# Patient Record
Sex: Male | Born: 1970
Health system: Southern US, Community
[De-identification: ages and names within clinical notes are randomized; demographics above are authoritative.]

## PROBLEM LIST (undated history)

## (undated) DIAGNOSIS — F17200 Nicotine dependence, unspecified, uncomplicated: Secondary | ICD-10-CM

## (undated) DIAGNOSIS — G4730 Sleep apnea, unspecified: Secondary | ICD-10-CM

## (undated) DIAGNOSIS — F1910 Other psychoactive substance abuse, uncomplicated: Secondary | ICD-10-CM

## (undated) DIAGNOSIS — G4700 Insomnia, unspecified: Secondary | ICD-10-CM

## (undated) DIAGNOSIS — G4710 Hypersomnia, unspecified: Secondary | ICD-10-CM

## (undated) DIAGNOSIS — F32A Depression, unspecified: Secondary | ICD-10-CM

## (undated) DIAGNOSIS — F419 Anxiety disorder, unspecified: Secondary | ICD-10-CM

## (undated) HISTORY — DX: Anxiety disorder, unspecified: F41.9

## (undated) HISTORY — DX: Other psychoactive substance abuse, uncomplicated: F19.10

## (undated) HISTORY — DX: Nicotine dependence, unspecified, uncomplicated: F17.200

## (undated) HISTORY — DX: Insomnia, unspecified: G47.00

## (undated) HISTORY — DX: Hypersomnia, unspecified: G47.10

## (undated) HISTORY — PX: KNEE SURGERY: SHX244

## (undated) HISTORY — DX: Sleep apnea, unspecified: G47.30

## (undated) HISTORY — PX: COLONOSCOPY W/ POLYPECTOMY: SHX1380

## (undated) HISTORY — DX: Depression, unspecified: F32.A

---

## 2000-06-03 ENCOUNTER — Emergency Department (HOSPITAL_COMMUNITY): Admission: EM | Admit: 2000-06-03 | Discharge: 2000-06-03 | Payer: Self-pay | Admitting: Emergency Medicine

## 2008-04-14 ENCOUNTER — Encounter: Admission: RE | Admit: 2008-04-14 | Discharge: 2008-04-14 | Payer: Self-pay | Admitting: Emergency Medicine

## 2009-11-18 ENCOUNTER — Encounter: Admission: RE | Admit: 2009-11-18 | Discharge: 2009-11-18 | Payer: Self-pay | Admitting: Internal Medicine

## 2010-10-23 ENCOUNTER — Ambulatory Visit (INDEPENDENT_AMBULATORY_CARE_PROVIDER_SITE_OTHER): Payer: 59 | Admitting: Family Medicine

## 2010-10-23 DIAGNOSIS — H6690 Otitis media, unspecified, unspecified ear: Secondary | ICD-10-CM

## 2011-03-22 ENCOUNTER — Encounter: Payer: Self-pay | Admitting: Family Medicine

## 2011-03-23 ENCOUNTER — Encounter: Payer: Self-pay | Admitting: Family Medicine

## 2011-04-16 ENCOUNTER — Ambulatory Visit (INDEPENDENT_AMBULATORY_CARE_PROVIDER_SITE_OTHER): Payer: 59 | Admitting: Family Medicine

## 2011-04-16 ENCOUNTER — Encounter: Payer: Self-pay | Admitting: Family Medicine

## 2011-04-16 VITALS — BP 122/70 | HR 90 | Wt 260.0 lb

## 2011-04-16 DIAGNOSIS — H109 Unspecified conjunctivitis: Secondary | ICD-10-CM

## 2011-04-16 MED ORDER — ERYTHROMYCIN 5 MG/GM OP OINT: TOPICAL_OINTMENT | Freq: Four times a day (QID) | OPHTHALMIC | Status: AC

## 2011-04-16 NOTE — Patient Instructions (Addendum)
Use the ointment 3 times per day. If no better by the end of the week call me

## 2011-04-16 NOTE — Progress Notes (Signed)
  Subjective:    Patient ID: Kevin Andrade, male    DOB: 1971/01/30, 40 y.o.   MRN: [id number]  HPI He developed difficulty with feeling of a foreign body in his left eye several days ago. He does not remember anything causing this. He does have more difficulty with redness and discomfort.   Review of Systems     Objective:   Physical Exam The left eye conjunctiva is quite injected. Anterior chamber and cornea appear normal. Fluorescein staining was negative.       Assessment & Plan:  Conjunctivitis I will treat with erythromycin ointment.

## 2011-07-05 ENCOUNTER — Other Ambulatory Visit (HOSPITAL_COMMUNITY): Payer: Self-pay | Admitting: Interventional Cardiology

## 2011-07-05 ENCOUNTER — Other Ambulatory Visit: Payer: Self-pay | Admitting: Internal Medicine

## 2011-07-05 DIAGNOSIS — E049 Nontoxic goiter, unspecified: Secondary | ICD-10-CM

## 2011-07-10 ENCOUNTER — Ambulatory Visit (HOSPITAL_COMMUNITY)
Admission: RE | Admit: 2011-07-10 | Discharge: 2011-07-10 | Disposition: A | Discharge status: Home / Self Care | Payer: 59 | Source: Ambulatory Visit | Attending: Internal Medicine | Admitting: Internal Medicine

## 2011-07-10 DIAGNOSIS — E049 Nontoxic goiter, unspecified: Secondary | ICD-10-CM | POA: Insufficient documentation

## 2011-07-30 ENCOUNTER — Encounter: Payer: Self-pay | Admitting: Medical

## 2011-07-30 ENCOUNTER — Ambulatory Visit (INDEPENDENT_AMBULATORY_CARE_PROVIDER_SITE_OTHER): Payer: Commercial Managed Care - PPO | Admitting: Medical

## 2011-07-30 VITALS — BP 110/78 | HR 98 | Temp 98.3°F | Resp 14

## 2011-07-30 DIAGNOSIS — J40 Bronchitis, not specified as acute or chronic: Secondary | ICD-10-CM

## 2011-07-30 DIAGNOSIS — R0789 Other chest pain: Secondary | ICD-10-CM

## 2011-07-30 DIAGNOSIS — R509 Fever, unspecified: Secondary | ICD-10-CM

## 2011-07-30 MED ORDER — AZITHROMYCIN 250 MG PO TABS: ORAL_TABLET | ORAL | Status: AC

## 2011-07-30 NOTE — Patient Instructions (Signed)
Your symptoms are suggestive of a flu like illness.  REST, hydrate well with water, gingerale, Gatorade, etc, use 3-4 tablets of OTC ibuprofen 200mg  every 6-8 hours for fever and aches.  Begin Zpak to help cover for secondary lung infection.    Consider OTC Zycam or Mucinex DM.   Drink some orange juice for vitamin C.  If worse or not improving in 3-5 days, let me know.  Otherwise symptoms take 5-7 days to get significantly better.   Influenza, Adult Influenza ("the flu") is a viral infection of the respiratory tract. It causes chills, fever, cough, headache, body aches, and sore throat. Influenza in general will make you feel sicker than when you have a common cold. Symptoms of the illness typically last a few days. Cough and fatigue may continue for as long as 7 to 10 days. Influenza is highly contagious. It spreads easily to others in the droplets from coughs and sneezes. People frequently become infected by touching something that was recently contaminated with the virus and then touch their mouth, nose or eyes. This infection is caused by a virus. Symptoms will not be reduced or improved by taking an antibiotic. Antibiotics are medications that kill bacteria, not viruses. DIAGNOSIS  Diagnosis of influenza is often made based on the history and physical examination as well as the presence of influenza reports occurring in your community. Testing can be done if the diagnosis is not certain. TREATMENT  Since influenza is caused by a virus, antibiotics are not helpful. Your caregiver may prescribe antiviral medicines to shorten the illness and lessen the severity. Your caregiver may also recommend influenza vaccination and/or antiviral medicines for your family members in order to prevent the spread of influenza to them. HOME CARE INSTRUCTIONS  DO NOT GIVE ASPIRIN TO PERSONS WITH INFLUENZA WHO ARE UNDER 7 YEARS OF AGE. This could lead to brain and liver damage (Reye's syndrome). Read the label on  over-the-counter medicines.   Stay home from work or school if at all possible until most of your symptoms are gone.   Only take over-the-counter or prescription medicines for pain, discomfort, or fever as directed by your caregiver.   Use a cool mist humidifier to increase air moisture. This will make breathing easier.   Rest until your temperature is nearly normal: 98.6 F (37 C). This usually takes 3 to 4 days. Be sure you get plenty of rest.   Drink at least eight, eight-ounce glasses of fluids per day. Fluids include water, juice, broth, gelatin, or lemonade.   Cover your mouth and nose when coughing or sneezing and wash your hands often to prevent the spread of this virus to other persons.  PREVENTION  Annual influenza vaccination (flu shots) is the best way to avoid getting influenza. An annual flu shot is now routinely recommended for all adults in the [place] SEEK MEDICAL CARE IF:   You develop shortness of breath while resting.   You have a deep cough with production of mucous or chest pain.   You develop nausea (feeling sick to your stomach), vomiting, or diarrhea.  SEEK IMMEDIATE MEDICAL CARE IF:   You have difficulty breathing, become short of breath, or your skin or nails turn bluish.   You develop severe neck pain or stiffness.   You develop a severe headache, facial pain, or earache.   You have a fever.   You develop nausea or vomiting that cannot be controlled.  Document Released: 08/10/2000 Document Revised: 04/25/2011 Document Reviewed: 06/15/2009 ExitCare  Patient Information 2012 [place], [place].

## 2011-07-30 NOTE — Progress Notes (Signed)
Subjective:   HPI  Kevin Andrade is a 40 y.o. male who presents for 2 day hx/o abrupt onset of productive cough, green sputum, sore throat, achy all over, alternating fever and chills, fever up to 103.2 yesterday, some difficulty breathing and chest tightness last night, headache.  Using Tylenol and NyQuil for symptoms and fever, last Tylenol was about 8 AM this morning.  Denies sick contacts.   Denies nausea, vomiting, diarrhea, and no sick contacts.  He is a smoker, 24-pack-year history.  No other aggravating or relieving factors.    No other c/o.  The following portions of the patient's history were reviewed and updated as appropriate: allergies, current medications, past family history, past medical history, past social history, past surgical history and problem list.  Past Medical History  Diagnosis Date  . Tobacco use disorder     Review of Systems Constitutional: +fever, +chills, +sweats, -unexpected -weight change,-fatigue ENT: +runny nose, -ear pain, +sore throat Cardiology:  -chest pain, -palpitations, -edema Respiratory: +cough, +shortness of breath, +wheezing Gastroenterology: -abdominal pain, -nausea, -vomiting, -diarrhea, -constipation Hematology: -bleeding or bruising problems Musculoskeletal: -arthralgias, -myalgias, -joint swelling, -back pain Ophthalmology: -vision changes Urology: -dysuria, -difficulty urinating, -hematuria, -urinary frequency, -urgency Neurology: +headache, +weakness, -tingling, -numbness   Objective:      General: Ill-appearing, well-developed, well-nourished Skin: warm, moist HEENT: Nose inflamed and congested, clear conjunctiva, TMs pearly, no sinus tenderness, pharynx with erythema, no exudates Neck: Supple, nontender, shotty cervical adenopathy Heart: Regular rate and rhythm, normal S1, S2, no murmurs Lungs: decreased breath sounds, faint wheezes, but no rales, rhonchi Abdomen: Nontender non distended Extremities: Mild generalized  tenderness      Assessment and Plan:   Encounter Diagnoses  Name Primary?  . Fever Yes  . Bronchitis   . Chest tightness    Lateral chest x-ray shows small amount of patchy congestion, otherwise no acute changes, no cardiomegaly, no other acute process.  We'll send chest x-ray for over read.  Given his symptoms and abrupt onset, will presume he has influenza.   Advised treatment for influenza symptoms as well as secondary bronchitis.  Prescription for Z-Pak for bronchitis.   Supportive care with appropriate antipyretics and fluids. Educational material distributed and questions answered.  discussed usual course of illness, prevention.   Avoid others until completely resolved. Call return if not improving.

## 2012-06-06 ENCOUNTER — Encounter: Payer: Self-pay | Admitting: Family Medicine

## 2012-06-06 ENCOUNTER — Ambulatory Visit (INDEPENDENT_AMBULATORY_CARE_PROVIDER_SITE_OTHER): Payer: 59 | Admitting: Family Medicine

## 2012-06-06 VITALS — BP 130/90 | HR 100 | Ht 73.0 in | Wt 270.0 lb

## 2012-06-06 DIAGNOSIS — Z716 Tobacco abuse counseling: Secondary | ICD-10-CM

## 2012-06-06 DIAGNOSIS — Z23 Encounter for immunization: Secondary | ICD-10-CM

## 2012-06-06 DIAGNOSIS — L989 Disorder of the skin and subcutaneous tissue, unspecified: Secondary | ICD-10-CM

## 2012-06-06 DIAGNOSIS — Z7189 Other specified counseling: Secondary | ICD-10-CM

## 2012-06-06 MED ORDER — VARENICLINE TARTRATE 0.5 MG PO TABS: 0.5000 mg | ORAL_TABLET | Freq: Two times a day (BID) | ORAL | Status: DC

## 2012-06-06 NOTE — Progress Notes (Signed)
  Subjective:    Patient ID: Kevin Andrade, male    DOB: 10-03-1970, 41 y.o.   MRN: [id number]  HPI He is here for consult concerning a lesion present on his left forearm. His wife thinks it has changed colors. He also is interested in quitting smoking. In the past he had tried patches but without success. He indicates that the patches apparently didn't curb his appetite for cigarettes however further discussion with him indicates that he was using the patches to help with stress related reason for smoking.   Review of Systems     Objective:   Physical Exam One and half centimeter brownish lesion with well-demarcated margins and consistent color is noted on the left forearm.       Assessment & Plan:   1. Need for prophylactic vaccination and inoculation against influenza  Flu vaccine greater than or equal to 3yo preservative free IM  2. Skin lesion    3. Encounter for smoking cessation counseling     the lesion was injected with Xylocaine and a 2 mm punch biopsy was taken without difficulty. He will be given a flu shot. Risk and benefits discussed. Discussed smoking cessation in regard to habit versus addiction. Encouraged him to call the 800 number to work more on the true addiction part of this. I will write for him to get [name] starter pack. He is to return here in one month. Also discussed the possibility of him and his wife quitting smoking at the same time and setting up a contest.

## 2012-06-12 ENCOUNTER — Encounter: Payer: Self-pay | Admitting: Family Medicine

## 2012-07-11 ENCOUNTER — Other Ambulatory Visit: Payer: Self-pay

## 2012-07-11 MED ORDER — VARENICLINE TARTRATE 1 MG PO TABS: 1.0000 mg | ORAL_TABLET | Freq: Two times a day (BID) | ORAL | Status: DC

## 2012-07-11 NOTE — Telephone Encounter (Signed)
Pt has apt latter this month

## 2012-10-17 ENCOUNTER — Ambulatory Visit (INDEPENDENT_AMBULATORY_CARE_PROVIDER_SITE_OTHER): Payer: 59 | Admitting: Internal Medicine

## 2012-10-17 VITALS — BP 125/82 | HR 88 | Temp 98.7°F | Resp 18 | Wt 268.0 lb

## 2012-10-17 DIAGNOSIS — F603 Borderline personality disorder: Secondary | ICD-10-CM

## 2012-10-17 DIAGNOSIS — G4700 Insomnia, unspecified: Secondary | ICD-10-CM

## 2012-10-17 DIAGNOSIS — F17200 Nicotine dependence, unspecified, uncomplicated: Secondary | ICD-10-CM

## 2012-10-17 DIAGNOSIS — R4589 Other symptoms and signs involving emotional state: Secondary | ICD-10-CM

## 2012-10-17 DIAGNOSIS — F411 Generalized anxiety disorder: Secondary | ICD-10-CM

## 2012-10-17 MED ORDER — CLONAZEPAM 1 MG PO TABS: 1.0000 mg | ORAL_TABLET | Freq: Three times a day (TID) | ORAL | Status: DC | PRN

## 2012-10-17 MED ORDER — PAROXETINE HCL 20 MG PO TABS: 20.0000 mg | ORAL_TABLET | ORAL | Status: DC

## 2012-10-17 MED ORDER — TRAZODONE HCL 50 MG PO TABS: 50.0000 mg | ORAL_TABLET | Freq: Every day | ORAL | Status: DC

## 2012-10-18 ENCOUNTER — Encounter: Payer: Self-pay | Admitting: Internal Medicine

## 2012-10-18 DIAGNOSIS — F17200 Nicotine dependence, unspecified, uncomplicated: Secondary | ICD-10-CM | POA: Insufficient documentation

## 2012-10-18 DIAGNOSIS — F411 Generalized anxiety disorder: Secondary | ICD-10-CM | POA: Insufficient documentation

## 2012-10-18 NOTE — Progress Notes (Signed)
  Subjective:    Patient ID: Kevin Andrade, male    DOB: [birth year], 43 y.o.   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 Teenage son stable.  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  Alcohol use = no Smokes a pack a day/recognizes the need to quit Sleep history is terrible-hard to fall asleep-wakes frequently-works nights and tries to sleep regularly on the weekend-continues to [employer] to work No suicidal ideation or self injury   Review of Systems No weight loss No fever or night sweats No headaches No vision changes No chest pain or palpitations Diarrhea and stomach cramps during withdrawal    Objective:   Physical Exam BP 125/82  Pulse 88  Temp(Src) 98.7 F (37.1 C) (Oral)  Resp 18  Wt 268 lb (121.564 kg)  BMI 35.37 kg/m2 HEENT clear No thyromegaly Mood very concerned/affect appropriate Judgment intact No pressured speech or delirium       Assessment & Plan:  GAD (generalized anxiety disorder) - Insomnia - Dysphoric mood - Nicotine addiction Opiate misuse Gastrointestinal withdrawal effects  Meds ordered this encounter  Medications  . traZODone (DESYREL) 50 MG tablet    Sig: Take 1 tablet (50 mg total) by mouth at bedtime.    Dispense:  30 tablet    Refill:  1  . PARoxetine (PAXIL) 20 MG tablet    Sig: Take 1 tablet (20 mg total) by mouth every morning.    Dispense:  30 tablet    Refill:  1  . clonazePAM (KLONOPIN) 1 MG tablet    Sig: Take 1 mg  by mouth 3 (three) times daily as needed for anxiety.  Followup 3 weeks Will consider counseling

## 2012-11-23 ENCOUNTER — Ambulatory Visit (INDEPENDENT_AMBULATORY_CARE_PROVIDER_SITE_OTHER): Payer: 59 | Admitting: Internal Medicine

## 2012-11-23 VITALS — BP 127/85 | HR 83 | Temp 98.0°F | Resp 18 | Ht 72.0 in | Wt 266.8 lb

## 2012-11-23 DIAGNOSIS — F411 Generalized anxiety disorder: Secondary | ICD-10-CM

## 2012-11-23 DIAGNOSIS — R4589 Other symptoms and signs involving emotional state: Secondary | ICD-10-CM

## 2012-11-23 DIAGNOSIS — G4700 Insomnia, unspecified: Secondary | ICD-10-CM | POA: Insufficient documentation

## 2012-11-23 DIAGNOSIS — F603 Borderline personality disorder: Secondary | ICD-10-CM

## 2012-11-23 DIAGNOSIS — F17200 Nicotine dependence, unspecified, uncomplicated: Secondary | ICD-10-CM

## 2012-11-23 MED ORDER — TRAZODONE HCL 50 MG PO TABS: 50.0000 mg | ORAL_TABLET | Freq: Every day | ORAL | Status: DC

## 2012-11-23 MED ORDER — PAROXETINE HCL 40 MG PO TABS: 40.0000 mg | ORAL_TABLET | ORAL | Status: DC

## 2012-11-23 MED ORDER — CLONAZEPAM 1 MG PO TABS: 1.0000 mg | ORAL_TABLET | Freq: Three times a day (TID) | ORAL | Status: DC | PRN

## 2012-11-23 NOTE — Progress Notes (Signed)
  Subjective:    Patient ID: Kevin Andrade, male    DOB: 16-Sep-1970, 42 y.o.   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[employer] and sleeps during the day/has to travel to work every week/continues to have significant anxiety particularly at work [employer] half x1 mg barely controls this/he has been afraid to take a whole tablet in case he could not work/he perceives work is very very stressful/does better when home on the weekends Paxil 20 mg has done little to decrease his overall level of anxiety  Continues to smoke Unable to lose weight    Review of Systems    Objective:   Physical Exam Vital signs stable Mood is good though anxious affect is appropriate Thoughts well-controlled No flights of ideas  No depression Mady Gemma /despair         Assessment & Plan:    Problem #1 generalize anxiety disorder Problem #2 insomnia secondary Problem #3 nicotine addiction Problem #4 overweight  Plan Increase Paxil to 40 mg Continue Klonopin 1 mg Continue trazodone half x 50  followup 3 months Will be concerted program to stop smoking once anxiety controlled  Meds ordered this encounter  Medications  . PARoxetine (PAXIL) 40 MG tablet    Sig: Take 1 tablet (40 mg total) by mouth every morning.    Dispense:  30 tablet    Refill:  2  . traZODone (DESYREL) 50 MG tablet    Sig: Take 1 tablet (50 mg total) by mouth at bedtime.    Dispense:  30 tablet    Refill:  1  . clonazePAM (KLONOPIN) 1 MG tablet    Sig: Take 1 tablet (1 mg total) by mouth 3 (three) times daily as needed for anxiety.    Dispense:  90 tablet    Refill:  2

## 2013-04-07 ENCOUNTER — Other Ambulatory Visit: Payer: Self-pay | Admitting: Internal Medicine

## 2013-05-01 ENCOUNTER — Ambulatory Visit (INDEPENDENT_AMBULATORY_CARE_PROVIDER_SITE_OTHER): Payer: 59 | Admitting: Internal Medicine

## 2013-05-01 VITALS — BP 120/76 | HR 74 | Temp 98.2°F | Resp 16 | Ht 74.0 in | Wt 282.2 lb

## 2013-05-01 DIAGNOSIS — G4710 Hypersomnia, unspecified: Secondary | ICD-10-CM

## 2013-05-01 DIAGNOSIS — R635 Abnormal weight gain: Secondary | ICD-10-CM

## 2013-05-01 DIAGNOSIS — G4700 Insomnia, unspecified: Secondary | ICD-10-CM

## 2013-05-01 LAB — COMPREHENSIVE METABOLIC PANEL
ALT: 30 U/L (ref 0–53)
AST: 25 U/L (ref 0–37)
Calcium: 8.8 mg/dL (ref 8.4–10.5)
Chloride: 105 mEq/L (ref 96–112)
Sodium: 138 mEq/L (ref 135–145)
Total Bilirubin: 0.4 mg/dL (ref 0.3–1.2)

## 2013-05-01 LAB — POCT CBC
Granulocyte percent: 65.4 %G (ref 37–80)
Hemoglobin: 14 g/dL — AB (ref 14.1–18.1)
MCV: 94.2 fL (ref 80–97)
MID (cbc): 0.5 (ref 0–0.9)
MPV: 10.3 fL (ref 0–99.8)
POC MID %: 9.1 %M (ref 0–12)
Platelet Count, POC: 258 10*3/uL (ref 142–424)
RBC: 4.65 M/uL — AB (ref 4.69–6.13)

## 2013-05-01 LAB — TSH: TSH: 1.278 u[IU]/mL (ref 0.350–4.500)

## 2013-05-01 MED ORDER — PAROXETINE HCL 40 MG PO TABS: 40.0000 mg | ORAL_TABLET | ORAL | Status: DC

## 2013-05-01 MED ORDER — TRAZODONE HCL 50 MG PO TABS: 50.0000 mg | ORAL_TABLET | Freq: Every day | ORAL | Status: DC

## 2013-05-01 MED ORDER — CLONAZEPAM 1 MG PO TABS: 1.0000 mg | ORAL_TABLET | Freq: Three times a day (TID) | ORAL | Status: DC | PRN

## 2013-05-03 ENCOUNTER — Encounter: Payer: Self-pay | Admitting: Internal Medicine

## 2013-05-03 NOTE — Progress Notes (Signed)
  Subjective:    Patient ID: Kevin Andrade, male    DOB: [birth year], 42 y.o.   MRN: [id number]  HPIf/u Patient Active Problem List   Diagnosis Date Noted  . GAD (generalized anxiety disorder) 10/18/2012    Priority: Medium  . Insomnia 11/23/2012  . Nicotine addiction 10/18/2012   anxiety stable but having lots of trouble with fatigue and hypersomnolence He is having increased congestion His wife has noticed periods of apnea during sleep He is unaware wakening He has had significant weight gain over the summer almost 20 pounds Denies excessive eating Travels long distance to work each week, working evening shifts Has trouble falling asleep   Review of Systems No headaches or vision changes No chest pain or palpitations no shortness of breath No edema   no excessive coldness  Objective:   Physical Exam BP 120/76  Pulse 74  Temp(Src) 98.2 F (36.8 C) (Oral)  Resp 16  Ht 6\' 2"  (1.88 m)  Wt 282 lb 3.2 oz (128.005 kg)  BMI 36.22 kg/m2  SpO2 94% No acute distress HEENT clear except nasal congestion Heart regular without murmur Lungs clear No peripheral edema Mood good      Assessment & Plan:  Hypersomnia with sleep apnea, unspecified - Plan: POCT CBC, Comprehensive metabolic panel, Ambulatory referral to Sleep Studies  Weight gain, abnormal - Plan: TSH  Insomnia - Plan: traZODone (DESYREL) 50 MG tablet  Generalized anxiety disorder  Meds ordered this encounter  Medications  . clonazePAM (KLONOPIN) 1 MG tablet    Sig: Take 1 tablet (1 mg total) by mouth 3 (three) times daily as needed for anxiety.    Dispense:  90 tablet    Refill:  5  . PARoxetine (PAXIL) 40 MG tablet    Sig: Take 1 tablet (40 mg total) by mouth every morning.    Dispense:  90 tablet    Refill:  3  . traZODone (DESYREL) 50 MG tablet    Sig: Take 1 tablet (50 mg total) by mouth at bedtime.    Dispense:  90 tablet    Refill:  3

## 2013-05-06 ENCOUNTER — Telehealth: Payer: Self-pay

## 2013-05-06 NOTE — Telephone Encounter (Signed)
[name] STATES HER HUSBAND HAD LAB WORK DONE AND SHE WOULD LIKE RESULTS. PLEASE CALL [phone]. IS ALSO ON HIS HIPPA

## 2013-05-06 NOTE — Telephone Encounter (Signed)
Labs normal letter was mailed. Called her to advise.  

## 2013-05-15 ENCOUNTER — Ambulatory Visit (INDEPENDENT_AMBULATORY_CARE_PROVIDER_SITE_OTHER): Payer: 59 | Admitting: Family Medicine

## 2013-05-15 DIAGNOSIS — L03019 Cellulitis of unspecified finger: Secondary | ICD-10-CM

## 2013-05-15 DIAGNOSIS — L03012 Cellulitis of left finger: Secondary | ICD-10-CM

## 2013-05-15 NOTE — Progress Notes (Signed)
  Subjective:    Patient ID: Kevin Andrade, male    DOB: [birth year], 42 y.o.   MRN: [id number]  HPI He injured his left large finger approximately 2 weeks ago and a crushtype injury at the nail bed. Approximately one week ago he noted whitish discoloration underneath the nail and was able to express some purulent material. He is here for followup on that.   Review of Systems     Objective:   Physical Exam The left large fingernail does appear to have whitish fluid underneath it. A small hole was burned through the nail with a paper clip. No drainage was noted.       Assessment & Plan:  Paronychia of finger of left hand  recommend he keep the area covered. Did explain he will most likely lose the nail. If he has further swelling and discoloration, he should call.

## 2013-05-18 ENCOUNTER — Ambulatory Visit: Payer: 59 | Admitting: Family Medicine

## 2013-05-22 ENCOUNTER — Telehealth: Payer: Self-pay

## 2013-05-22 ENCOUNTER — Institutional Professional Consult (permissible substitution): Payer: 59 | Admitting: Neurology

## 2013-05-22 NOTE — Telephone Encounter (Signed)
I called patient and spoke with his wife. Provider unavailable today. Appointment rescheduled for Friday May 29, 2013 at 11:30 a.m.

## 2013-05-29 ENCOUNTER — Encounter: Payer: Self-pay | Admitting: Neurology

## 2013-05-29 ENCOUNTER — Other Ambulatory Visit (INDEPENDENT_AMBULATORY_CARE_PROVIDER_SITE_OTHER): Payer: 59

## 2013-05-29 ENCOUNTER — Ambulatory Visit (INDEPENDENT_AMBULATORY_CARE_PROVIDER_SITE_OTHER): Payer: 59 | Admitting: Neurology

## 2013-05-29 VITALS — BP 129/77 | HR 69 | Temp 98.9°F | Resp 18 | Ht 73.0 in | Wt 274.0 lb

## 2013-05-29 DIAGNOSIS — G4700 Insomnia, unspecified: Secondary | ICD-10-CM

## 2013-05-29 DIAGNOSIS — G4710 Hypersomnia, unspecified: Secondary | ICD-10-CM | POA: Insufficient documentation

## 2013-05-29 DIAGNOSIS — G4730 Sleep apnea, unspecified: Secondary | ICD-10-CM

## 2013-05-29 DIAGNOSIS — E669 Obesity, unspecified: Secondary | ICD-10-CM

## 2013-05-29 DIAGNOSIS — R0609 Other forms of dyspnea: Secondary | ICD-10-CM

## 2013-05-29 DIAGNOSIS — R0683 Snoring: Secondary | ICD-10-CM

## 2013-05-29 DIAGNOSIS — Z23 Encounter for immunization: Secondary | ICD-10-CM

## 2013-05-29 NOTE — Patient Instructions (Signed)
Polysomnography (Sleep Studies) Polysomnography (PSG) is a series of tests used for detecting (diagnosing) obstructive sleep apnea and other sleep disorders. The tests measure how some parts of your body are working while you are sleeping. The tests are extensive and expensive. They are done in a sleep lab or hospital, and vary from center to center. Your caregiver may perform other more simple sleep studies and questionnaires before doing more complete and involved testing. Testing may not be covered by insurance. Some of these tests are:  An EEG (Electroencephalogram). This tests your brain waves and stages of sleep.  An EOG (Electrooculogram). This measures the movements of your eyes. It detects periods of REM (rapid eye movement) sleep, which is your dream sleep.  An EKG (Electrocardiogram). This measures your heart rhythm.  EMG (Electromyography). This is a measurement of how the muscles are working in your upper airway and your legs while sleeping.  An oximetry measurement. It measures how much oxygen (air) you are getting while sleeping.  Breathing efforts may be measured. The same test can be interpreted (understood) differently by different caregivers and centers that study sleep.  Studies may be given an apnea/hypopnea index (AHI). This is a number which is found by counting the times of no breathing or under breathing during the night, and relating those numbers to the amount of time spent in bed. When the AHI is greater than 15, the patient is likely to complain of daytime sleepiness. When the AHI is greater than 30, the patient is at increased risk for heart problems and must be followed more closely. Following the AHI also allows you to know how treatment is working. Simple oximetry (tracking the amount of oxygen that is taken in) can be used for screening patients who:  Do not have symptoms (problems) of OSA.  Have a normal Epworth Sleepiness Scale Score.  Have a low pre-test  probability of having OSA.  Have none of the upper airway problems likely to cause apnea.  Oximetry is also used to determine if treatment is effective in patients who showed significant desaturations (not getting enough oxygen) on their home sleep study. One extra measure of safety is to perform additional studies for the person who only snores. This is because no one can predict with absolute certainty who will have OSA. Those who show significant desaturations (not getting enough oxygen) are recommended to have a more detailed sleep study. Document Released: 02/17/2003 Document Revised: 11/05/2011 Document Reviewed: 08/13/2005 [facility] Patient Information 2014 [place], [place]. Sleep Apnea  Sleep apnea is a sleep disorder characterized by abnormal pauses in breathing while you sleep. When your breathing pauses, the level of oxygen in your blood decreases. This causes you to move out of deep sleep and into light sleep. As a result, your quality of sleep is poor, and the system that carries your blood throughout your body (cardiovascular system) experiences stress. If sleep apnea remains untreated, the following conditions can develop:  High blood pressure (hypertension).  Coronary artery disease.  Inability to achieve or maintain an erection (impotence).  Impairment of your thought process (cognitive dysfunction). There are three types of sleep apnea: 1. Obstructive sleep apnea Pauses in breathing during sleep because of a blocked airway. 2. Central sleep apnea Pauses in breathing during sleep because the area of the brain that controls your breathing does not send the correct signals to the muscles that control breathing. 3. Mixed sleep apnea A combination of both obstructive and central sleep apnea. RISK FACTORS The following  risk factors can increase your risk of developing sleep apnea:  Being overweight.  Smoking.  Having narrow passages in your nose and throat.  Being of older  age.  Being male.  Alcohol use.  Sedative and tranquilizer use.  Ethnicity. Among individuals younger than 35 years, African Americans are at increased risk of sleep apnea. SYMPTOMS   Difficulty staying asleep.  Daytime sleepiness and fatigue.  Loss of energy.  Irritability.  Loud, heavy snoring.  Morning headaches.  Trouble concentrating.  Forgetfulness.  Decreased interest in sex. DIAGNOSIS  In order to diagnose sleep apnea, your caregiver will perform a physical examination. Your caregiver may suggest that you take a home sleep test. Your caregiver may also recommend that you spend the night in a sleep lab. In the sleep lab, several monitors record information about your heart, lungs, and brain while you sleep. Your leg and arm movements and blood oxygen level are also recorded. TREATMENT The following actions may help to resolve mild sleep apnea:  Sleeping on your side.   Using a decongestant if you have nasal congestion.   Avoiding the use of depressants, including alcohol, sedatives, and narcotics.   Losing weight and modifying your diet if you are overweight. There also are devices and treatments to help open your airway:  Oral appliances. These are custom-made mouthpieces that shift your lower jaw forward and slightly open your bite. This opens your airway.  Devices that create positive airway pressure. This positive pressure "splints" your airway open to help you breathe better during sleep. The following devices create positive airway pressure:  Continuous positive airway pressure (CPAP) device. The CPAP device creates a continuous level of air pressure with an air pump. The air is delivered to your airway through a mask while you sleep. This continuous pressure keeps your airway open.  Nasal expiratory positive airway pressure (EPAP) device. The EPAP device creates positive air pressure as you exhale. The device consists of single-use valves, which are  inserted into each nostril and held in place by adhesive. The valves create very little resistance when you inhale but create much more resistance when you exhale. That increased resistance creates the positive airway pressure. This positive pressure while you exhale keeps your airway open, making it easier to breath when you inhale again.  Bilevel positive airway pressure (BPAP) device. The BPAP device is used mainly in patients with central sleep apnea. This device is similar to the CPAP device because it also uses an air pump to deliver continuous air pressure through a mask. However, with the BPAP machine, the pressure is set at two different levels. The pressure when you exhale is lower than the pressure when you inhale.  Surgery. Typically, surgery is only done if you cannot comply with less invasive treatments or if the less invasive treatments do not improve your condition. Surgery involves removing excess tissue in your airway to create a wider passage way. Document Released: 08/03/2002 Document Revised: 02/12/2012 Document Reviewed: 12/20/2011 [facility] Patient Information 2014 [place], [place]. Smoking Cessation, Tips for Success YOU CAN QUIT SMOKING If you are ready to quit smoking, congratulations! You have chosen to help yourself be healthier. Cigarettes bring nicotine, tar, carbon monoxide, and other irritants into your body. Your lungs, heart, and blood vessels will be able to work better without these poisons. There are many different ways to quit smoking. Nicotine gum, nicotine patches, a nicotine inhaler, or nicotine nasal spray can help with physical craving. Hypnosis, support groups, and medicines help break  the habit of smoking. Here are some tips to help you quit for good.  Throw away all cigarettes.  Clean and remove all ashtrays from your home, work, and car.  On a card, write down your reasons for quitting. Carry the card with you and read it when you get the urge to  smoke.  Cleanse your body of nicotine. Drink enough water and fluids to keep your urine clear or pale yellow. Do this after quitting to flush the nicotine from your body.  Learn to predict your moods. Do not let a bad situation be your excuse to have a cigarette. Some situations in your life might tempt you into wanting a cigarette.  Never have "just one" cigarette. It leads to wanting another and another. Remind yourself of your decision to quit.  Change habits associated with smoking. If you smoked while driving or when feeling stressed, try other activities to replace smoking. Stand up when drinking your coffee. Brush your teeth after eating. Sit in a different chair when you read the paper. Avoid alcohol while trying to quit, and try to drink fewer caffeinated beverages. Alcohol and caffeine may urge you to smoke.  Avoid foods and drinks that can trigger a desire to smoke, such as sugary or spicy foods and alcohol.  Ask people who smoke not to smoke around you.  Have something planned to do right after eating or having a cup of coffee. Take a walk or exercise to perk you up. This will help to keep you from overeating.  Try a relaxation exercise to calm you down and decrease your stress. Remember, you may be tense and nervous for the first 2 weeks after you quit, but this will pass.  Find new activities to keep your hands busy. Play with a pen, coin, or rubber band. Doodle or draw things on paper.  Brush your teeth right after eating. This will help cut down on the craving for the taste of tobacco after meals. You can try mouthwash, too.  Use oral substitutes, such as lemon drops, carrots, a cinnamon stick, or chewing gum, in place of cigarettes. Keep them handy so they are available when you have the urge to smoke.  When you have the urge to smoke, try deep breathing.  Designate your home as a nonsmoking area.  If you are a heavy smoker, ask your caregiver about a prescription for  nicotine chewing gum. It can ease your withdrawal from nicotine.  Reward yourself. Set aside the cigarette money you save and buy yourself something nice.  Look for support from others. Join a support group or smoking cessation program. Ask someone at home or at work to help you with your plan to quit smoking.  Always ask yourself, "Do I need this cigarette or is this just a reflex?" Tell yourself, "Today, I choose not to smoke," or "I do not want to smoke." You are reminding yourself of your decision to quit, even if you do smoke a cigarette. HOW WILL I FEEL WHEN I QUIT SMOKING?  The benefits of not smoking start within days of quitting.  You may have symptoms of withdrawal because your body is used to nicotine (the addictive substance in cigarettes). You may crave cigarettes, be irritable, feel very hungry, cough often, get headaches, or have difficulty concentrating.  The withdrawal symptoms are only temporary. They are strongest when you first quit but will go away within 10 to 14 days.  When withdrawal symptoms occur, stay in control. Think  about your reasons for quitting. Remind yourself that these are signs that your body is healing and getting used to being without cigarettes.  Remember that withdrawal symptoms are easier to treat than the major diseases that smoking can cause.  Even after the withdrawal is over, expect periodic urges to smoke. However, these cravings are generally short-lived and will go away whether you smoke or not. Do not smoke!  If you relapse and smoke again, do not lose hope. Most smokers quit 3 times before they are successful.  If you relapse, do not give up! Plan ahead and think about what you will do the next time you get the urge to smoke. LIFE AS A NONSMOKER: MAKE IT FOR A MONTH, MAKE IT FOR LIFE Day 1: Hang this page where you will see it every day. Day 2: Get rid of all ashtrays, matches, and lighters. Day 3: Drink water. Breathe deeply between  sips. Day 4: Avoid places with smoke-filled air, such as bars, clubs, or the smoking section of restaurants. Day 5: Keep track of how much money you save by not smoking. Day 6: Avoid boredom. Keep a good book with you or go to the movies. Day 7: Reward yourself! One week without smoking! Day 8: Make a dental appointment to get your teeth cleaned. Day 9: Decide how you will turn down a cigarette before it is offered to you. Day 10: Review your reasons for quitting. Day 11: Distract yourself. Stay active to keep your mind off smoking and to relieve tension. Take a walk, exercise, read a book, do a crossword puzzle, or try a new hobby. Day 12: Exercise. Get off the bus before your stop or use stairs instead of escalators. Day 13: Call on friends for support and encouragement. Day 14: Reward yourself! Two weeks without smoking! Day 15: Practice deep breathing exercises. Day 16: Bet a friend that you can stay a nonsmoker. Day 17: Ask to sit in nonsmoking sections of restaurants. Day 18: Hang up "No Smoking" signs. Day 19: Think of yourself as a nonsmoker. Day 20: Each morning, tell yourself you will not smoke. Day 21: Reward yourself! Three weeks without smoking! Day 22: Think of smoking in negative ways. Remember how it stains your teeth, gives you bad breath, and leaves you short of breath. Day 23: Eat a nutritious breakfast. Day 24:Do not relive your days as a smoker. Day 25: Hold a pencil in your hand when talking on the telephone. Day 26: Tell all your friends you do not smoke. Day 27: Think about how much better food tastes. Day 28: Remember, one cigarette is one too many. Day 29: Take up a hobby that will keep your hands busy. Day 30: Congratulations! One month without smoking! Give yourself a big reward. Your caregiver can direct you to community resources or hospitals for support, which may include:  Group support.  Education.  Hypnosis.  Subliminal therapy. Document  Released: 05/11/2004 Document Revised: 11/05/2011 Document Reviewed: 05/30/2009 [facility] Patient Information 2014 [place], [place].

## 2013-05-29 NOTE — Progress Notes (Signed)
[facility]  Provider:  Melvyn Novas, M D  Referring Provider: Tonye Pearson, MD Primary Care Physician:  Tonye Pearson, MD  Chief Complaint  Patient presents with  . Snoring    HPI:  Kevin Andrade is a 42 y.o. male  Is seen here as a referral/ revisit  from Dr. Merla Riches for  Evaluation of Restless legs, insomnia and snoring.  [name] Works out of town and works irregular hours, no defined schedule. Earlier this year he had approached Dr. Merla Riches and have explained to him that he had trouble initiating sleep and also to sleep through the night. He was given clonazepam as a medication to help with restless legs as well as was insomnia. It appears to have helped temporarily, does not need to take the medication during the day for anxiety or other reasons, he strip he uses it at night. He had also been prescribed Paxil and Desyrel .Desyrel has been discontinued a couple of weeks ago and he feels no beneficial effect to stay on it.  Paroxetine can worsen restless legs , but the patient reports having had a restless sleep and restless limbs before Paxil was initiated.  Since he works at [employer] out of town and has to drive for longer distances , he often states there for the whole week.  His sleep habits are therefore irregular there are no defined bedtimes rise times etc.  A four  hour average sleep duration per night is is stated, he can get up to 8 hours if at home and not on "stand by" .   He does not have nocturia, he does waken with a dry mouth, sometimes with headaches. He has been noted to snore very loudly . He also has had trouble catching his  breath or choking for air. He drinks caffeine  In AM , rarely later after Lunch.   He denies having sleep attacks, but falls asleep when not physicially active . Epworth 18 /24.    Family history negative for sleep disorders. The patient has no history of trauma or surgeries to the upper airway, facial  skull, nose etc. He has no history of childhood sleep disorders, he feels that hypersomnia has really been a problem for about 3 months. Insomnia for 9 month .  Review of Systems: Out of a complete 14 system review, the patient complains of only the following symptoms, and all other reviewed systems are negative.   His wife has noticed his periods of apnea. And loudly snoring has been witnessed by co workers. He endorses the  Epworth Sleepiness Scale today at 18 points and the fatigue severity score at 59 points.  Again, he is treated for depression-anxiety.  He also suffers from nasal congestion sinusitis and sometimes a sore throat., But he has not yet been able to quit smoking.      History   Social History  . Marital Status: Married    Spouse Name: [name]    Number of Children: 2  . Years of Education: HS   Occupational History  . Not on file.   Social History Main Topics  . Smoking status: Current Every Day Smoker -- 2.00 packs/day for 20 years    Types: Cigarettes  . Smokeless tobacco: Former Neurosurgeon  . Alcohol Use: No  . Drug Use: No  . Sexual Activity: Yes   Other Topics Concern  . Not on file   Social History Narrative   Patient lives at home with  spouse.   Caffeine Use: 2 L daily    History reviewed. No pertinent family history.  Past Medical History  Diagnosis Date  . Tobacco use disorder   . Anxiety   . Substance abuse   . Insomnia with sleep apnea   . Hypersomnia, persistent     History reviewed. No pertinent past surgical history.  Current Outpatient Prescriptions  Medication Sig Dispense Refill  . clonazePAM (KLONOPIN) 1 MG tablet Take 1 tablet (1 mg total) by mouth 3 (three) times daily as needed for anxiety.  90 tablet  5  . PARoxetine (PAXIL) 40 MG tablet Take 1 tablet (40 mg total) by mouth every morning.  90 tablet  3  . traZODone (DESYREL) 50 MG tablet Take 1 tablet (50 mg total) by mouth at bedtime.  90 tablet  3   No current  facility-administered medications for this visit.    Allergies as of 05/29/2013  . (No Known Allergies)    Vitals: BP 129/77  Pulse 69  Temp(Src) 98.9 F (37.2 C) (Oral)  Resp 18  Ht 6\' 1"  (1.854 m)  Wt 274 lb (124.286 kg)  BMI 36.16 kg/m2 Last Weight:  Wt Readings from Last 1 Encounters:  05/29/13 274 lb (124.286 kg)   Last Height:   Ht Readings from Last 1 Encounters:  05/29/13 6\' 1"  (1.854 m)    Physical exam:  General: The patient is awake, alert and appears not in acute distress. The patient is well groomed. Head: Normocephalic, atraumatic. Neck is supple. Mallampati 4 , neck circumference: 17.75. ,  No retrognathia.  Cardiovascular:  Regular rate and rhythm , without  murmurs or carotid bruit, and without distended neck veins. Respiratory: Lungs are clear to auscultation. Skin:  Without evidence of edema, or rash Trunk: BMI is elevated . The patient  has normal posture, is muscular and has no scoliosis..  Neurologic exam : The patient is awake and alert, oriented to place and time.  Memory subjective   described as intact. There is a normal attention span & concentration ability. Speech is fluent without  dysarthria, dysphonia or aphasia. Mood and affect are appropriate.  Cranial nerves: Pupils are equal and briskly reactive to light. Funduscopic exam without   evidence of pallor or edema. Extraocular movements  in vertical and horizontal planes intact and without nystagmus. Visual fields by finger perimetry are intact. Hearing to finger rub intact.  Facial sensation intact to fine touch. Facial motor strength is symmetric and tongue and uvula move midline.  Motor exam:   Normal tone and normal muscle bulk and symmetric normal strength in all extremities.  Sensory:  Fine touch, pinprick and vibration were tested in all extremities. Proprioception isnormal.  Coordination: Rapid alternating movements normal without evidence of ataxia, dysmetria or tremor.  Gait and  station: Patient walks without assistive device. Deep tendon reflexes: in the  upper and lower extremities are symmetric and intact. Babinski maneuver downgoing.   Assessment:  After physical and neurologic examination, review of laboratory studies and pre-existing records, assessment is  That of a patient with high risk for OSA, based on  BMI, Mallompati and neck size. Clinical symptoms  Support the suspected OSA .   Plan:  Treatment plan and additional workup : Split study ASAP- Epworth 18 and HTN, insomnia. The EDS can affect this patients ability to drive and  operate machinery.  BMI reduction by exercise and carb reduced diet .  Lower carb intake is necessary, he lives on fast food.  Drink water .

## 2013-06-08 ENCOUNTER — Telehealth: Payer: Self-pay | Admitting: Neurology

## 2013-06-08 NOTE — Telephone Encounter (Signed)
Pt was scheduled for a sleep study on 06/07/2013 @ 9:30pm and was a no-show.  Called the patient and left a message on his voicemail requesting a call back to our office for rescheduling.

## 2014-08-11 ENCOUNTER — Other Ambulatory Visit: Payer: Self-pay | Admitting: Internal Medicine

## 2014-08-11 DIAGNOSIS — E042 Nontoxic multinodular goiter: Secondary | ICD-10-CM

## 2014-08-25 ENCOUNTER — Ambulatory Visit (HOSPITAL_COMMUNITY)
Admission: RE | Admit: 2014-08-25 | Discharge: 2014-08-25 | Disposition: A | Discharge status: Home / Self Care | Payer: 59 | Source: Ambulatory Visit | Attending: Internal Medicine | Admitting: Internal Medicine

## 2014-08-25 DIAGNOSIS — E042 Nontoxic multinodular goiter: Secondary | ICD-10-CM | POA: Diagnosis not present

## 2014-08-28 ENCOUNTER — Encounter: Payer: Self-pay | Admitting: Internal Medicine

## 2014-09-01 ENCOUNTER — Encounter: Payer: Self-pay | Admitting: Internal Medicine

## 2014-09-01 ENCOUNTER — Ambulatory Visit (INDEPENDENT_AMBULATORY_CARE_PROVIDER_SITE_OTHER): Payer: 59 | Admitting: Internal Medicine

## 2014-09-01 VITALS — BP 122/80 | HR 90 | Temp 98.7°F | Resp 18 | Ht 72.0 in | Wt 283.0 lb

## 2014-09-01 DIAGNOSIS — Z0000 Encounter for general adult medical examination without abnormal findings: Secondary | ICD-10-CM

## 2014-09-01 DIAGNOSIS — Z23 Encounter for immunization: Secondary | ICD-10-CM

## 2014-09-01 DIAGNOSIS — Z13220 Encounter for screening for lipoid disorders: Secondary | ICD-10-CM

## 2014-09-01 DIAGNOSIS — F17210 Nicotine dependence, cigarettes, uncomplicated: Secondary | ICD-10-CM

## 2014-09-01 DIAGNOSIS — G4710 Hypersomnia, unspecified: Secondary | ICD-10-CM

## 2014-09-01 DIAGNOSIS — F411 Generalized anxiety disorder: Secondary | ICD-10-CM

## 2014-09-01 DIAGNOSIS — E042 Nontoxic multinodular goiter: Secondary | ICD-10-CM

## 2014-09-01 LAB — CBC WITH DIFFERENTIAL/PLATELET
Basophils Absolute: 0.1 10*3/uL (ref 0.0–0.1)
Basophils Relative: 1 % (ref 0–1)
EOS ABS: 0.1 10*3/uL (ref 0.0–0.7)
EOS PCT: 2 % (ref 0–5)
HEMATOCRIT: 45.4 % (ref 39.0–52.0)
HEMOGLOBIN: 15.7 g/dL (ref 13.0–17.0)
LYMPHS ABS: 1.3 10*3/uL (ref 0.7–4.0)
Lymphocytes Relative: 25 % (ref 12–46)
MCH: 30.7 pg (ref 26.0–34.0)
MCHC: 34.6 g/dL (ref 30.0–36.0)
MCV: 88.7 fL (ref 78.0–100.0)
MPV: 10.8 fL (ref 8.6–12.4)
Monocytes Absolute: 0.5 10*3/uL (ref 0.1–1.0)
Monocytes Relative: 10 % (ref 3–12)
NEUTROS ABS: 3.3 10*3/uL (ref 1.7–7.7)
NEUTROS PCT: 62 % (ref 43–77)
Platelets: 292 10*3/uL (ref 150–400)
RBC: 5.12 MIL/uL (ref 4.22–5.81)
RDW: 13.8 % (ref 11.5–15.5)
WBC: 5.3 10*3/uL (ref 4.0–10.5)

## 2014-09-01 LAB — COMPREHENSIVE METABOLIC PANEL
ALT: 46 U/L (ref 0–53)
AST: 31 U/L (ref 0–37)
Albumin: 4.4 g/dL (ref 3.5–5.2)
Alkaline Phosphatase: 76 U/L (ref 39–117)
BILIRUBIN TOTAL: 0.6 mg/dL (ref 0.2–1.2)
BUN: 9 mg/dL (ref 6–23)
CALCIUM: 9.2 mg/dL (ref 8.4–10.5)
CHLORIDE: 103 meq/L (ref 96–112)
CO2: 27 mEq/L (ref 19–32)
CREATININE: 0.86 mg/dL (ref 0.50–1.35)
GLUCOSE: 86 mg/dL (ref 70–99)
POTASSIUM: 4.5 meq/L (ref 3.5–5.3)
Sodium: 138 mEq/L (ref 135–145)
Total Protein: 6.8 g/dL (ref 6.0–8.3)

## 2014-09-01 LAB — LIPID PANEL
Cholesterol: 187 mg/dL (ref 0–200)
HDL: 43 mg/dL (ref >39)
LDL CALC: 97 mg/dL (ref 0–99)
Total CHOL/HDL Ratio: 4.3 Ratio
Triglycerides: 236 mg/dL — ABNORMAL HIGH (ref <150)
VLDL: 47 mg/dL — AB (ref 0–40)

## 2014-09-01 LAB — TSH: TSH: 1.569 u[IU]/mL (ref 0.350–4.500)

## 2014-09-01 LAB — T4, FREE: FREE T4: 1.26 ng/dL (ref 0.80–1.80)

## 2014-09-01 NOTE — Progress Notes (Signed)
Subjective:    Patient ID: Kevin Andrade, male    DOB: [birth year], 44 y.o.   MRN: [id number]  HPIcpe Patient Active Problem List   Diagnosis Date Noted  . GAD (generalized anxiety disorder) 10/18/2012    Priority: Medium  . Multinodular goiter--- recent ultrasound  IMPRESSION: Stable benign appearing multinodular goiter. Since the prior study the thyroid parenchyma is stable in volume but does appear to be slightly more heterogeneous in appearance which may reflect underlying chronic thyroiditis. Correlation suggested with thyroid function tests. 08/11/2014  . Insomnia with sleep apnea--- see neurology evaluation 2014 ///for some unknown reason his symptoms have resolved and he sleeps better and even his wife concurs    . Hypersomnia, persistent--much improved    . Insomnia 11/23/2012  . Nicotine addiction--continues to be unable to quit smoking - 10/18/2012    -  Overweight    -  Travels to work    -  Occasional backaches after work requiring pain meds but never truly evaluated because always short-lived   Prior to Admission medications   Medication Sig Start Date End Date Taking? Authorizing Provider  clonazePAM (KLONOPIN) 1 MG tablet Take 1 tablet (1 mg total) by mouth 3 (three) times daily as needed for anxiety. Patient not taking: Reported on 09/01/2014 05/01/13 off  Tonye Pearsonobert P Doolittle, MD  PARoxetine (PAXIL) 40 MG tablet Take 1 tablet (40 mg total) by mouth every morning. Patient not taking: Reported on 09/01/2014 05/01/13 off  Tonye Pearsonobert P Doolittle, MD  traZODone (DESYREL) 50 MG tablet Take 1 tablet (50 mg total) by mouth at bedtime. Patient not taking: Reported on 09/01/2014 05/01/13 off  Tonye Pearsonobert P Doolittle, MD  He has discontinued these medications and is doing well with his psychological symptoms  Still smoking--cutting back  Time for pneumonia vaccine /Tdap  Review of Systems 14 point review of systems by form is negative     Objective:   Physical Exam  Constitutional: He  is oriented to person, place, and time. He appears well-developed and well-nourished.  HENT:  Head: Normocephalic.  Right Ear: External ear normal.  Left Ear: External ear normal.  Nose: Nose normal.  Mouth/Throat: Oropharynx is clear and moist.  Tms and canals clear  Eyes: Conjunctivae and EOM are normal. Pupils are equal, round, and reactive to light.  Neck: Normal range of motion. Neck supple. No thyromegaly present.  Cardiovascular: Normal rate, regular rhythm, normal heart sounds and intact distal pulses.   No murmur heard. Pulmonary/Chest: Effort normal and breath sounds normal. No respiratory distress. He has no wheezes. He has no rales.  Abdominal: Soft. Bowel sounds are normal. He exhibits no distension and no mass. There is no tenderness. There is no rebound and no guarding.  No hepatosplenomegaly  Musculoskeletal: Normal range of motion. He exhibits no edema or tenderness.  Lymphadenopathy:    He has no cervical adenopathy.  Neurological: He is alert and oriented to person, place, and time. He has normal reflexes. No cranial nerve deficit. He exhibits normal muscle tone. Coordination normal.  Skin: Skin is warm and dry. No rash noted.  Psychiatric: He has a normal mood and affect. His behavior is normal. Judgment and thought content normal.  BP 122/80 mmHg  Pulse 90  Temp(Src) 98.7 F (37.1 C)  Resp 18  Ht 6' (1.829 m)  Wt 283 lb (128.368 kg)  BMI 38.37 kg/m2  SpO2 98%       Assessment & Plan:  GAD (generalized anxiety disorder)  Cigarette nicotine  dependence without complication  Multinodular goiter - Plan: TSH, T4, free  Hypersomnia, persistent  Annual physical exam - Plan: CBC with Differential, Comprehensive metabolic panel  Screening for hyperlipidemia - Plan: Lipid panel  Need for Tdap vaccination - Plan: Tdap vaccine greater than or equal to 7yo IM  Need for prophylactic vaccination against Streptococcus pneumoniae (pneumococcus) - Plan: Pneumococcal  polysaccharide vaccine 23-valent greater than or equal to 2yo subcutaneous/IM  notify labs Let me kno if interested in further evaluation for sleep apnea

## 2014-09-03 ENCOUNTER — Encounter: Payer: Self-pay | Admitting: Internal Medicine

## 2015-07-25 ENCOUNTER — Encounter: Payer: Self-pay | Admitting: Internal Medicine

## 2018-11-14 ENCOUNTER — Encounter: Payer: Self-pay | Admitting: Family Medicine

## 2018-11-14 ENCOUNTER — Other Ambulatory Visit: Payer: Self-pay

## 2018-11-14 ENCOUNTER — Ambulatory Visit (INDEPENDENT_AMBULATORY_CARE_PROVIDER_SITE_OTHER): Payer: BLUE CROSS/BLUE SHIELD | Admitting: Family Medicine

## 2018-11-14 ENCOUNTER — Ambulatory Visit (INDEPENDENT_AMBULATORY_CARE_PROVIDER_SITE_OTHER)
Admission: RE | Admit: 2018-11-14 | Discharge: 2018-11-14 | Disposition: A | Discharge status: Home / Self Care | Payer: BLUE CROSS/BLUE SHIELD | Source: Ambulatory Visit | Attending: Family Medicine | Admitting: Family Medicine

## 2018-11-14 VITALS — BP 112/60 | HR 78 | Temp 98.4°F | Ht 72.0 in | Wt 272.0 lb

## 2018-11-14 DIAGNOSIS — L608 Other nail disorders: Secondary | ICD-10-CM | POA: Diagnosis not present

## 2018-11-14 DIAGNOSIS — F339 Major depressive disorder, recurrent, unspecified: Secondary | ICD-10-CM | POA: Diagnosis not present

## 2018-11-14 DIAGNOSIS — M79644 Pain in right finger(s): Secondary | ICD-10-CM | POA: Diagnosis not present

## 2018-11-14 DIAGNOSIS — Z8639 Personal history of other endocrine, nutritional and metabolic disease: Secondary | ICD-10-CM | POA: Diagnosis not present

## 2018-11-14 DIAGNOSIS — R600 Localized edema: Secondary | ICD-10-CM | POA: Diagnosis not present

## 2018-11-14 DIAGNOSIS — Z7689 Persons encountering health services in other specified circumstances: Secondary | ICD-10-CM

## 2018-11-14 LAB — BASIC METABOLIC PANEL
BUN: 15 mg/dL (ref 6–23)
CALCIUM: 8.6 mg/dL (ref 8.4–10.5)
CO2: 26 mEq/L (ref 19–32)
Chloride: 107 mEq/L (ref 96–112)
Creatinine, Ser: 1.28 mg/dL (ref 0.40–1.50)
GFR: 60.31 mL/min (ref >60.00)
GLUCOSE: 93 mg/dL (ref 70–99)
POTASSIUM: 4.6 meq/L (ref 3.5–5.1)
SODIUM: 141 meq/L (ref 135–145)

## 2018-11-14 LAB — CBC WITH DIFFERENTIAL/PLATELET
BASOS PCT: 0.7 % (ref 0.0–3.0)
Basophils Absolute: 0.1 10*3/uL (ref 0.0–0.1)
EOS ABS: 0.2 10*3/uL (ref 0.0–0.7)
EOS PCT: 2.4 % (ref 0.0–5.0)
HCT: 42.9 % (ref 39.0–52.0)
Hemoglobin: 14.5 g/dL (ref 13.0–17.0)
LYMPHS ABS: 1.8 10*3/uL (ref 0.7–4.0)
Lymphocytes Relative: 22.9 % (ref 12.0–46.0)
MCHC: 33.7 g/dL (ref 30.0–36.0)
MCV: 89.9 fl (ref 78.0–100.0)
MONO ABS: 0.8 10*3/uL (ref 0.1–1.0)
Monocytes Relative: 9.8 % (ref 3.0–12.0)
NEUTROS ABS: 4.9 10*3/uL (ref 1.4–7.7)
Neutrophils Relative %: 64.2 % (ref 43.0–77.0)
PLATELETS: 245 10*3/uL (ref 150.0–400.0)
RBC: 4.77 Mil/uL (ref 4.22–5.81)
RDW: 14.6 % (ref 11.5–15.5)
WBC: 7.7 10*3/uL (ref 4.0–10.5)

## 2018-11-14 LAB — TSH: TSH: 2.14 u[IU]/mL (ref 0.35–4.50)

## 2018-11-14 LAB — T4, FREE: Free T4: 0.89 ng/dL (ref 0.60–1.60)
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
## 2018-11-18 ENCOUNTER — Encounter: Payer: Self-pay | Admitting: Family Medicine

## 2018-11-19 DIAGNOSIS — I998 Other disorder of circulatory system: Secondary | ICD-10-CM | POA: Diagnosis not present

## 2018-11-21 LAB — CULTURE BLOOD MANUAL
Micro Number: 344256
Result: NO GROWTH
Specimen Quality: ADEQUATE

## 2018-11-27 ENCOUNTER — Other Ambulatory Visit: Payer: Self-pay

## 2018-11-27 DIAGNOSIS — L98499 Non-pressure chronic ulcer of skin of other sites with unspecified severity: Secondary | ICD-10-CM

## 2018-11-28 ENCOUNTER — Encounter: Payer: Self-pay | Admitting: Vascular Surgery

## 2018-11-28 ENCOUNTER — Other Ambulatory Visit: Payer: Self-pay

## 2018-11-28 ENCOUNTER — Ambulatory Visit (INDEPENDENT_AMBULATORY_CARE_PROVIDER_SITE_OTHER): Payer: BLUE CROSS/BLUE SHIELD | Admitting: Vascular Surgery

## 2018-11-28 ENCOUNTER — Ambulatory Visit (HOSPITAL_COMMUNITY)
Admission: RE | Admit: 2018-11-28 | Discharge: 2018-11-28 | Disposition: A | Discharge status: Home / Self Care | Payer: BLUE CROSS/BLUE SHIELD | Source: Ambulatory Visit | Attending: Family | Admitting: Family

## 2018-11-28 VITALS — BP 117/67 | HR 77 | Temp 97.1°F | Resp 20 | Ht 72.0 in | Wt 269.0 lb

## 2018-11-28 DIAGNOSIS — L98499 Non-pressure chronic ulcer of skin of other sites with unspecified severity: Secondary | ICD-10-CM

## 2018-11-28 NOTE — Progress Notes (Signed)
Office   History of Present Illness: This is a 48 y.o. male presents for evaluation of right index finger pain with ulceration.  States that he was working outside on February 14 when he first noticed this but did not note any injury.  He is not diabetic has never had other issues with digits.  Did not that he had some coolness to the finger prior to ulceration developing.  Was recently started on aspirin.  He is a 1 pack/day smoker.  Does not have any history of heart disease denies a family history of heart or vascular disease.  Only previous surgery was knee surgery.  He does not have any known allergies.  Past Medical History:  Diagnosis Date  . Anxiety   . Hypersomnia, persistent   . Insomnia with sleep apnea   . Substance abuse ([name])   . Tobacco use disorder     Past Surgical History:  Procedure Laterality Date  . KNEE SURGERY Left     No Known Allergies  Prior to Admission medications   Medication Sig Start Date End Date Taking? Authorizing Provider  aspirin EC 81 MG tablet Take 81 mg by mouth daily.   Yes [provider]    Social History   Socioeconomic History  . Marital status: Married    Spouse name: [name]  . Number of children: 2  . Years of education: HS  . Highest education level: Not on file  Occupational History  . Not on file  Social Needs  . Financial resource strain: Not on file  . Food insecurity:    Worry: Not on file    Inability: Not on file  . Transportation needs:    Medical: Not on file    Non-medical: Not on file  Tobacco Use  . Smoking status: Current Every Day Smoker    Packs/day: 2.00    Years: 20.00    Pack years: 40.00    Types: Cigarettes  . Smokeless tobacco: Former Engineer, water and Sexual Activity  . Alcohol use: No  . Drug use: No  . Sexual activity: Yes  Lifestyle  . Physical activity:    Days per week: Not on file    Minutes per session: Not on file  . Stress: Not on file  Relationships  . Social  connections:    Talks on phone: Not on file    Gets together: Not on file    Attends religious service: Not on file    Active member of club or organization: Not on file    Attends meetings of clubs or organizations: Not on file    Relationship status: Not on file  . Intimate partner violence:    Fear of current or ex partner: Not on file    Emotionally abused: Not on file    Physically abused: Not on file    Forced sexual activity: Not on file  Other Topics Concern  . Not on file  Social History Narrative   Patient lives at home with spouse.   Caffeine Use: 2 L daily     Family History  Problem Relation Age of Onset  . Diabetes Mother   . Arthritis Mother   . Cancer Father        lymphoma  . Hyperlipidemia Father   . Alcohol abuse Father   . Arthritis Father   . Depression Father   . Diabetes Father   . Stroke Father     ROS: Cardiovascular: []  chest pain/pressure []   palpitations []  SOB lying flat []  DOE []  pain in legs while walking []  pain in legs at rest []  pain in legs at night []  non-healing ulcers []  hx of DVT []  swelling in legs  Pulmonary: []  productive cough []  asthma/wheezing []  home O2  Neurologic: []  weakness in []  arms []  legs []  numbness in []  arms []  legs []  hx of CVA []  mini stroke [] difficulty speaking or slurred speech []  temporary loss of vision in one eye []  dizziness  Hematologic: []  hx of cancer []  bleeding problems []  problems with blood clotting easily  Endocrine:   []  diabetes []  thyroid disease  GI []  vomiting blood []  blood in stool  GU: []  CKD/renal failure []  HD--[]  M/W/F or []  T/T/S []  burning with urination []  blood in urine  Psychiatric: []  anxiety []  depression  Musculoskeletal: X right index finger ulceration []  joint pain  Integumentary: []  rashes []  ulcers  Constitutional: []  fever []  chills   Physical Examination  Vitals:   11/28/18 1003  BP: 117/67  Pulse: 77  Resp: 20  Temp: (!)  97.1 F (36.2 C)  SpO2: 95%   Body mass index is 36.48 kg/m.  General:  WDWN in NAD HENT: WNL, normocephalic Pulmonary: normal non-labored breathingg Cardiac: Palpable bilateral radial ulnar pulses as well as posterior tibial pulses Extremities: Right index finger distal ulceration appears to have some component of ischemia Extreme tenderness to palpation No evidence of erythema or purulence Musculoskeletal: no muscle wasting or atrophy  Neurologic: A&O X 3; Appropriate Affect ; SENSATION: normal; MOTOR FUNCTION:  moving all extremities equally. Speech is fluent/normal   CBC    Component Value Date/Time   WBC 7.7 11/14/2018 0955   RBC 4.77 11/14/2018 0955   HGB 14.5 11/14/2018 0955   HCT 42.9 11/14/2018 0955   PLT 245.0 11/14/2018 0955   MCV 89.9 11/14/2018 0955   MCV 94.2 05/01/2013 1051   MCH 30.7 09/01/2014 1152   MCHC 33.7 11/14/2018 0955   RDW 14.6 11/14/2018 0955   LYMPHSABS 1.8 11/14/2018 0955   MONOABS 0.8 11/14/2018 0955   EOSABS 0.2 11/14/2018 0955   BASOSABS 0.1 11/14/2018 0955    BMET    Component Value Date/Time   NA 141 11/14/2018 0955   K 4.6 11/14/2018 0955   CL 107 11/14/2018 0955   CO2 26 11/14/2018 0955   GLUCOSE 93 11/14/2018 0955   BUN 15 11/14/2018 0955   CREATININE 1.28 11/14/2018 0955   CREATININE 0.86 09/01/2014 1152   CALCIUM 8.6 11/14/2018 0955    COAGS: No results found for: INR, PROTIME   Non-Invasive Vascular Imaging:   I have independently interpreted his right upper extremity duplex which demonstrate triphasic waveforms throughout.   ASSESSMENT/PLAN: This is a 48 y.o. male with ulceration of his right index finger may be from minimal trauma but has failed to heal in the past 6 weeks.  He is not diabetic.  Certainly some component of this is ischemic but he does have palpable radial ulnar pulses bilaterally.  Suspect possible embolizing source will start with CT Angio of the chest and right upper extremity as right upper  extremity duplex did not demonstrate any stenosis.  I discussed with Dr. Merlyn Lot but he can proceed with any intervention necessary prior to US obtaining CT scan.  Agree with aspirin therapy at least.  I discussed smoking cessation as smoking is likely in someway causative for this problem.  He demonstrates good understanding.  Esme Freund C. Randie Heinz, MD [facility] Office: [phone] Pager: [phone]

## 2018-12-04 ENCOUNTER — Ambulatory Visit
Admission: RE | Admit: 2018-12-04 | Discharge: 2018-12-04 | Disposition: A | Discharge status: Home / Self Care | Payer: BLUE CROSS/BLUE SHIELD | Source: Ambulatory Visit | Attending: Vascular Surgery | Admitting: Vascular Surgery

## 2018-12-04 ENCOUNTER — Other Ambulatory Visit: Payer: Self-pay

## 2018-12-04 DIAGNOSIS — L98499 Non-pressure chronic ulcer of skin of other sites with unspecified severity: Secondary | ICD-10-CM

## 2018-12-04 DIAGNOSIS — R911 Solitary pulmonary nodule: Secondary | ICD-10-CM | POA: Diagnosis not present

## 2018-12-04 MED ORDER — IOPAMIDOL (ISOVUE-370) INJECTION 76%
75.0000 mL | Freq: Once | INTRAVENOUS | Status: AC | PRN
  Administered 2018-12-04: 75 mL via INTRAVENOUS

## 2018-12-08 ENCOUNTER — Other Ambulatory Visit: Payer: Self-pay | Admitting: *Deleted

## 2018-12-09 ENCOUNTER — Other Ambulatory Visit: Payer: Self-pay

## 2018-12-09 ENCOUNTER — Ambulatory Visit (HOSPITAL_COMMUNITY): Payer: BLUE CROSS/BLUE SHIELD | Attending: Cardiology

## 2018-12-09 DIAGNOSIS — L608 Other nail disorders: Secondary | ICD-10-CM

## 2018-12-11 ENCOUNTER — Telehealth (HOSPITAL_COMMUNITY): Payer: Self-pay | Admitting: Rehabilitation

## 2018-12-11 NOTE — Telephone Encounter (Signed)
The above patient or their representative was contacted and gave the following answers to these questions:         Do you have any of the following symptoms? No  Fever                    Cough                   Shortness of breath  Do  you have any of the following other symptoms? No   muscle pain         vomiting,        diarrhea        rash         weakness        red eye        abdominal pain         bruising          bruising or bleeding              joint pain           severe headache    Have you been in contact with someone who was or has been sick in the past 2 weeks? No  Yes                 Unsure                         Unable to assess   Does the person that you were in contact with have any of the following symptoms?   Cough         shortness of breath           muscle pain         vomiting,            diarrhea            rash            weakness           fever            red eye           abdominal pain           bruising  or  bleeding                joint pain                severe headache               Have you  or someone you have been in contact with traveled internationally in th last month? No        If yes, which countries?   Have you  or someone you have been in contact with traveled outside [place] in th last month? No         If yes, which state and city?   COMMENTS OR ACTION PLAN FOR THIS PATIENT:          

## 2018-12-12 ENCOUNTER — Other Ambulatory Visit: Payer: Self-pay

## 2018-12-12 ENCOUNTER — Ambulatory Visit (INDEPENDENT_AMBULATORY_CARE_PROVIDER_SITE_OTHER): Payer: BLUE CROSS/BLUE SHIELD | Admitting: Vascular Surgery

## 2018-12-12 ENCOUNTER — Encounter: Payer: Self-pay | Admitting: Vascular Surgery

## 2018-12-12 VITALS — BP 126/77 | HR 69 | Temp 97.3°F | Resp 18 | Ht 73.0 in | Wt 268.0 lb

## 2018-12-12 DIAGNOSIS — L98499 Non-pressure chronic ulcer of skin of other sites with unspecified severity: Secondary | ICD-10-CM

## 2018-12-12 NOTE — Progress Notes (Signed)
History of Present Illness: This is a 47 y.o. male presents for evaluation of right index finger pain with ulceration.  States that he was working outside on February 14 when he first noticed this but did not note any injury.  He is not diabetic has never had other issues with digits.  Did not that he had some coolness to the finger prior to ulceration developing.  Was recently started on aspirin.  He is a 1 pack/day smoker.  Does not have any history of heart disease denies a family history of heart or vascular disease.  Only previous surgery was knee surgery.  He does not have any known allergies.  Patient returns for follow-up from previous visit.  He has had CT scan as well as echocardiogram.  He is now taking aspirin.  He continues to smoke.  States that his finger is feeling some better at this time.      Past Medical History:  Diagnosis Date  . Anxiety   . Hypersomnia, persistent   . Insomnia with sleep apnea   . Substance abuse ([name])   . Tobacco use disorder     Past Surgical History:  Procedure Laterality Date  . KNEE SURGERY Left     No Known Allergies         Prior to Admission medications   Medication Sig Start Date End Date Taking? Authorizing Provider  aspirin EC 81 MG tablet Take 81 mg by mouth daily.   Yes [provider]    Social History        Socioeconomic History  . Marital status: Married    Spouse name: [name]  . Number of children: 2  . Years of education: HS  . Highest education level: Not on file  Occupational History  . Not on file  Social Needs  . Financial resource strain: Not on file  . Food insecurity:    Worry: Not on file    Inability: Not on file  . Transportation needs:    Medical: Not on file    Non-medical: Not on file  Tobacco Use  . Smoking status: Current Every Day Smoker    Packs/day: 2.00    Years: 20.00    Pack years: 40.00    Types: Cigarettes  . Smokeless tobacco: Former  Engineer, water and Sexual Activity  . Alcohol use: No  . Drug use: No  . Sexual activity: Yes  Lifestyle  . Physical activity:    Days per week: Not on file    Minutes per session: Not on file  . Stress: Not on file  Relationships  . Social connections:    Talks on phone: Not on file    Gets together: Not on file    Attends religious service: Not on file    Active member of club or organization: Not on file    Attends meetings of clubs or organizations: Not on file    Relationship status: Not on file  . Intimate partner violence:    Fear of current or ex partner: Not on file    Emotionally abused: Not on file    Physically abused: Not on file    Forced sexual activity: Not on file  Other Topics Concern  . Not on file  Social History Narrative   Patient lives at home with spouse.   Caffeine Use: 2 L daily          Family History  Problem Relation Age of Onset  .  Diabetes Mother   . Arthritis Mother   . Cancer Father        lymphoma  . Hyperlipidemia Father   . Alcohol abuse Father   . Arthritis Father   . Depression Father   . Diabetes Father   . Stroke Father     ROS: Cardiovascular: []  chest pain/pressure []  palpitations []  SOB lying flat []  DOE []  pain in legs while walking []  pain in legs at rest []  pain in legs at night []  non-healing ulcers []  hx of DVT []  swelling in legs  Pulmonary: []  productive cough []  asthma/wheezing []  home O2  Neurologic: []  weakness in []  arms []  legs []  numbness in []  arms []  legs []  hx of CVA []  mini stroke [] difficulty speaking or slurred speech []  temporary loss of vision in one eye []  dizziness  Hematologic: []  hx of cancer []  bleeding problems []  problems with blood clotting easily  Endocrine:   []  diabetes []  thyroid disease  GI []  vomiting blood []  blood in stool  GU: []  CKD/renal failure []  HD--[]  M/W/F or []  T/T/S []  burning with urination []   blood in urine  Psychiatric: []  anxiety []  depression  Musculoskeletal: X right index finger ulceration []  joint pain  Integumentary: []  rashes []  ulcers  Constitutional: []  fever []  chills   Physical Examination  Vitals:   12/12/18 0902  BP: 126/77  Pulse: 69  Resp: 18  Temp: (!) 97.3 F (36.3 C)  SpO2: 99%     General:   No acute distress Pulmonary: normal non-labored breathingg Cardiac:  Palpable radial and ulnar pulses bilaterally Musculoskeletal:         CTA IMPRESSION: Aorta is normal caliber. No calcified or noncalcified plaque. Great vessels and vessels with in the right upper extremity widely patent. Only arterial disease visualized is minimal punctate calcification in the proximal right common iliac artery.  I reviewed the CT scan with the patient in the office today I do not see any place for intervention as noted above.   ASSESSMENT/PLAN: This is a 48 y.o. male with ulceration of his right index finger may be from minimal trauma now here for follow-up.  Echocardiogram normal.  I reviewed his CT scan with him today I do not see any place for intervention.  I discussed with him the urgent need to stop smoking.  He should remain on aspirin lifelong as long as there is no contraindication.  There is some concern that there may be bony involvement of the distal tip of his finger.  We will get him back with hand surgery they should be okay to proceed with any necessary procedures with good chance of healing from a vascular standpoint.  Brandon C. Randie Heinzain, MD [facility] Office: [phone] Pager: [phone]

## 2018-12-17 ENCOUNTER — Encounter (HOSPITAL_BASED_OUTPATIENT_CLINIC_OR_DEPARTMENT_OTHER): Payer: Self-pay | Admitting: *Deleted

## 2018-12-17 ENCOUNTER — Other Ambulatory Visit: Payer: Self-pay

## 2018-12-17 ENCOUNTER — Other Ambulatory Visit: Payer: Self-pay | Admitting: Orthopedic Surgery

## 2018-12-17 DIAGNOSIS — M86241 Subacute osteomyelitis, right hand: Secondary | ICD-10-CM | POA: Diagnosis not present

## 2018-12-17 DIAGNOSIS — I998 Other disorder of circulatory system: Secondary | ICD-10-CM | POA: Diagnosis not present

## 2018-12-17 DIAGNOSIS — S41101D Unspecified open wound of right upper arm, subsequent encounter: Secondary | ICD-10-CM | POA: Diagnosis not present

## 2018-12-18 ENCOUNTER — Ambulatory Visit (HOSPITAL_BASED_OUTPATIENT_CLINIC_OR_DEPARTMENT_OTHER): Payer: BLUE CROSS/BLUE SHIELD | Admitting: Anesthesiology

## 2018-12-18 ENCOUNTER — Encounter (HOSPITAL_BASED_OUTPATIENT_CLINIC_OR_DEPARTMENT_OTHER): Payer: Self-pay | Admitting: *Deleted

## 2018-12-18 ENCOUNTER — Other Ambulatory Visit: Payer: Self-pay

## 2018-12-18 ENCOUNTER — Encounter (HOSPITAL_BASED_OUTPATIENT_CLINIC_OR_DEPARTMENT_OTHER)
Admission: RE | Disposition: A | Discharge status: Home / Self Care | Payer: Self-pay | Source: Home / Self Care | Attending: Orthopedic Surgery

## 2018-12-18 ENCOUNTER — Ambulatory Visit (HOSPITAL_BASED_OUTPATIENT_CLINIC_OR_DEPARTMENT_OTHER)
Admission: RE | Admit: 2018-12-18 | Discharge: 2018-12-18 | Disposition: A | Discharge status: Home / Self Care | Payer: BLUE CROSS/BLUE SHIELD | Attending: Orthopedic Surgery | Admitting: Orthopedic Surgery

## 2018-12-18 DIAGNOSIS — M898X4 Other specified disorders of bone, hand: Secondary | ICD-10-CM | POA: Diagnosis not present

## 2018-12-18 DIAGNOSIS — X58XXXA Exposure to other specified factors, initial encounter: Secondary | ICD-10-CM | POA: Insufficient documentation

## 2018-12-18 DIAGNOSIS — Z7982 Long term (current) use of aspirin: Secondary | ICD-10-CM | POA: Insufficient documentation

## 2018-12-18 DIAGNOSIS — G4730 Sleep apnea, unspecified: Secondary | ICD-10-CM | POA: Insufficient documentation

## 2018-12-18 DIAGNOSIS — F17210 Nicotine dependence, cigarettes, uncomplicated: Secondary | ICD-10-CM | POA: Insufficient documentation

## 2018-12-18 DIAGNOSIS — S61200A Unspecified open wound of right index finger without damage to nail, initial encounter: Secondary | ICD-10-CM | POA: Diagnosis not present

## 2018-12-18 DIAGNOSIS — M868X4 Other osteomyelitis, hand: Secondary | ICD-10-CM | POA: Diagnosis not present

## 2018-12-18 DIAGNOSIS — M869 Osteomyelitis, unspecified: Secondary | ICD-10-CM | POA: Diagnosis not present

## 2018-12-18 DIAGNOSIS — G4700 Insomnia, unspecified: Secondary | ICD-10-CM | POA: Diagnosis not present

## 2018-12-18 HISTORY — PX: AMPUTATION: SHX166

## 2018-12-18 HISTORY — DX: Sleep apnea, unspecified: G47.30

## 2018-12-18 SURGERY — AMPUTATION DIGIT
Anesthesia: General | Site: Finger | Laterality: Right

## 2018-12-18 MED ORDER — MIDAZOLAM HCL 2 MG/2ML IJ SOLN
INTRAMUSCULAR | Status: AC
  Filled 2018-12-18: qty 2

## 2018-12-18 MED ORDER — ONDANSETRON HCL 4 MG/2ML IJ SOLN
INTRAMUSCULAR | Status: AC
  Filled 2018-12-18: qty 2

## 2018-12-18 MED ORDER — OXYCODONE HCL 5 MG PO TABS: 5.0000 mg | ORAL_TABLET | Freq: Once | ORAL | Status: DC | PRN

## 2018-12-18 MED ORDER — MEPERIDINE HCL 25 MG/ML IJ SOLN: 6.2500 mg | INTRAMUSCULAR | Status: DC | PRN

## 2018-12-18 MED ORDER — BUPIVACAINE HCL (PF) 0.25 % IJ SOLN
INTRAMUSCULAR | Status: DC | PRN
  Administered 2018-12-18: 10 mL

## 2018-12-18 MED ORDER — LACTATED RINGERS IV SOLN
INTRAVENOUS | Status: DC
  Administered 2018-12-18: 13:00:00 via INTRAVENOUS

## 2018-12-18 MED ORDER — FENTANYL CITRATE (PF) 100 MCG/2ML IJ SOLN
INTRAMUSCULAR | Status: AC
  Filled 2018-12-18: qty 2

## 2018-12-18 MED ORDER — CEFAZOLIN SODIUM-DEXTROSE 2-4 GM/100ML-% IV SOLN
2.0000 g | INTRAVENOUS | Status: AC
  Administered 2018-12-18: 16:00:00 2 g via INTRAVENOUS

## 2018-12-18 MED ORDER — SCOPOLAMINE 1 MG/3DAYS TD PT72: 1.0000 | MEDICATED_PATCH | Freq: Once | TRANSDERMAL | Status: DC | PRN

## 2018-12-18 MED ORDER — FENTANYL CITRATE (PF) 100 MCG/2ML IJ SOLN: 50.0000 ug | INTRAMUSCULAR | Status: DC | PRN

## 2018-12-18 MED ORDER — MIDAZOLAM HCL 2 MG/2ML IJ SOLN
INTRAMUSCULAR | Status: DC | PRN
  Administered 2018-12-18: 2 mg via INTRAVENOUS

## 2018-12-18 MED ORDER — CEFAZOLIN SODIUM-DEXTROSE 2-4 GM/100ML-% IV SOLN
INTRAVENOUS | Status: AC
  Filled 2018-12-18: qty 100

## 2018-12-18 MED ORDER — HYDROMORPHONE HCL 1 MG/ML IJ SOLN: 0.2500 mg | INTRAMUSCULAR | Status: DC | PRN

## 2018-12-18 MED ORDER — PROMETHAZINE HCL 25 MG/ML IJ SOLN: 6.2500 mg | INTRAMUSCULAR | Status: DC | PRN

## 2018-12-18 MED ORDER — FENTANYL CITRATE (PF) 100 MCG/2ML IJ SOLN
INTRAMUSCULAR | Status: DC | PRN
  Administered 2018-12-18 (×2): 50 ug via INTRAVENOUS

## 2018-12-18 MED ORDER — OXYCODONE HCL 5 MG/5ML PO SOLN: 5.0000 mg | Freq: Once | ORAL | Status: DC | PRN

## 2018-12-18 MED ORDER — CHLORHEXIDINE GLUCONATE 4 % EX LIQD: 60.0000 mL | Freq: Once | CUTANEOUS | Status: DC

## 2018-12-18 MED ORDER — DEXAMETHASONE SODIUM PHOSPHATE 10 MG/ML IJ SOLN
INTRAMUSCULAR | Status: DC | PRN
  Administered 2018-12-18: 10 mg via INTRAVENOUS

## 2018-12-18 MED ORDER — PROPOFOL 10 MG/ML IV BOLUS
INTRAVENOUS | Status: DC | PRN
  Administered 2018-12-18: 200 mg via INTRAVENOUS

## 2018-12-18 MED ORDER — DEXAMETHASONE SODIUM PHOSPHATE 10 MG/ML IJ SOLN
INTRAMUSCULAR | Status: AC
  Filled 2018-12-18: qty 1

## 2018-12-18 MED ORDER — LIDOCAINE 2% (20 MG/ML) 5 ML SYRINGE
INTRAMUSCULAR | Status: DC | PRN
  Administered 2018-12-18: 60 mg via INTRAVENOUS

## 2018-12-18 MED ORDER — HYDROCODONE-ACETAMINOPHEN 5-325 MG PO TABS: ORAL_TABLET | ORAL | 0 refills | Status: DC

## 2018-12-18 MED ORDER — MIDAZOLAM HCL 2 MG/2ML IJ SOLN: 1.0000 mg | INTRAMUSCULAR | Status: DC | PRN

## 2018-12-18 MED ORDER — LIDOCAINE 2% (20 MG/ML) 5 ML SYRINGE
INTRAMUSCULAR | Status: AC
  Filled 2018-12-18: qty 5

## 2018-12-18 MED ORDER — ONDANSETRON HCL 4 MG/2ML IJ SOLN
INTRAMUSCULAR | Status: DC | PRN
  Administered 2018-12-18: 4 mg via INTRAVENOUS

## 2018-12-18 SURGICAL SUPPLY — 53 items
APL PRP STRL LF DISP 70% ISPRP (MISCELLANEOUS) ×1
BANDAGE ACE 3X5.8 VEL STRL LF (GAUZE/BANDAGES/DRESSINGS) IMPLANT
BANDAGE COBAN LF 1.5X5 NS (GAUZE/BANDAGES/DRESSINGS) ×2 IMPLANT
BLADE MINI RND TIP GREEN BEAV (BLADE) IMPLANT
BLADE SURG 15 STRL LF DISP TIS (BLADE) ×2 IMPLANT
BLADE SURG 15 STRL SS (BLADE) ×6
BNDG CMPR 9X4 STRL LF SNTH (GAUZE/BANDAGES/DRESSINGS) ×1
BNDG ELASTIC 2X5.8 VLCR STR LF (GAUZE/BANDAGES/DRESSINGS) IMPLANT
BNDG ESMARK 4X9 LF (GAUZE/BANDAGES/DRESSINGS) ×2 IMPLANT
BNDG GAUZE 1X2.1 STRL (MISCELLANEOUS) IMPLANT
CHLORAPREP W/TINT 26 (MISCELLANEOUS) ×2 IMPLANT
CORD BIPOLAR FORCEPS 12FT (ELECTRODE) ×3 IMPLANT
COVER BACK TABLE REUSABLE LG (DRAPES) ×3 IMPLANT
COVER MAYO STAND REUSABLE (DRAPES) ×3 IMPLANT
COVER WAND RF STERILE (DRAPES) IMPLANT
DECANTER SPIKE VIAL GLASS SM (MISCELLANEOUS) IMPLANT
DRAIN PENROSE 1/4X12 LTX STRL (WOUND CARE) IMPLANT
DRAPE EXTREMITY T 121X128X90 (DISPOSABLE) ×3 IMPLANT
DRAPE OEC MINIVIEW 54X84 (DRAPES) IMPLANT
DRAPE SURG 17X23 STRL (DRAPES) ×3 IMPLANT
GAUZE SPONGE 4X4 12PLY STRL (GAUZE/BANDAGES/DRESSINGS) ×3 IMPLANT
GAUZE SPONGE 4X4 12PLY STRL LF (GAUZE/BANDAGES/DRESSINGS) IMPLANT
GAUZE XEROFORM 1X8 LF (GAUZE/BANDAGES/DRESSINGS) ×3 IMPLANT
GLOVE BIO SURGEON STRL SZ7.5 (GLOVE) ×3 IMPLANT
GLOVE BIOGEL PI IND STRL 8 (GLOVE) ×1 IMPLANT
GLOVE BIOGEL PI IND STRL 8.5 (GLOVE) IMPLANT
GLOVE BIOGEL PI INDICATOR 8 (GLOVE) ×2
GLOVE BIOGEL PI INDICATOR 8.5 (GLOVE) ×2
GLOVE EXAM NITRILE MD LF STRL (GLOVE) ×2 IMPLANT
GLOVE SURG ORTHO 8.0 STRL STRW (GLOVE) ×2 IMPLANT
GOWN STRL REUS W/ TWL LRG LVL3 (GOWN DISPOSABLE) ×1 IMPLANT
GOWN STRL REUS W/TWL LRG LVL3 (GOWN DISPOSABLE) ×3
GOWN STRL REUS W/TWL XL LVL3 (GOWN DISPOSABLE) ×5 IMPLANT
NDL HYPO 25X1 1.5 SAFETY (NEEDLE) IMPLANT
NEEDLE HYPO 25X1 1.5 SAFETY (NEEDLE) ×3 IMPLANT
NS IRRIG 1000ML POUR BTL (IV SOLUTION) ×3 IMPLANT
PACK BASIN DAY SURGERY FS (CUSTOM PROCEDURE TRAY) ×3 IMPLANT
PADDING CAST ABS 4INX4YD NS (CAST SUPPLIES)
PADDING CAST ABS COTTON 4X4 ST (CAST SUPPLIES) ×1 IMPLANT
SPLINT FINGER 3.25 911903 (SOFTGOODS) ×2 IMPLANT
STOCKINETTE 4X48 STRL (DRAPES) ×3 IMPLANT
SUT CHROMIC 4 0 P 3 18 (SUTURE) IMPLANT
SUT ETHILON 3 0 PS 1 (SUTURE) IMPLANT
SUT ETHILON 4 0 PS 2 18 (SUTURE) IMPLANT
SUT MERSILENE 4 0 P 3 (SUTURE) IMPLANT
SUT MON AB 5-0 P3 18 (SUTURE) ×2 IMPLANT
SUT VIC AB 4-0 P-3 18XBRD (SUTURE) IMPLANT
SUT VIC AB 4-0 P3 18 (SUTURE)
SYR BULB 3OZ (MISCELLANEOUS) ×3 IMPLANT
SYR CONTROL 10ML LL (SYRINGE) ×2 IMPLANT
TOWEL GREEN STERILE FF (TOWEL DISPOSABLE) ×3 IMPLANT
TRAY DSU PREP LF (CUSTOM PROCEDURE TRAY) ×1 IMPLANT
UNDERPAD 30X30 (UNDERPADS AND DIAPERS) ×3 IMPLANT

## 2018-12-18 NOTE — Anesthesia Postprocedure Evaluation (Signed)
Anesthesia Post Note  Patient: Kevin Andrade  Procedure(s) Performed: RIGHT DISTAL PHALANX INDEX FINGER AMPUTATION (Right Finger)     Patient location during evaluation: PACU Anesthesia Type: General Level of consciousness: awake and alert Pain management: pain level controlled Vital Signs Assessment: post-procedure vital signs reviewed and stable Respiratory status: spontaneous breathing, nonlabored ventilation and respiratory function stable Cardiovascular status: blood pressure returned to baseline and stable Postop Assessment: no apparent nausea or vomiting Anesthetic complications: no    Last Vitals:  Vitals:   12/18/18 1700 12/18/18 1709  BP: (!) 151/87   Pulse: 81 84  Resp: 15 15  Temp:    SpO2: 99% 98%    Last Pain:  Vitals:   12/18/18 1700  TempSrc:   PainSc: 2                  Lowella Curb

## 2018-12-18 NOTE — Op Note (Signed)
I assisted Surgeon(s) and Role:    * Betha Loa, MD - Primary    Cindee Salt, MD - Assisting on the Procedure(s): RIGHT DISTAL PHALANX INDEX FINGER AMPUTATION on 12/18/2018.  I provided assistance on this case as follows: setup, approach, retraction cultures, amputation and closure followed by application of the dressings and splint, Electronically signed by: Cindee Salt, MD Date: 12/18/2018 Time: 4:25 PM

## 2018-12-18 NOTE — Discharge Instructions (Addendum)

## 2018-12-18 NOTE — Anesthesia Procedure Notes (Signed)
Procedure Name: LMA Insertion Date/Time: 12/18/2018 3:47 PM Performed by: Francie Massing, CRNA Pre-anesthesia Checklist: Patient identified, Emergency Drugs available, Suction available and Patient being monitored Patient Re-evaluated:Patient Re-evaluated prior to induction Oxygen Delivery Method: Circle system utilized Preoxygenation: Pre-oxygenation with 100% oxygen Induction Type: IV induction Ventilation: Mask ventilation without difficulty LMA: LMA inserted LMA Size: 5.0 Number of attempts: 1 Airway Equipment and Method: Bite block Placement Confirmation: positive ETCO2 Tube secured with: Tape Dental Injury: Teeth and Oropharynx as per pre-operative assessment

## 2018-12-18 NOTE — Transfer of Care (Signed)
Immediate Anesthesia Transfer of Care Note  Patient: Kevin Andrade  Procedure(s) Performed: Procedure(s) (LRB): RIGHT DISTAL PHALANX INDEX FINGER AMPUTATION (Right)  Patient Location: PACU  Anesthesia Type: General  Level of Consciousness: awake, oriented, sedated and patient cooperative  Airway & Oxygen Therapy: Patient Spontanous Breathing and Patient connected to face mask oxygen  Post-op Assessment: Report given to PACU RN and Post -op Vital signs reviewed and stable  Post vital signs: Reviewed and stable  Complications: No apparent anesthesia complications Last Vitals:  Vitals Value Taken Time  BP 143/96 12/18/2018  4:33 PM  Temp    Pulse 86 12/18/2018  4:35 PM  Resp 17 12/18/2018  4:35 PM  SpO2 99 % 12/18/2018  4:35 PM  Vitals shown include unvalidated device data.  Last Pain:  Vitals:   12/18/18 1634  TempSrc:   PainSc: (P) 0-No pain

## 2018-12-18 NOTE — Op Note (Signed)
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   ASSISTANT: Cindee Salt, MD   ANESTHESIA:  General   INTRAVENOUS FLUIDS:  Per anesthesia flow sheet.   ESTIMATED BLOOD LOSS:  Minimal.   COMPLICATIONS:  None.   SPECIMENS:   Tissue cultures to micro and distal phalanx to pathology for gross examination   TOURNIQUET TIME:    Total Tourniquet Time Documented: Upper Arm (Right) - 23 minutes Total: Upper Arm (Right) - 23 minutes    DISPOSITION:  Stable to PACU.   INDICATIONS: 48 year old male with ischemic changes to the right index finger over the past month with development of a nonhealing wound and osteomyelitis of the distal phalanx.  He wishes to proceed with amputation of the distal aspect of the index finger for management of the nonhealing wound and infection. Risks, benefits and alternatives of surgery were discussed including the risks of blood loss, infection, damage to nerves, vessels, tendons, ligaments, bone for surgery, need for additional surgery, complications with wound healing, continued pain, nonunion, malunion, stiffness.  He voiced understanding of these risks and elected to proceed.  OPERATIVE COURSE:  After being identified preoperatively by myself,  the patient and I agreed on the procedure and site of the procedure.  The surgical site was marked.  Surgical consent had been signed. He was given IV antibiotics as preoperative antibiotic prophylaxis. He was transferred to the operating room and placed on the  operating table in supine position with the Right upper extremity on an arm board.  General anesthesia was induced by the anesthesiologist.  Right upper extremity was prepped and draped in normal sterile orthopedic fashion.  A surgical pause was performed between the surgeons, anesthesia, and operating room staff and all were in agreement as to the patient, procedure, and site of procedure.  Tourniquet at the proximal aspect of the extremity was inflated to 250 mmHg after exsanguination of the arm with an Esmarch bandage.    The knife was used to excise the edges of the wound.  No gross purulence was encountered.  The bone was friable and soft.  Rongeurs were used to take a sample to send to microbiology for tissue cultures for aerobes anaerobes AFB and fungus.  The knife was then used to excise the nailbed and germinal matrix.  The distal phalanx bone was then sharply removed.  It was sent to pathology for gross examination.  The condyles of the middle phalanx were taken down with the rongeurs.  The wound was then copiously irrigated with sterile saline.  The skin flaps were then shaped to fit to provide good tension-free coverage over the end of the finger.  5-0 Monocryl suture was used in an interrupted fashion to reapproximate the skin edges.  A vessel loop drain was placed in the wound.  The wound was dressed with sterile Xeroform 4 x 4's and wrapped with a Coban dressing lightly.  An AlumaFoam splint was placed and wrapped lightly with Coban dressing.  A digital block was performed with quarter percent plain Marcaine to aid in  postoperative analgesia.  The tourniquet was deflated at 23 minutes.  Fingertips were pink with brisk capillary refill after deflation of tourniquet.  The operative  drapes were broken down.  The patient was awoken from anesthesia safely.  He was transferred back to the stretcher and taken to PACU in stable condition.  I will see him back in the office in 1 week for postoperative  followup.  I will give him a prescription for Norco 5/325 1-2 tabs PO q6 hours prn pain, dispense # 20 he already has a prescription for Bactrim DS 1 p.o. twice daily.   Betha LoaKevin Jnae Thomaston, MD Electronically signed, 12/18/18

## 2018-12-18 NOTE — Anesthesia Preprocedure Evaluation (Signed)
Anesthesia Evaluation  Patient identified by MRN, date of birth, ID band Patient awake    Reviewed: Allergy & Precautions, NPO status , Patient's Chart, lab work & pertinent test results  Airway Mallampati: II  TM Distance: >3 FB Neck ROM: Full    Dental no notable dental hx.    Pulmonary sleep apnea , Current Smoker,    Pulmonary exam normal breath sounds clear to auscultation       Cardiovascular negative cardio ROS Normal cardiovascular exam Rhythm:Regular Rate:Normal     Neuro/Psych Anxiety negative neurological ROS  negative psych ROS   GI/Hepatic negative GI ROS, Neg liver ROS,   Endo/Other  negative endocrine ROS  Renal/GU negative Renal ROS  negative genitourinary   Musculoskeletal negative musculoskeletal ROS (+)   Abdominal (+) + obese,   Peds negative pediatric ROS (+)  Hematology negative hematology ROS (+)   Anesthesia Other Findings   Reproductive/Obstetrics negative OB ROS                             Anesthesia Physical Anesthesia Plan  ASA: II  Anesthesia Plan: General   Post-op Pain Management:    Induction: Intravenous  PONV Risk Score and Plan: 1 and Ondansetron  Airway Management Planned: LMA  Additional Equipment:   Intra-op Plan:   Post-operative Plan: Extubation in OR  Informed Consent: I have reviewed the patients History and Physical, chart, labs and discussed the procedure including the risks, benefits and alternatives for the proposed anesthesia with the patient or authorized representative who has indicated his/her understanding and acceptance.     Dental advisory given  Plan Discussed with: CRNA  Anesthesia Plan Comments:         Anesthesia Quick Evaluation

## 2018-12-18 NOTE — H&P (Signed)
  Kevin Andrade is an 48 y.o. male.   Chief Complaint: right index finger wound HPI: 48 yo male with ischemia to right index finger with non healing wound and osteomyelitis of distal phalanx.  He wishes to proceed with amputation of distal aspect of index finger to achieve healing of wound and elimination of infection.  Allergies: No Known Allergies  Past Medical History:  Diagnosis Date  . Anxiety   . Hypersomnia, persistent   . Insomnia with sleep apnea   . Sleep apnea   . Substance abuse ([name])   . Tobacco use disorder     Past Surgical History:  Procedure Laterality Date  . KNEE SURGERY Left     Family History: Family History  Problem Relation Age of Onset  . Diabetes Mother   . Arthritis Mother   . Cancer Father        lymphoma  . Hyperlipidemia Father   . Alcohol abuse Father   . Arthritis Father   . Depression Father   . Diabetes Father   . Stroke Father     Social History:   reports that he has been smoking cigarettes. He has a 40.00 pack-year smoking history. He has quit using smokeless tobacco. He reports that he does not drink alcohol or use drugs.  Medications: Medications Prior to Admission  Medication Sig Dispense Refill  . aspirin EC 81 MG tablet Take 81 mg by mouth daily.    Marland Kitchen ibuprofen (ADVIL) 200 MG tablet Take 200 mg by mouth every 6 (six) hours as needed.      No results found for this or any previous visit (from the past 48 hour(s)).  No results found.   A comprehensive review of systems was negative.  Blood pressure (!) 143/89, pulse 93, temperature 98.5 F (36.9 C), temperature source Oral, height 6\' 1"  (1.854 m), weight 121 kg, SpO2 100 %.  General appearance: alert, cooperative and appears stated age Head: Normocephalic, without obvious abnormality, atraumatic Neck: supple, symmetrical, trachea midline Cardio: regular rate and rhythm Resp: clear to auscultation bilaterally Extremities: Intact sensation and capillary refill  all digits.  +epl/fpl/io.  Wound distal phalanx right index finger.  No erythema. Pulses: 2+ and symmetric Skin: Skin color, texture, turgor normal. No rashes or lesions Neurologic: Grossly normal Incision/Wound: as above  Assessment/Plan Right index finger with non healing wound and osteomyelitis of distal phalanx by xray.  He wishes to proceed with amputation of distal aspect of finger to achieve healing and eliminate infection.  Non operative and operative treatment options have been discussed with the patient and patient wishes to proceed with operative treatment. Risks, benefits, and alternatives of surgery have been discussed and the patient agrees with the plan of care.   Betha Loa 12/18/2018, 1:28 PM

## 2018-12-19 ENCOUNTER — Encounter (HOSPITAL_BASED_OUTPATIENT_CLINIC_OR_DEPARTMENT_OTHER): Payer: Self-pay | Admitting: Orthopedic Surgery

## 2018-12-20 LAB — ACID FAST SMEAR (AFB, MYCOBACTERIA): Acid Fast Smear: NEGATIVE

## 2018-12-24 DIAGNOSIS — M79644 Pain in right finger(s): Secondary | ICD-10-CM | POA: Diagnosis not present

## 2018-12-24 DIAGNOSIS — S41101D Unspecified open wound of right upper arm, subsequent encounter: Secondary | ICD-10-CM | POA: Diagnosis not present

## 2018-12-24 DIAGNOSIS — M25641 Stiffness of right hand, not elsewhere classified: Secondary | ICD-10-CM | POA: Diagnosis not present

## 2018-12-24 DIAGNOSIS — I998 Other disorder of circulatory system: Secondary | ICD-10-CM | POA: Diagnosis not present

## 2018-12-24 LAB — AEROBIC/ANAEROBIC CULTURE W GRAM STAIN (SURGICAL/DEEP WOUND): Culture: NO GROWTH

## 2019-01-14 DIAGNOSIS — M25641 Stiffness of right hand, not elsewhere classified: Secondary | ICD-10-CM | POA: Diagnosis not present

## 2019-01-14 DIAGNOSIS — R29898 Other symptoms and signs involving the musculoskeletal system: Secondary | ICD-10-CM | POA: Diagnosis not present

## 2019-01-14 DIAGNOSIS — S41101D Unspecified open wound of right upper arm, subsequent encounter: Secondary | ICD-10-CM | POA: Diagnosis not present

## 2019-01-19 LAB — FUNGUS CULTURE WITH STAIN

## 2019-01-19 LAB — FUNGAL ORGANISM REFLEX

## 2019-01-19 LAB — FUNGUS CULTURE RESULT

## 2019-02-03 LAB — ACID FAST CULTURE WITH REFLEXED SENSITIVITIES (MYCOBACTERIA): Acid Fast Culture: NEGATIVE

## 2019-03-04 DIAGNOSIS — S41101D Unspecified open wound of right upper arm, subsequent encounter: Secondary | ICD-10-CM | POA: Diagnosis not present

## 2019-03-04 DIAGNOSIS — I998 Other disorder of circulatory system: Secondary | ICD-10-CM | POA: Diagnosis not present

## 2019-04-28 ENCOUNTER — Other Ambulatory Visit: Payer: Self-pay

## 2019-04-28 ENCOUNTER — Ambulatory Visit (INDEPENDENT_AMBULATORY_CARE_PROVIDER_SITE_OTHER): Payer: BC Managed Care – PPO | Admitting: Licensed Clinical Social Worker

## 2019-04-28 DIAGNOSIS — F1910 Other psychoactive substance abuse, uncomplicated: Secondary | ICD-10-CM

## 2019-04-28 DIAGNOSIS — F1590 Other stimulant use, unspecified, uncomplicated: Secondary | ICD-10-CM

## 2019-04-28 DIAGNOSIS — F411 Generalized anxiety disorder: Secondary | ICD-10-CM

## 2019-04-28 DIAGNOSIS — F1421 Cocaine dependence, in remission: Secondary | ICD-10-CM

## 2019-04-28 DIAGNOSIS — F1190 Opioid use, unspecified, uncomplicated: Secondary | ICD-10-CM

## 2019-04-28 DIAGNOSIS — F1199 Opioid use, unspecified with unspecified opioid-induced disorder: Secondary | ICD-10-CM | POA: Diagnosis not present
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
## 2019-05-06 ENCOUNTER — Other Ambulatory Visit (HOSPITAL_COMMUNITY): Payer: BC Managed Care – PPO | Attending: Psychiatry | Admitting: Licensed Clinical Social Worker

## 2019-05-06 ENCOUNTER — Other Ambulatory Visit: Payer: Self-pay

## 2019-05-06 VITALS — BP 118/70 | HR 84 | Ht 72.0 in | Wt 277.0 lb

## 2019-05-06 DIAGNOSIS — F32A Depression, unspecified: Secondary | ICD-10-CM

## 2019-05-06 DIAGNOSIS — F419 Anxiety disorder, unspecified: Secondary | ICD-10-CM | POA: Insufficient documentation

## 2019-05-06 DIAGNOSIS — Z62810 Personal history of physical and sexual abuse in childhood: Secondary | ICD-10-CM | POA: Insufficient documentation

## 2019-05-06 DIAGNOSIS — Z639 Problem related to primary support group, unspecified: Secondary | ICD-10-CM | POA: Diagnosis not present

## 2019-05-06 DIAGNOSIS — Z818 Family history of other mental and behavioral disorders: Secondary | ICD-10-CM | POA: Insufficient documentation

## 2019-05-06 DIAGNOSIS — F1590 Other stimulant use, unspecified, uncomplicated: Secondary | ICD-10-CM | POA: Diagnosis not present

## 2019-05-06 DIAGNOSIS — F1123 Opioid dependence with withdrawal: Secondary | ICD-10-CM | POA: Diagnosis present

## 2019-05-06 DIAGNOSIS — Z811 Family history of alcohol abuse and dependence: Secondary | ICD-10-CM | POA: Insufficient documentation

## 2019-05-06 DIAGNOSIS — Z807 Family history of other malignant neoplasms of lymphoid, hematopoietic and related tissues: Secondary | ICD-10-CM | POA: Diagnosis not present

## 2019-05-06 DIAGNOSIS — Z833 Family history of diabetes mellitus: Secondary | ICD-10-CM | POA: Insufficient documentation

## 2019-05-06 DIAGNOSIS — F1994 Other psychoactive substance use, unspecified with psychoactive substance-induced mood disorder: Secondary | ICD-10-CM | POA: Diagnosis not present

## 2019-05-06 DIAGNOSIS — Z62811 Personal history of psychological abuse in childhood: Secondary | ICD-10-CM | POA: Diagnosis not present

## 2019-05-06 DIAGNOSIS — F1120 Opioid dependence, uncomplicated: Secondary | ICD-10-CM | POA: Insufficient documentation

## 2019-05-06 DIAGNOSIS — F1021 Alcohol dependence, in remission: Secondary | ICD-10-CM | POA: Diagnosis not present

## 2019-05-06 DIAGNOSIS — Z7982 Long term (current) use of aspirin: Secondary | ICD-10-CM | POA: Insufficient documentation

## 2019-05-06 DIAGNOSIS — F1920 Other psychoactive substance dependence, uncomplicated: Secondary | ICD-10-CM

## 2019-05-06 DIAGNOSIS — T7492XS Unspecified child maltreatment, confirmed, sequela: Secondary | ICD-10-CM

## 2019-05-06 DIAGNOSIS — Z89021 Acquired absence of right finger(s): Secondary | ICD-10-CM | POA: Insufficient documentation

## 2019-05-06 DIAGNOSIS — F17210 Nicotine dependence, cigarettes, uncomplicated: Secondary | ICD-10-CM | POA: Diagnosis not present

## 2019-05-06 DIAGNOSIS — G4730 Sleep apnea, unspecified: Secondary | ICD-10-CM | POA: Insufficient documentation

## 2019-05-06 DIAGNOSIS — F19982 Other psychoactive substance use, unspecified with psychoactive substance-induced sleep disorder: Secondary | ICD-10-CM

## 2019-05-06 DIAGNOSIS — F1421 Cocaine dependence, in remission: Secondary | ICD-10-CM | POA: Diagnosis not present

## 2019-05-06 DIAGNOSIS — F4312 Post-traumatic stress disorder, chronic: Secondary | ICD-10-CM | POA: Insufficient documentation

## 2019-05-06 DIAGNOSIS — Z6372 Alcoholism and drug addiction in family: Secondary | ICD-10-CM | POA: Diagnosis not present

## 2019-05-06 DIAGNOSIS — F329 Major depressive disorder, single episode, unspecified: Secondary | ICD-10-CM
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
## 2019-05-07 ENCOUNTER — Other Ambulatory Visit (HOSPITAL_COMMUNITY): Payer: BC Managed Care – PPO

## 2019-05-09 ENCOUNTER — Encounter (HOSPITAL_COMMUNITY): Payer: Self-pay | Admitting: Medical

## 2019-05-11 ENCOUNTER — Other Ambulatory Visit (INDEPENDENT_AMBULATORY_CARE_PROVIDER_SITE_OTHER): Payer: BC Managed Care – PPO | Admitting: Medical

## 2019-05-11 ENCOUNTER — Encounter (HOSPITAL_COMMUNITY): Payer: Self-pay | Admitting: Medical

## 2019-05-11 ENCOUNTER — Other Ambulatory Visit (HOSPITAL_COMMUNITY): Payer: Self-pay | Admitting: Medical

## 2019-05-11 ENCOUNTER — Other Ambulatory Visit: Payer: Self-pay

## 2019-05-11 VITALS — BP 130/76 | HR 78 | Temp 98.3°F | Ht 72.0 in | Wt 275.0 lb

## 2019-05-11 DIAGNOSIS — T7492XS Unspecified child maltreatment, confirmed, sequela: Secondary | ICD-10-CM

## 2019-05-11 DIAGNOSIS — F19982 Other psychoactive substance use, unspecified with psychoactive substance-induced sleep disorder: Secondary | ICD-10-CM

## 2019-05-11 DIAGNOSIS — Z6372 Alcoholism and drug addiction in family: Secondary | ICD-10-CM

## 2019-05-11 DIAGNOSIS — F32A Depression, unspecified: Secondary | ICD-10-CM

## 2019-05-11 DIAGNOSIS — F1920 Other psychoactive substance dependence, uncomplicated: Secondary | ICD-10-CM

## 2019-05-11 DIAGNOSIS — F4312 Post-traumatic stress disorder, chronic: Secondary | ICD-10-CM

## 2019-05-11 DIAGNOSIS — F1994 Other psychoactive substance use, unspecified with psychoactive substance-induced mood disorder: Secondary | ICD-10-CM

## 2019-05-11 DIAGNOSIS — F1021 Alcohol dependence, in remission: Secondary | ICD-10-CM

## 2019-05-11 DIAGNOSIS — F1590 Other stimulant use, unspecified, uncomplicated: Secondary | ICD-10-CM

## 2019-05-11 DIAGNOSIS — F17210 Nicotine dependence, cigarettes, uncomplicated: Secondary | ICD-10-CM

## 2019-05-11 DIAGNOSIS — F1120 Opioid dependence, uncomplicated: Secondary | ICD-10-CM

## 2019-05-11 DIAGNOSIS — F329 Major depressive disorder, single episode, unspecified: Secondary | ICD-10-CM

## 2019-05-11 DIAGNOSIS — F1123 Opioid dependence with withdrawal: Secondary | ICD-10-CM | POA: Diagnosis not present

## 2019-05-11 DIAGNOSIS — F1421 Cocaine dependence, in remission: Secondary | ICD-10-CM

## 2019-05-11 DIAGNOSIS — Z811 Family history of alcohol abuse and dependence: Secondary | ICD-10-CM

## 2019-05-11 MED ORDER — CLONIDINE HCL 0.2 MG PO TABS: 0.2000 mg | ORAL_TABLET | Freq: Three times a day (TID) | ORAL | 1 refills | Status: DC

## 2019-05-11 MED ORDER — ONDANSETRON 8 MG PO TBDP: 8.0000 mg | ORAL_TABLET | Freq: Three times a day (TID) | ORAL | Status: DC | PRN

## 2019-05-11 MED ORDER — DICYCLOMINE HCL 10 MG PO CAPS: 10.0000 mg | ORAL_CAPSULE | Freq: Three times a day (TID) | ORAL | 0 refills | Status: DC

## 2019-05-11 MED ORDER — CYCLOBENZAPRINE HCL 10 MG PO TABS: 10.0000 mg | ORAL_TABLET | Freq: Three times a day (TID) | ORAL | 1 refills | Status: DC | PRN

## 2019-05-11 MED ORDER — LOPERAMIDE HCL 2 MG PO CAPS: 2.0000 mg | ORAL_CAPSULE | ORAL | 0 refills | Status: AC | PRN

## 2019-05-11 MED ORDER — NALTREXONE HCL 50 MG PO TABS: 50.0000 mg | ORAL_TABLET | Freq: Every day | ORAL | 3 refills | Status: DC
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
## 2019-05-13 ENCOUNTER — Other Ambulatory Visit (INDEPENDENT_AMBULATORY_CARE_PROVIDER_SITE_OTHER): Payer: BC Managed Care – PPO | Admitting: Licensed Clinical Social Worker

## 2019-05-13 ENCOUNTER — Other Ambulatory Visit: Payer: Self-pay

## 2019-05-13 DIAGNOSIS — F1123 Opioid dependence with withdrawal: Secondary | ICD-10-CM | POA: Diagnosis not present

## 2019-05-13 DIAGNOSIS — F1120 Opioid dependence, uncomplicated: Secondary | ICD-10-CM

## 2019-05-13 DIAGNOSIS — F1920 Other psychoactive substance dependence, uncomplicated: Secondary | ICD-10-CM

## 2019-05-13 NOTE — Progress Notes (Signed)
Patient ID: Kevin Andrade, male   DOB: [birth year], 48 y.o.   MRN: [id number] FU on his withdrawal protocol and decision about Vivitrol. He has started withdrawal meds except Clonidine which wasnt available (says pharmacy has gotten it now) He is sleeping and his pain is not chronic but rather drug induced.He is not feeling depressed and doesnt feel need for medication for mood.Will see him again Monday. Reminded him Kratom withdrawal

## 2019-05-14 ENCOUNTER — Other Ambulatory Visit (HOSPITAL_COMMUNITY): Payer: BC Managed Care – PPO | Admitting: Licensed Clinical Social Worker

## 2019-05-14 DIAGNOSIS — F1590 Other stimulant use, unspecified, uncomplicated: Secondary | ICD-10-CM

## 2019-05-14 DIAGNOSIS — F1994 Other psychoactive substance use, unspecified with psychoactive substance-induced mood disorder: Secondary | ICD-10-CM

## 2019-05-14 DIAGNOSIS — F1123 Opioid dependence with withdrawal: Secondary | ICD-10-CM | POA: Diagnosis not present

## 2019-05-15 NOTE — Progress Notes (Signed)
    Daily Group Progress Note  Program: CD-IOP   Group Time: 1pm-2:30pm  Participation Level: Active  Behavioral Response: Appropriate  Type of Therapy: Process Group  Topic: Clinician met with clients, assessing for SI/HI/psychosis and overall level of functioning including attendance of recovery meetings and relapse or challenges to sobriety. Clinician and group members discussed highs and lows from previous days and reflection on topic from previous day. Clinician and group members read Daily Reflection and clinician facilitated discussion on fear in recovery.   Group Time: 2:30pm-4pm  Participation Level: Active  Behavioral Response: Appropriate  Type of Therapy: Psycho-education Group  Topic: Clinician provided psycho-educational group on PAWS. Clinician provided supplemental video with discussion on common PAWS symptoms and skills to help with management. Clinician utilized Post Acute Withdrawal (PAW) Self-Evaluation developed by Patrina Levering. Clinician and group members discussed the importance of being aware and tracking symptoms during recovery.    Summary: Client admitted using Adderall over the weekend when feeling like he needed motivation. Client sobriety date 05/10/19. Client shared fears related to seeking treatment after relapse as well as consequences of sharing story with others. Client stated attending CDIOP and 12 step meetings have bee helpful and talking about things with strangers and finding community and not feeling alone in struggles. Client related to PAWS information currently and with relapse. Client shows progress toward goals as evidence by identifying unhelpful thoughts/feelings and not 'taking behaviors out' on others.   Family Program: Family present? No   Name of family member(s): n/a  UDS collected: No Results: from 05/11/19 UDS: Amphetamine (Adderall reported), Cotinine, Mitragynine (Kratom) [reported]   AA/NA attended?: Yes 1  Sponsor?:  NA   Yazen Rosko A Carol Theys, LCSW, LCAS

## 2019-05-15 NOTE — Progress Notes (Signed)
    Daily Group Progress Note  Program: CD-IOP   Group Time: 1pm-2:30pm  Participation Level: Active  Behavioral Response: Appropriate  Type of Therapy: Process Group  Topic: Clinician checked in with group members, assessing for SI/HI/psychosis and overall level of functioning including relapse and barriers to recovery. Clinician and group members discussed highlights and struggles since last group related to maintaining sobriety including identified triggers and responses. Clinician and group members processed Daily Reflection and  personal meeting to current work in recovery.    Group Time: 2:30pm-4pm  Participation Level: Active  Behavioral Response: Appropriate  Type of Therapy: Psycho-education Group  Topic: Clinician provided mindfulness activity in session for participation of clients. Clinician presented the psycho-education topic of Resentment and Forgiveness. Clinician and group members discussed roles of resentment in early recovery. Clinician and group members identified a small resentment and described related thoughts, behaviors, and physical sensations.   Summary: Client attends first group session since assessment on 04/28/19. Client reports being nervous but acknowledging that after relapse he does need more support to manage his recovery. Client is receptive to group members feedback. Client shared resentment in recovery that has carried over since childhood related to family members. Client reports this will not be easy to work through and is unsure at this time if he is willing to consider forgiveness in the future. Client identifies that for resentment to resolved he would need to see changed behaviors and continue working on his own thoughts first. See PA note for additional details.   Family Program: Family present? No   Name of family member(s): N/A client is open to including his wife in family therapy  UDS collected: Yes Results: from 05/06/19: amphetamine,  kratom. Client reports sobriety date is 05/04/19, reporting using when frustrated over the weekend.    AA/NA attended?: No; first day of group. Client is open to 12 step community groups  Sponsor?: No   Kenleigh Toback A Zerina Hallinan, LCSW, LCAS

## 2019-05-18 ENCOUNTER — Other Ambulatory Visit (HOSPITAL_COMMUNITY): Payer: BC Managed Care – PPO | Admitting: Licensed Clinical Social Worker

## 2019-05-18 ENCOUNTER — Ambulatory Visit (HOSPITAL_COMMUNITY): Payer: BC Managed Care – PPO | Admitting: Licensed Clinical Social Worker

## 2019-05-18 ENCOUNTER — Encounter (HOSPITAL_COMMUNITY): Payer: Self-pay | Admitting: Medical

## 2019-05-18 ENCOUNTER — Other Ambulatory Visit: Payer: Self-pay

## 2019-05-18 DIAGNOSIS — F1123 Opioid dependence with withdrawal: Secondary | ICD-10-CM | POA: Diagnosis not present

## 2019-05-18 DIAGNOSIS — F1120 Opioid dependence, uncomplicated: Secondary | ICD-10-CM

## 2019-05-18 DIAGNOSIS — F1590 Other stimulant use, unspecified, uncomplicated: Secondary | ICD-10-CM

## 2019-05-18 NOTE — Progress Notes (Signed)
    Daily Group Progress Note  Program: CD-IOP   Group Time: 1pm-2pm  Participation Level: Active  Behavioral Response: Appropriate  Type of Therapy: Process Group  Topic: Clinician checked in with group members, assessing for SI/HI/psychosis and overall level of functioning including relapse and barriers to recovery. Clinician and group members discussed highlights and struggles since last group related to maintaining sobriety including identified triggers and responses. Clinician and group members processed Daily Reflection and  personal meeting to current work in recovery. Clinician provided mindfulness activity.   Group Time: 2pm-4pm  Participation Level: Active  Behavioral Response: Appropriate  Type of Therapy: Psycho-education Group  Topic: Clinician and group members reviewed accomplishments and needs, including physical, emotional, cognitive, and social needs. Clinician and group members discussed what it looks like to have needs met, and what it looks like when their personal needs are not met. Clinician facilitated discussion on self-awareness of feelings, unmet needs, and fears related to asking for help. Clients provided homework of completing My Needs Instructional Guide to discuss following group.    Summary: Client engaged in group discussion, identifying what went well over the weekend and this was accomplished by giving himself a break rather than overloading himself with tasks. 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 Client identified sometimes not wanting to feel like a burden is a reason he would keep his needs to himself. Client shared in discussion a need being met by CDIOP and AA included working on self acceptance and forgiveness as well as feeling understood and supported rather than alone.  Client sobriety date remains 05/09/19.   Family Program: Family present? No   Name of family member(s): NA  UDS  collected: Yes Results: Previous UDS: amphetamine (adderall) and Kratom; both reported previous week.  AA/NA attended?: Yes; 1 mtg  Sponsor?: No   Vada Swift A Skipper Dacosta, LCSW, LCAS

## 2019-05-18 NOTE — Progress Notes (Signed)
   THERAPIST PROGRESS NOTE  Session Time: 11am-11:30am  Participation Level: Active  Behavioral Response: CasualAlertEuthymic  Type of Therapy: Individual Therapy  Treatment Goals addressed: Coping and Diagnosis: Client with achieve and maintain sobriety 7/7 days per week. Client will impliment health coping skills to address residual anxiety symptoms and increase motivation at least 1x daily at least 4 days per week.  Interventions: CBT, Motivational Interviewing and Supportive; Individual therapy as a part   Summary: Kevin Andrade is a 48 y.o. male who presents with substance use disorder. Client reporting being more mindful of not needing substances for motivation to complete work and is accepting it is 'normal' to have 'less productive' days. Client reports improving communication with his wife and improving self confidence. Client notes because he will be returning to a physical job, when he is having ruminating thoughts he will complete yardwork or other physical activities to keep his mind occupied.   Suicidal/Homicidal: Nowithout intent/plan  Therapist Response: Clinician met with client, assessing for SI/HI/psychosis and overall level of functioning including substance use. Clinician checked in with client, inquiring about events of the weekend and barriers to recovery. Clinician praised client use of self care and mindfulness of body to gauge activities completed over the weekend. Clinician and client discussed shift in need to complete tasks as a sign of accomplishment and success.  Plan: Return again in 1 weeks.  Diagnosis: Axis I: Substance Abuse       Olegario Messier, LCSW 05/18/2019

## 2019-05-18 NOTE — Progress Notes (Signed)
Patient ID: Kevin Andrade, male   DOB: [birth year], 48 y.o.   MRN: [id number] Brief check at group.Pt's withdrawal is over and he is stable ,taking medications as directed without complaint/problem.

## 2019-05-19 NOTE — Progress Notes (Signed)
    Daily Group Progress Note  Program: CD-IOP   Group Time: 1pm-2:30pm  Participation Level: Active  Behavioral Response: Appropriate  Type of Therapy: Psycho-education Group  Topic:  Clinician checked in with group members assessing for SI/HI/psychosis and overall level of functioning including sobriety date and community support meetings attended. Pharmacist facilitated psycho-educational group on medications used to treat addiction as well as mental health symptoms. Clients were provided time to ask questions.    Group Time: 2:30-4pm  Participation Level: Active  Behavioral Response: Appropriate  Type of Therapy: Process Group  Topic: Clinician facilitated process group. Clinician and group members processed recent stressors. Clinician facilitated discussion on Letting Go of Emotional Suffering: Mindfulness of Your Current Emotion. Clinician and group members discussed observing and experiencing emotions without judgement or acting on urges.   Summary: Client engages in group discussions, identifying physical sensations of uncomfortable emotions. Client actively listened to pharmacist. Client shared thoughts on judgments of urges and wanting to get back to 'himself' which does not include cravings. Client has identified that there will end up being a new version of himself, not the exact same as prior to substance use.   Family Program: Family present? No   Name of family member(s): NA  UDS collected: No   AA/NA attended?: Yes  Sponsor?: No   Olegario Messier, LCSW

## 2019-05-20 ENCOUNTER — Other Ambulatory Visit: Payer: Self-pay

## 2019-05-20 ENCOUNTER — Other Ambulatory Visit (HOSPITAL_COMMUNITY): Payer: BC Managed Care – PPO

## 2019-05-20 ENCOUNTER — Encounter (HOSPITAL_COMMUNITY): Payer: Self-pay | Admitting: Licensed Clinical Social Worker

## 2019-05-21 ENCOUNTER — Other Ambulatory Visit: Payer: Self-pay

## 2019-05-21 ENCOUNTER — Other Ambulatory Visit (HOSPITAL_COMMUNITY): Payer: BC Managed Care – PPO | Admitting: Licensed Clinical Social Worker

## 2019-05-21 DIAGNOSIS — F1120 Opioid dependence, uncomplicated: Secondary | ICD-10-CM

## 2019-05-21 DIAGNOSIS — F1123 Opioid dependence with withdrawal: Secondary | ICD-10-CM | POA: Diagnosis not present

## 2019-05-21 DIAGNOSIS — F1994 Other psychoactive substance use, unspecified with psychoactive substance-induced mood disorder: Secondary | ICD-10-CM

## 2019-05-21 DIAGNOSIS — F1590 Other stimulant use, unspecified, uncomplicated: Secondary | ICD-10-CM

## 2019-05-25 ENCOUNTER — Other Ambulatory Visit (HOSPITAL_COMMUNITY): Payer: BC Managed Care – PPO | Admitting: Licensed Clinical Social Worker

## 2019-05-25 ENCOUNTER — Other Ambulatory Visit: Payer: Self-pay

## 2019-05-25 ENCOUNTER — Ambulatory Visit (HOSPITAL_COMMUNITY): Payer: BC Managed Care – PPO | Admitting: Licensed Clinical Social Worker

## 2019-05-25 DIAGNOSIS — F1190 Opioid use, unspecified, uncomplicated: Secondary | ICD-10-CM

## 2019-05-25 DIAGNOSIS — F1199 Opioid use, unspecified with unspecified opioid-induced disorder: Secondary | ICD-10-CM

## 2019-05-25 DIAGNOSIS — F411 Generalized anxiety disorder: Secondary | ICD-10-CM

## 2019-05-25 DIAGNOSIS — F1123 Opioid dependence with withdrawal: Secondary | ICD-10-CM | POA: Diagnosis not present

## 2019-05-25 NOTE — Progress Notes (Signed)
   THERAPIST PROGRESS NOTE  Session Time: 10am-10:40am  Participation Level: Active  Behavioral Response: CasualAlertAnxious  Type of Therapy: Individual Therapy; Individual therapy in conjunction with CDIOP  Treatment Goals addressed: Coping and Diagnosis: Achieve and maintain sobriety 7/7 days per week. Increase use of healthy coping skills to manage symptoms of anxiety at least 1xdaily at least 4daysxweek  Interventions: CBT, Motivational Interviewing and Supportive  Summary: Kevin Andrade is a 48 y.o. male who presents with opioid use disorder and symptoms of anxiety. Client shares activities of the weekend, identifying several moments of anxiety which he noticed physically and sat with. Client states he is communicating more with his wife, sharing thoughts and feelings which he identifies as surprising and good. Client is receptive to breathing exercise and agrees to practice while more calm to help gain mastery. Client will attend CDIOP group at 1pm  Suicidal/Homicidal: Nowithout intent/plan  Therapist Response: Clinician met with client, assessing for SI/HI/psychosis and overall level of functioning. Clinician discussed with clent reported mood swings from the weekend. Clinician inquired about any changes in sleep, appetite, and anxiety levels. Clinician and client processed identifying specific ruminating thoughts and creating alternative thoughts which decrease anxiety by altering perspective. Clinician addressed cognitive distortions with fact checking. Clinician and client planned topic for family therapy. Clinician provided client with 5-7-9 breathing grounding excercise to help manage clients physical feelings of anxiety as he reported previous meditations focused on breathing were helpful.  Plan: Return again in 1 weeks.  Diagnosis: Axis I: Substance Abuse         A , LCSW 05/25/2019  

## 2019-05-25 NOTE — Progress Notes (Signed)
    Daily Group Progress Note  Program: CD-IOP   Group Time: 1pm-2:30pm  Participation Level: Active  Behavioral Response: Appropriate  Type of Therapy: Process Group  Topic: Clinician checked in with group members, assessing for SI/HI/psychosis and overall level of functioning. Clinician inquired about highs and lows of the weekend including barriers to sobriety and coping skills attempted. Clinician and group members read and processed Daily Reflection and how it applies to current engagement in recovery. Clinician provided guided visualization activity.   Group Time: 2:30pm-4pm  Participation Level: Active  Behavioral Response: Appropriate  Type of Therapy: Psycho-education Group  Topic: Clinician and group members reviewed communication and boundaries discussions from previous week. Clinician and group members role played using assertive communication for setting boundaries with friends and family. Clinician presented the topic of Values. Clinician and group members engage in activity to identify core values and discussed how this effects where boundaries are set. Clinician and group members discussed how priorities and boundaries have changed with life circumstances and the importance of being surrounded by people with similar values for support as well as the consequences of being around people who have opposite values, unsupportive of client recovery. Clinician inquired about plans for self-care and to support recovery before next group.   Summary: Client engaged in group discussions appropriately. Client shared 'highs' and 'lows' from the weekend and processed with group struggle of craving thoughts when alone and not doing anything. Client identified admiring his wife and father in law and that family was a core value. Client sobriety date remains 05/10/19, 1 community meeting attended since last group therapy. Client is making progress toward goals as evidence by maintaining  sobriety despite uncomfortable thoughts and feelings and remaining engaged in recovery focused activities.   Family Program: Family present? No   Name of family member(s): N/A family session in 2 weeks with wife  UDS collected: Yes Results: pending  AA/NA attended?: Yes  Sponsor?: No   Karissa A Brone, LCSW, LCAS

## 2019-05-26 NOTE — Progress Notes (Signed)
    Daily Group Progress Note  Program: CD-IOP   Group Time: 1pm-2:30pm  Participation Level: Active  Behavioral Response: Appropriate  Type of Therapy: Process Group  Topic: Clinician checked in with clients, assessing for SI/HI/psychosis and overall level of functioning. Clinician and group members discussed highs and lows from previous day and activities completed to support recovery. Clinician and group members read and processed Daily Reflection. Clinician provided mindfulness activity.   Group Time: 2:30pm-4pm  Participation Level: Active  Behavioral Response: Appropriate  Type of Therapy: Psycho-education Group  Topic: Clinician and group members began discussing personal boundaries and aspects of healthy vs unhealthy boundaries. Clinician and group members discussed types of boundaries and how substance use or sobriety has effected acceptable boundaries. Co-facilitator from [facility] provided psycho-educational material on healthy sleep hygiene, exercise, and nutrition to support changes in long term recovery.    Summary: Client engaged in mindfulness activity, reporting most calming and helpful for short term focus but often struggles with wandering thoughts. Client is able to identify boundaries he has set, such as avoiding friends and previously used with and only being around family. Client reports continuing to keep himself busy to avoid too much free time for thinking which lead to unhelpful thoughts and often cravings. Client will be helping wife over the weekend with planning a BBQ. Client shared he and his wife plan on starting walking together to improve health and increase positive quality time. Client also reports he had added stretching in the morning to help with his back pain.   Family Program: Family present? No   Name of family member(s): n/a  UDS collected: No Results: n/a  AA/NA attended?: Yes  Sponsor?: No   Olegario Messier,  LCSW

## 2019-05-27 ENCOUNTER — Other Ambulatory Visit (HOSPITAL_COMMUNITY): Payer: BC Managed Care – PPO | Admitting: Licensed Clinical Social Worker

## 2019-05-27 ENCOUNTER — Other Ambulatory Visit: Payer: Self-pay

## 2019-05-27 DIAGNOSIS — F1590 Other stimulant use, unspecified, uncomplicated: Secondary | ICD-10-CM

## 2019-05-27 DIAGNOSIS — F1120 Opioid dependence, uncomplicated: Secondary | ICD-10-CM

## 2019-05-27 DIAGNOSIS — F1123 Opioid dependence with withdrawal: Secondary | ICD-10-CM | POA: Diagnosis not present

## 2019-05-27 NOTE — Progress Notes (Signed)
    Daily Group Progress Note  Program: CD-IOP   Group Time: 1pm-2:30pm  Participation Level: Active  Behavioral Response: Appropriate, Sharing and Motivated  Type of Therapy: Process Group  Topic: Clinician checked in with group members, assessing for SI/HI/psychosis and overall level of functioning including substance use and community support group attendance. Clinician and group members processed any barriers to recovery since previous group. Clinician and group members reviewed 'Adult Core Belief Clusters' and discussed intensity of beliefs of adaptive vs irrational thoughts. Clinician and group members processed values and acceptable behaviors modeled growing up and how this shaped beliefs. Clinician and group members discussed how those thoughts and behaviors affected their own parenting styles.   Group Time: 2:30pm-4pm  Participation Level: Active  Behavioral Response: Appropriate  Type of Therapy: Psycho-education Group  Topic: Psycho-educational material provided on Laughter Yoga. Clinician and group members watched material on Laughter Yoga Movement by Dr. Nelda Severe. Clinician and group members participated in laughter activity in session. Clinician provided additional 5-7-9 breathing technique. Clinician and group members discussed types of community support groups including AA, NA [facility] and [facility].   Summary: Sobriety date of 05/10/19, 2 mtgs attended since last session. Client rated intensity of believe of core beliefs and identified incidents which he believes supported this thought pattern growing up. Client participated in laughter yoga, reporting it was enjoyable and did improve mood in the moment. Client is receptive to breathing activities and reports he has been attempting to use breathing skill when feeling anxious. Client reports he will be contacting [facility] to inquire about meeting engagement. Client shows progress toward goals as  evidence by maintaining sobriety, attending community meetings to build sober support system, and attempting coping skills daily.   Family Program: Family present? No   Name of family member(s): NA  UDS collected: No Results: pending  AA/NA attended?: Yes  Sponsor?: No   Auren Valdes A Casidee Jann, LCSW, LCAS

## 2019-05-28 ENCOUNTER — Other Ambulatory Visit (HOSPITAL_COMMUNITY): Payer: BC Managed Care – PPO | Attending: Psychiatry | Admitting: Licensed Clinical Social Worker

## 2019-05-28 ENCOUNTER — Other Ambulatory Visit: Payer: Self-pay

## 2019-05-28 DIAGNOSIS — Z6379 Other stressful life events affecting family and household: Secondary | ICD-10-CM | POA: Insufficient documentation

## 2019-05-28 DIAGNOSIS — F1021 Alcohol dependence, in remission: Secondary | ICD-10-CM | POA: Diagnosis not present

## 2019-05-28 DIAGNOSIS — F1994 Other psychoactive substance use, unspecified with psychoactive substance-induced mood disorder: Secondary | ICD-10-CM

## 2019-05-28 DIAGNOSIS — F418 Other specified anxiety disorders: Secondary | ICD-10-CM | POA: Diagnosis not present

## 2019-05-28 DIAGNOSIS — F1510 Other stimulant abuse, uncomplicated: Secondary | ICD-10-CM | POA: Insufficient documentation

## 2019-05-28 DIAGNOSIS — Z79899 Other long term (current) drug therapy: Secondary | ICD-10-CM | POA: Insufficient documentation

## 2019-05-28 DIAGNOSIS — F1123 Opioid dependence with withdrawal: Secondary | ICD-10-CM | POA: Diagnosis present

## 2019-05-28 DIAGNOSIS — F1590 Other stimulant use, unspecified, uncomplicated: Secondary | ICD-10-CM

## 2019-05-28 DIAGNOSIS — Z7982 Long term (current) use of aspirin: Secondary | ICD-10-CM | POA: Insufficient documentation

## 2019-05-28 DIAGNOSIS — F1120 Opioid dependence, uncomplicated: Secondary | ICD-10-CM | POA: Diagnosis present

## 2019-05-28 NOTE — Progress Notes (Signed)
    Daily Group Progress Note  Program: CD-IOP   Group Time: 1pm-2:30pm  Participation Level: Active  Behavioral Response: Appropriate  Type of Therapy: Process Group  Topic: Clinician checked in with group members, assessing for SI/HI/psychosis and overall level of functioning including triggers, cravings, and community meetings attended. Clinician and group members processed highs and lows from previous day and reviewed thoughts from previous topics of development of core beliefs. Clinician and group members read and discussed daily reflection. Clinician provided mindfulness activity.   Group Time: 2:30pm-4pm  Participation Level: Active  Behavioral Response: Appropriate  Type of Therapy: Psycho-education Group  Topic: Clinician presented the topic of Family Roles. Clinician and group members reviewed family roles and discussed how family members, including self, fit into different roles. Clinician and group members reviewed parenting report card and how these behaviors being modeled effected how they interacted with their family of origin. Clinician and group members created family trees to track patterns of behaviors. Clinician provided group members with multiple breathing exercises to try over the weekend.    Summary: Sobriety date 05/10/19, 1 meeting attended. Client engaged appropriately in group discussions and mindfulness activity as well as was able to identify several family patterns. Client identified where he fit in family and how this is reflected in his current family. Client shows progress toward goals AEB maintaining sobriety, use of grounding skills outside of session, and building of support community. Client continues to struggle with waking up anxious some days and lasting throughout the day. Client also reports continuing to stay busy to avoid racing thoughts.   Family Program: Family present? No   Name of family member(s): NA  UDS collected: No   AA/NA  attended?: Yes  Sponsor?: No    A , LCSW, LCAS

## 2019-06-01 ENCOUNTER — Other Ambulatory Visit (INDEPENDENT_AMBULATORY_CARE_PROVIDER_SITE_OTHER): Payer: BC Managed Care – PPO | Admitting: Licensed Clinical Social Worker

## 2019-06-01 ENCOUNTER — Other Ambulatory Visit: Payer: Self-pay

## 2019-06-01 ENCOUNTER — Encounter (HOSPITAL_COMMUNITY): Payer: Self-pay | Admitting: Licensed Clinical Social Worker

## 2019-06-01 DIAGNOSIS — F418 Other specified anxiety disorders: Secondary | ICD-10-CM | POA: Diagnosis not present

## 2019-06-01 DIAGNOSIS — F1021 Alcohol dependence, in remission: Secondary | ICD-10-CM

## 2019-06-01 DIAGNOSIS — F1120 Opioid dependence, uncomplicated: Secondary | ICD-10-CM

## 2019-06-01 DIAGNOSIS — F1590 Other stimulant use, unspecified, uncomplicated: Secondary | ICD-10-CM

## 2019-06-01 DIAGNOSIS — F1920 Other psychoactive substance dependence, uncomplicated: Secondary | ICD-10-CM

## 2019-06-01 DIAGNOSIS — Z6372 Alcoholism and drug addiction in family: Secondary | ICD-10-CM

## 2019-06-01 MED ORDER — ESCITALOPRAM OXALATE 20 MG PO TABS: 20.0000 mg | ORAL_TABLET | Freq: Every day | ORAL | 1 refills | Status: DC

## 2019-06-01 MED ORDER — PREGABALIN 150 MG PO CAPS: 150.0000 mg | ORAL_CAPSULE | Freq: Three times a day (TID) | ORAL | 1 refills | Status: DC

## 2019-06-01 NOTE — Progress Notes (Addendum)
   [facility] Follow-up Outpatient CDIOP Date: 06/01/2019  Admission Date: 05/06/2019  Sobriety date: ?05/03/19 (9/28 UDS + for Kratom which like THC is fat soluble)  Subjective: "I'm good.Irish Lack really"  HPI: Provider FU for c/o worsening anxiety  Patient has begun to experience increasing anxiety and dysthymia now some 3 weeks after admission for Kratom/opiate dependencies. He initially was not placed on any psycvhotropic medications as he initially was experiencing withde rawal symptoms and was placed on non opite (Clonidine) withdrawal protocol. His PHQ 9 and GAD 7 screens were repeared today and show persistent sevre depression without SI and slight worsening of his severe anxiety. He has been on Naltrexone therapy since admission.  Review of Systems: Psychiatric: Agitation: Severe anxious depression Hallucination: No Depressed Mood: PHQ 9 score 21 Insomnia: Yes Hypersomnia: No Altered Concentration:Yes Feels Worthless: Yes Grandiose Ideas: No Belief In Special Powers: No New/Increased Substance Abuse: ? Difficult to assess with current UDS-does not provide concentration beyond detection levels Compulsions: cravings  Neurologic: Headache: No Seizure: No Paresthesias: No  Current Medications: aspirin EC 81 MG tablet Take 81 mg by mouth daily.  cloNIDine 0.2 MG tablet Commonly known as: CATAPRES Take 1 tablet (0.2 mg total) by mouth 3 (three) times daily.  cyclobenzaprine 10 MG tablet Commonly known as: FLEXERIL Take 1 tablet (10 mg total) by mouth 3 (three) times daily as needed for muscle spasms.  dicyclomine 10 MG capsule Commonly known as: BENTYL Take 1 capsule (10 mg total) by mouth 3 (three) times daily before meals.  escitalopram 20 MG tablet Commonly known as: Lexapro Take 1 tablet (20 mg total) by mouth daily.  ibuprofen 200 MG tablet Commonly known as: ADVIL Take 200 mg by mouth every 6 (six) hours as needed.  naltrexone 50 MG tablet Commonly  known as: DEPADE Take 1 tablet (50 mg total) by mouth daily.  ondansetron 8 MG disintegrating tablet Commonly known as: ZOFRAN-ODT Take 1 tablet (8 mg total) by mouth every 8 (eight) hours as needed for nausea or vomiting.  pregabalin 150 MG capsule Commonly known as: LYRICA Take 1 capsule (150 mg total) by mouth 3 (three) times daily.  traZODone 50 MG tablet Commonly known       Mental Status Examination  Appearance:Anxious/clean/well groomed Alert: Yes Attention: good  Cooperative: Yes Eye Contact: Good Speech: Clear and coherent Psychomotor Activity: Slight restlessness Memory/Concentration: Normal/intact Oriented: person, place, time/date and situation Mood: Dysphoric Affect: Appropriate and Congruent Thought Processes and Associations: Coherent and Intact Fund of Knowledge: WDL Thought Content: WDL/ low self esteem/excessive worry Insight:lacking Judgement: limited  UDS:05/25/19-+ for expected (Naltrexone/Nicotine) And for illicit Kratom but cannot discern if ptb has used recently or if his body is clearing heavy usage from fat cells like THC users  Diagnosis:  0 Opioid use disorder, severe, dependence ([name]) 0 Stimulant use disorder 0 Polydrug dependence including opioid type drug, continuous use ([name]) 0 Alcohol use disorder, moderate, in sustained remission ([name]) 0 Dysfunctional family due to alcoholism  Assessment: Mental status unimproved after detox. Anxious depression severe.? Sobriety date  Treatment Plan:per admission Also contact lab to see if Kratom levels can be quantified ? Rx Lexapro;Lyrica and Trazodone FU 1 week [name], PA-C

## 2019-06-01 NOTE — Progress Notes (Signed)
    Daily Group Progress Note  Program: CD-IOP   Group Time: 1pm-2:30pm  Participation Level: Active  Behavioral Response: Appropriate  Type of Therapy: Psycho-education Group  Topic: Clinician checked in with clients, assessing for SI/HI/psychosis and overall level of functioning. Clinician inquired about successes and barriers to recovery experienced over the weekend.  Group was co-facilitated to include yoga and mindfulness to support recovery.   Group Time: 2:30pm-4pm  Participation Level: Active  Behavioral Response: Appropriate and Sharing  Type of Therapy: Process Group  Topic: Clinician and group members reviewed information on Adult Children of Alcoholics. Clinician and group members processed traits developed and effect on current relationship behaviors. Clinician and group members processed specific recent examples of ACOA traits and previously reviewed distorted thinking styles. Clinician and group members discussed how these dynamics effected their continued use or support of recovery.   Summary: Sobriety date: 05/10/19, 1 mtg attended since last session. Client reports overall positive weekend, completing chores and allowing himself time to relax. Client engaged in yoga activity appropriately. Client identified several personal traits when discussing ACOA. Client shared recent altercation with his son and daughter in law. Client identified dysfunctional behaviors and distorted thinking from DIL when client attempted to set a boundary and the consequence was not being able to see his grandchildren. Client identified that because of keeping opinions and feelings bottled up to keep the peace, it is possible he comes across as aggressive when 'speaking his mind.' Client identifies ongoing self criticism as problematic and continues to want to appear 'strong' and 'not a burden' to his wife, which he reports is the reason he does not always share things. Client is open to  including I-statements specifically identifying feelings and asking questions during conversations to improve communication with his wife.   Family Program: Family present? No   Name of family member(s): NA  UDS collected: No   AA/NA attended?: Yes  Sponsor?: No   Kevin Andrade A Reinhardt Licausi, LCSW, LCAS

## 2019-06-03 ENCOUNTER — Other Ambulatory Visit (HOSPITAL_COMMUNITY): Payer: BC Managed Care – PPO | Admitting: Licensed Clinical Social Worker

## 2019-06-03 ENCOUNTER — Other Ambulatory Visit: Payer: Self-pay

## 2019-06-03 ENCOUNTER — Ambulatory Visit (INDEPENDENT_AMBULATORY_CARE_PROVIDER_SITE_OTHER): Payer: BC Managed Care – PPO | Admitting: Licensed Clinical Social Worker

## 2019-06-03 DIAGNOSIS — F1590 Other stimulant use, unspecified, uncomplicated: Secondary | ICD-10-CM

## 2019-06-03 DIAGNOSIS — F1120 Opioid dependence, uncomplicated: Secondary | ICD-10-CM

## 2019-06-03 DIAGNOSIS — F1021 Alcohol dependence, in remission: Secondary | ICD-10-CM

## 2019-06-03 DIAGNOSIS — F1994 Other psychoactive substance use, unspecified with psychoactive substance-induced mood disorder: Secondary | ICD-10-CM

## 2019-06-03 DIAGNOSIS — F418 Other specified anxiety disorders: Secondary | ICD-10-CM | POA: Diagnosis not present

## 2019-06-03 DIAGNOSIS — F411 Generalized anxiety disorder: Secondary | ICD-10-CM

## 2019-06-03 MED ORDER — TRAZODONE HCL 50 MG PO TABS: 50.0000 mg | ORAL_TABLET | Freq: Every evening | ORAL | 1 refills | Status: DC | PRN

## 2019-06-03 NOTE — Addendum Note (Signed)
Addended by: Dara Hoyer on: 06/03/2019 11:48 AM   Modules accepted: Orders

## 2019-06-04 ENCOUNTER — Other Ambulatory Visit (HOSPITAL_COMMUNITY): Payer: BC Managed Care – PPO | Admitting: Licensed Clinical Social Worker

## 2019-06-04 ENCOUNTER — Other Ambulatory Visit: Payer: Self-pay

## 2019-06-04 DIAGNOSIS — F1994 Other psychoactive substance use, unspecified with psychoactive substance-induced mood disorder: Secondary | ICD-10-CM

## 2019-06-04 DIAGNOSIS — F1120 Opioid dependence, uncomplicated: Secondary | ICD-10-CM

## 2019-06-04 DIAGNOSIS — F411 Generalized anxiety disorder: Secondary | ICD-10-CM

## 2019-06-04 DIAGNOSIS — F418 Other specified anxiety disorders: Secondary | ICD-10-CM | POA: Diagnosis not present

## 2019-06-04 DIAGNOSIS — F1021 Alcohol dependence, in remission: Secondary | ICD-10-CM

## 2019-06-04 DIAGNOSIS — F1590 Other stimulant use, unspecified, uncomplicated: Secondary | ICD-10-CM

## 2019-06-04 DIAGNOSIS — F331 Major depressive disorder, recurrent, moderate: Secondary | ICD-10-CM

## 2019-06-08 ENCOUNTER — Encounter (HOSPITAL_COMMUNITY): Payer: Self-pay | Admitting: Medical

## 2019-06-08 ENCOUNTER — Other Ambulatory Visit (HOSPITAL_COMMUNITY): Payer: BC Managed Care – PPO | Admitting: Licensed Clinical Social Worker

## 2019-06-08 DIAGNOSIS — F17210 Nicotine dependence, cigarettes, uncomplicated: Secondary | ICD-10-CM

## 2019-06-08 DIAGNOSIS — F329 Major depressive disorder, single episode, unspecified: Secondary | ICD-10-CM

## 2019-06-08 DIAGNOSIS — T7492XS Unspecified child maltreatment, confirmed, sequela: Secondary | ICD-10-CM

## 2019-06-08 DIAGNOSIS — F418 Other specified anxiety disorders: Secondary | ICD-10-CM | POA: Diagnosis not present

## 2019-06-08 DIAGNOSIS — F1120 Opioid dependence, uncomplicated: Secondary | ICD-10-CM

## 2019-06-08 DIAGNOSIS — F1994 Other psychoactive substance use, unspecified with psychoactive substance-induced mood disorder: Secondary | ICD-10-CM

## 2019-06-08 DIAGNOSIS — Z811 Family history of alcohol abuse and dependence: Secondary | ICD-10-CM

## 2019-06-08 DIAGNOSIS — F32A Depression, unspecified: Secondary | ICD-10-CM

## 2019-06-08 DIAGNOSIS — F1920 Other psychoactive substance dependence, uncomplicated: Secondary | ICD-10-CM

## 2019-06-08 DIAGNOSIS — Z6372 Alcoholism and drug addiction in family: Secondary | ICD-10-CM

## 2019-06-08 DIAGNOSIS — F411 Generalized anxiety disorder: Secondary | ICD-10-CM

## 2019-06-08 DIAGNOSIS — F4312 Post-traumatic stress disorder, chronic: Secondary | ICD-10-CM

## 2019-06-08 DIAGNOSIS — F1021 Alcohol dependence, in remission: Secondary | ICD-10-CM

## 2019-06-08 DIAGNOSIS — F1590 Other stimulant use, unspecified, uncomplicated: Secondary | ICD-10-CM

## 2019-06-08 DIAGNOSIS — F19982 Other psychoactive substance use, unspecified with psychoactive substance-induced sleep disorder: Secondary | ICD-10-CM

## 2019-06-08 MED ORDER — PREGABALIN 75 MG PO CAPS: 75.0000 mg | ORAL_CAPSULE | Freq: Every day | ORAL | 0 refills | Status: DC

## 2019-06-08 NOTE — Progress Notes (Signed)
   THERAPIST PROGRESS NOTE  Session Time: 4:05-5:05  Participation Level: Active  Behavioral Response: CasualAlertAnxious  Type of Therapy: Family Therapy  Treatment Goals addressed: Coping and Diagnosis: Substance use disorder  Interventions: CBT, Motivational Interviewing and Supportive  Summary: Kevin Andrade is a 48 y.o. male who presents with his wife for family therapy in conjunction with CDIOP. Client's wife is allowed time to share has client's addiction has effected her and their relationship. Wife was prompted to express needs. Wife and client described differences in view and behaviors related to what 'strong' looks like, in a person and as a spouse. Client shared concerns about being vulnerable with craving thoughts and 'putting more on her plate' however wife reports this would be more helpful in understanding interactions. Client and wife were provided information shared in group related to co-dependency and differences in supporting.   Suicidal/Homicidal: Nowithout intent/plan  Therapist Response: Clinician met with client and spouse. Clinician provided active listening and validated wife's concerns. Clinician prompted client to share topic of the day in effort to increase communication. Clinician facilitated discussion about needs of both client and spouse and steps to be taken short and long term. Clinician provided spouse with co-dependency information from group and encouraged engagement with al-anon.  Plan: Return again in 1 weeks.  Diagnosis: Axis I: Substance Abuse       Olegario Messier, LCSW 06/08/2019

## 2019-06-08 NOTE — Progress Notes (Signed)
Patient ID: Kevin Andrade, male   DOB: [birth year], 48 y.o.   MRN: [id number] S- Pt seen for FU on medications prescribed 1 week ago for mood and anxiety. He did not tolerate 150 mg Lyrica.Said he felt groggy next day after night time dose.He had no trouble with Lexapro. O- Musculoskeletal: Strength & Muscle Tone: within normal limits Gait & Station: normal Patient leans: N/A  Mental Status Examination   Appearance: Casually dressed Alert: Yes Attention: fair  Cooperative: Yes Eye Contact: Fair Speech: normal in volume, rate, tone, spontaneous Psychomotor Activity: Normal Memory/Concentration: Intact Oriented: Full person, place and situation Mood: Slight dysphoria/anxiety Affect: Congruent and Full Range Thought Processes and Associations: Goal Directed and Intact Fund of Knowledge: Fair Thought Content:NO Suicidal ideation, Homicidal ideation, Auditory hallucinations, Visual hallucinations, Delusions and Paranoia, Insight: Fair  Judgement: Fair  Language: WDL  A- Slight improvement in anxiety and mood-did not tolerate 150 mg Lyrical dosage  P-Rx for 75 mg Lyrica-titrate to 150 mg if possible FU 1 week

## 2019-06-09 NOTE — Progress Notes (Signed)
    Daily Group Progress Note  Program: CD-IOP   Group Time: 1pm-2:30pm  Participation Level: Active  Behavioral Response: Appropriate  Type of Therapy: Process Group  Topic: Clinician checked in with clients, assessing for SI/HI/psychosis and overall level of functioning including cravings, substance use, and engagement in community 12 step meetings. Clinician and group members processed highlights of the weekend and barriers to recovery. Clinician and group members read and discussed Daily Reflection and popsicle stick topics of forgiveness, step one, loneliness, and ______, and how this applies to current phase of recovery.   Group Time: 2:30pm-4pm  Participation Level: Active  Behavioral Response: Appropriate  Type of Therapy: Psycho-education Group  Topic: Clinician provided mindfulness exercise of butterfly tapping and group practiced in session. Clinician presented the psycho-educational topic of challenging cognitive distortions. Clinician and group members reviewed types of cognitive distorions and examples of each. Clinician and group members reviewed ways to challenge unhealthful thoughts and create alternative, realistic thoughts. Clinician reminded clients of CBT triangle and the importance identifying thoughts and feelings and have on behaviors.    Summary: Client sobriety date remains 05/10/2019 with 1 support group meeting attended since last group. Client reports staying 'busy enough' on the weekend and is getting more comfortable with not being overly busy all the time and allowing himself time to relax. Client reports improving communication with his wife after family session from previous week and although still uncomfortable, attempting to continue sharing thoughts and feelings with his wife. Client shows progress toward goals as evidence by attending group and community meetings and participating appropriately, also increasing communication with wife. Client needs  continue work on managing craving thoughts and use of skills without prompting to manage reported symptoms of anxiety. See PA note for additional information related to medication.  Family Program: Family present? No   Name of family member(s): N/A  UDS collected: No Results: 06/03/19 confirm kratom AA/NA attended?: Yes 1 mtg since Thursday after group  Sponsor?: No   Olegario Messier, LCSW

## 2019-06-10 ENCOUNTER — Other Ambulatory Visit (HOSPITAL_COMMUNITY): Payer: BC Managed Care – PPO | Admitting: Licensed Clinical Social Worker

## 2019-06-10 ENCOUNTER — Other Ambulatory Visit: Payer: Self-pay

## 2019-06-10 DIAGNOSIS — F32A Depression, unspecified: Secondary | ICD-10-CM

## 2019-06-10 DIAGNOSIS — F418 Other specified anxiety disorders: Secondary | ICD-10-CM | POA: Diagnosis not present

## 2019-06-10 DIAGNOSIS — Z6372 Alcoholism and drug addiction in family: Secondary | ICD-10-CM

## 2019-06-10 DIAGNOSIS — F1120 Opioid dependence, uncomplicated: Secondary | ICD-10-CM

## 2019-06-10 DIAGNOSIS — F1920 Other psychoactive substance dependence, uncomplicated: Secondary | ICD-10-CM

## 2019-06-10 DIAGNOSIS — F329 Major depressive disorder, single episode, unspecified: Secondary | ICD-10-CM

## 2019-06-11 ENCOUNTER — Other Ambulatory Visit (HOSPITAL_COMMUNITY): Payer: BC Managed Care – PPO | Admitting: Licensed Clinical Social Worker

## 2019-06-11 ENCOUNTER — Other Ambulatory Visit: Payer: Self-pay

## 2019-06-11 DIAGNOSIS — F1120 Opioid dependence, uncomplicated: Secondary | ICD-10-CM

## 2019-06-11 DIAGNOSIS — F1590 Other stimulant use, unspecified, uncomplicated: Secondary | ICD-10-CM

## 2019-06-11 DIAGNOSIS — F1021 Alcohol dependence, in remission: Secondary | ICD-10-CM

## 2019-06-11 DIAGNOSIS — F418 Other specified anxiety disorders: Secondary | ICD-10-CM | POA: Diagnosis not present

## 2019-06-11 DIAGNOSIS — F329 Major depressive disorder, single episode, unspecified: Secondary | ICD-10-CM

## 2019-06-11 DIAGNOSIS — F32A Depression, unspecified: Secondary | ICD-10-CM

## 2019-06-11 NOTE — Progress Notes (Signed)
    Daily Group Progress Note  Program: CD-IOP   Group Time: 1pm-2:30pm  Participation Level: Active  Behavioral Response: Appropriate  Type of Therapy: Process Group  Topic: Clinician met with clients assessing for SI/HI/psychosis and overall level of functioning. Clinician and group members processed recent successes and barriers to recovery process. Clinician and group members processed struggles with changing thought patterns and behaviors. Clinician reminded clients the first step to recovery is always to 'not use' and that behavior will proceed any thought pattern changes. Clinician and group members processed negative self-talk and the effects on self-esteem and behaviors. Clinician utilized 'Loaded Questions' to engage clients in practicing vulnerability without judgement.   Group Time: 2:30pm-4pm  Participation Level: Active  Behavioral Response: Appropriate  Type of Therapy: Psycho-education Group  Topic: Clinician provided psycho-educational session on self-compassion. Clinician and group members participated in [place]. Clinician presented video from Marilynn Latino on self-compassion. Clinician and group members discussed ways to increase self-compassion as a response to self-judgment.    Summary: Client sobriety date remains 05/10/19, with no meetings attended since last session. Client reports increase ability to relax and be okay if a full 'to do' list is not completed. Client also reports increase in motivation which he has not experienced recently. Client has previously identified negative self talk in response to feelings of guilt for behaviors when using and struggle with self forgiveness until he is able to 'make up' for his behaviors, although he is unable to identify what that would look like.    Family Program: Family present? No   Name of family member(s): NA  UDS collected: Yes Results: from previous UDS, decreasing levels of kratom,  prescribed medications  AA/NA attended?: No  Sponsor?: No   Kayliana Codd A Merrianne Mccumbers, LCSW, LCAS

## 2019-06-12 NOTE — Progress Notes (Signed)
    Daily Group Progress Note  Program: CD-IOP   Group Time: 1pm-2:30pm  Participation Level: Active  Behavioral Response: Appropriate  Type of Therapy: Process Group  Topic: Clinician checked in with group members, assessing for SI/HI/psychosis and overall level of functioning including substance use. Clinician and group members checked in with sobriety dates of number of community meetings attended since last session. Clinician and group members discussed 'highs and lows' from previous evening, identifying any identified triggers or skills practiced. Clinician and group read and discussed Daily Reflection with a focus on personal daily inventories, identifying and responding to gossip and identified character defects. Clinician presented the topic of Anger and anger management. Clinician and group members processed where/how we learn anger is acceptable to show, lessons learned growing up about expression of anger. Clinician and group members processed anger as  secondary emotion and the possible emotions which might present as anger and why.   Group Time: 2:30pm-4pm  Participation Level: Active  Behavioral Response: Appropriate  Type of Therapy: Psycho-education Group  Topic: Client presented EFT tapping as an emotional regulation skill and practiced with clients in session. Clinician also re-demonstrated butterfly tapping for emotional regulation. Clinician and group members started recognizing anger triggers in different settings, and how anger is felt in the body at different intensities. Clinician utilized [facility] Understanding Anger: Tips for Managing Anger worksheet. Clinician presented [employer], focused on healthy communication during disagreements.   Summary: Client sobriety date remains 05/10/19. Client is appreciative for more specific numbers to show decreased levels as he reports not using substances since reported sobriety date and was concerned about the effect of  continued positive UDS on relationship with his wife. Client shared feelings were not 'allowed' in his family of origin and what was modeled was 'pushing feelings under the rug' which client reports he has noticed is his pattern as well until sometimes 'exploding.' Client shows progress toward goals AEB being able to identify feelings and related thoughts and based behaviors on intensity and alternate thoughts after a 'pause' between trigger and decision for action. Client reports he is better able to decipher between thoughts of 'needs vs wants' with craving thoughts and physical feelings. Client will need continued progress on identifying and expressing needs and becoming comfortable with not being busy all of the time and avoiding unhelpful thoughts/feelings.   Family Program: Family present? No   Name of family member(s): NA  UDS collected: No Results: previous show decreasing levels of kratom as confirmed by lab raw data numbers.  AA/NA attended?: Yes, 1  Sponsor?: No   Kevin Andrade A Kevin Earll, LCSW, LCAS

## 2019-06-15 ENCOUNTER — Other Ambulatory Visit: Payer: Self-pay

## 2019-06-15 ENCOUNTER — Other Ambulatory Visit (HOSPITAL_COMMUNITY): Payer: BC Managed Care – PPO | Admitting: Licensed Clinical Social Worker

## 2019-06-15 ENCOUNTER — Encounter (HOSPITAL_COMMUNITY): Payer: Self-pay | Admitting: Medical

## 2019-06-15 DIAGNOSIS — F411 Generalized anxiety disorder: Secondary | ICD-10-CM

## 2019-06-15 DIAGNOSIS — F17210 Nicotine dependence, cigarettes, uncomplicated: Secondary | ICD-10-CM

## 2019-06-15 DIAGNOSIS — F19982 Other psychoactive substance use, unspecified with psychoactive substance-induced sleep disorder: Secondary | ICD-10-CM

## 2019-06-15 DIAGNOSIS — F329 Major depressive disorder, single episode, unspecified: Secondary | ICD-10-CM

## 2019-06-15 DIAGNOSIS — Z6372 Alcoholism and drug addiction in family: Secondary | ICD-10-CM

## 2019-06-15 DIAGNOSIS — F1021 Alcohol dependence, in remission: Secondary | ICD-10-CM

## 2019-06-15 DIAGNOSIS — F4312 Post-traumatic stress disorder, chronic: Secondary | ICD-10-CM

## 2019-06-15 DIAGNOSIS — T7492XS Unspecified child maltreatment, confirmed, sequela: Secondary | ICD-10-CM

## 2019-06-15 DIAGNOSIS — F1994 Other psychoactive substance use, unspecified with psychoactive substance-induced mood disorder: Secondary | ICD-10-CM

## 2019-06-15 DIAGNOSIS — F32A Depression, unspecified: Secondary | ICD-10-CM

## 2019-06-15 DIAGNOSIS — F1590 Other stimulant use, unspecified, uncomplicated: Secondary | ICD-10-CM

## 2019-06-15 DIAGNOSIS — F1120 Opioid dependence, uncomplicated: Secondary | ICD-10-CM

## 2019-06-15 DIAGNOSIS — F418 Other specified anxiety disorders: Secondary | ICD-10-CM | POA: Diagnosis not present

## 2019-06-15 NOTE — Progress Notes (Signed)
Patient ID: Kevin Andrade, male   DOB: [birth year], 48 y.o.   MRN: [id number] Contacted pt outside group to see how he responded to 75 mg Lyrica-He had a smiling postive response .75 mg is working.Will discontinue 150 mg

## 2019-06-16 ENCOUNTER — Ambulatory Visit (HOSPITAL_COMMUNITY): Payer: BC Managed Care – PPO | Admitting: Licensed Clinical Social Worker

## 2019-06-16 DIAGNOSIS — F1021 Alcohol dependence, in remission: Secondary | ICD-10-CM

## 2019-06-16 DIAGNOSIS — F1590 Other stimulant use, unspecified, uncomplicated: Secondary | ICD-10-CM

## 2019-06-16 DIAGNOSIS — F1994 Other psychoactive substance use, unspecified with psychoactive substance-induced mood disorder: Secondary | ICD-10-CM

## 2019-06-16 DIAGNOSIS — F1120 Opioid dependence, uncomplicated: Secondary | ICD-10-CM

## 2019-06-16 NOTE — Progress Notes (Signed)
   THERAPIST PROGRESS NOTE Session Time: 2pm-2:35pm  Participation Level: Active  Behavioral Response: CasualAlertEuthymic  Type of Therapy: Individual Therapy  Treatment Goals addressed: Anxiety, Coping and Diagnosis: Achieve and maintain sobriety 7/7 days per week. Increase use of healthy coping skills to maintain anxiety at least 2 times per day at least 5 days per week.  Interventions: CBT, Motivational Interviewing and Supportive  Summary: Kevin Andrade is a 48 y.o. male who presents with substance use disorder and symptoms of anxiety. Client shares coping with craving thoughts prior to attending session this day by changing his surroundings and thought stopping. Client is able to identify related thoughts and feelings and walk through changes in perspective and decision making. Client verbalizes concerns about returning to work and ways he plans to problem solve. Client continues to built support community separate of group and improve relationship with wife. Client states his wife was proud of him for calling when feeling the urge to use and client reports, though uncomfortable, it was helpful.   Suicidal/Homicidal: Nowithout intent/plan  Therapist Response: Clinician met with client face to face, assessing for SI/HI/psychosis and overall level of functioning. Clinician processes with client upcoming changes and plans to address changes in schedule, stressors, and support. Clinician encourages client to work on creating relapse prevention plan for known stressors that can be added to as he returns to work. Clinician praises client use of learned coping skills.  Plan: Return again in 1 weeks.  Diagnosis: Axis I: Anxiety Disorder NOS and Substance Abuse     Olegario Messier, LCSW 06/16/2019

## 2019-06-17 ENCOUNTER — Other Ambulatory Visit: Payer: Self-pay

## 2019-06-17 ENCOUNTER — Other Ambulatory Visit (HOSPITAL_COMMUNITY): Payer: BC Managed Care – PPO | Admitting: Licensed Clinical Social Worker

## 2019-06-17 DIAGNOSIS — F418 Other specified anxiety disorders: Secondary | ICD-10-CM | POA: Diagnosis not present

## 2019-06-17 DIAGNOSIS — F1021 Alcohol dependence, in remission: Secondary | ICD-10-CM

## 2019-06-17 DIAGNOSIS — F411 Generalized anxiety disorder: Secondary | ICD-10-CM

## 2019-06-17 DIAGNOSIS — F1590 Other stimulant use, unspecified, uncomplicated: Secondary | ICD-10-CM

## 2019-06-17 DIAGNOSIS — F1994 Other psychoactive substance use, unspecified with psychoactive substance-induced mood disorder: Secondary | ICD-10-CM

## 2019-06-17 DIAGNOSIS — F1120 Opioid dependence, uncomplicated: Secondary | ICD-10-CM

## 2019-06-18 ENCOUNTER — Other Ambulatory Visit: Payer: Self-pay

## 2019-06-18 ENCOUNTER — Other Ambulatory Visit (HOSPITAL_COMMUNITY): Payer: BC Managed Care – PPO | Admitting: Licensed Clinical Social Worker

## 2019-06-18 DIAGNOSIS — F418 Other specified anxiety disorders: Secondary | ICD-10-CM | POA: Diagnosis not present

## 2019-06-18 DIAGNOSIS — F329 Major depressive disorder, single episode, unspecified: Secondary | ICD-10-CM

## 2019-06-18 DIAGNOSIS — F1994 Other psychoactive substance use, unspecified with psychoactive substance-induced mood disorder: Secondary | ICD-10-CM

## 2019-06-18 DIAGNOSIS — F411 Generalized anxiety disorder: Secondary | ICD-10-CM

## 2019-06-18 DIAGNOSIS — F1021 Alcohol dependence, in remission: Secondary | ICD-10-CM

## 2019-06-18 DIAGNOSIS — F1920 Other psychoactive substance dependence, uncomplicated: Secondary | ICD-10-CM

## 2019-06-18 DIAGNOSIS — F1590 Other stimulant use, unspecified, uncomplicated: Secondary | ICD-10-CM

## 2019-06-18 DIAGNOSIS — F1120 Opioid dependence, uncomplicated: Secondary | ICD-10-CM

## 2019-06-18 DIAGNOSIS — F32A Depression, unspecified: Secondary | ICD-10-CM

## 2019-06-22 ENCOUNTER — Other Ambulatory Visit (HOSPITAL_COMMUNITY): Payer: BC Managed Care – PPO | Admitting: Licensed Clinical Social Worker

## 2019-06-22 ENCOUNTER — Other Ambulatory Visit: Payer: Self-pay

## 2019-06-22 DIAGNOSIS — F1120 Opioid dependence, uncomplicated: Secondary | ICD-10-CM

## 2019-06-22 DIAGNOSIS — F1994 Other psychoactive substance use, unspecified with psychoactive substance-induced mood disorder: Secondary | ICD-10-CM

## 2019-06-22 DIAGNOSIS — F411 Generalized anxiety disorder: Secondary | ICD-10-CM

## 2019-06-22 DIAGNOSIS — F418 Other specified anxiety disorders: Secondary | ICD-10-CM | POA: Diagnosis not present

## 2019-06-23 NOTE — Progress Notes (Signed)
    Daily Group Progress Note  Program: CD-IOP   Group Time: 1pm-2:30pm  Participation Level: Active  Behavioral Response: Appropriate  Type of Therapy: Process Group  Topic:Clinician checked in with clients, assessing for SI/HI/psychosis and overall level of functioning including substance use and difficulty maintaining sobriety. Clinician inquired about community meetings attended and skills attempted since last session. Clinician and group members processed successes and challenges they experienced over the weekend. Clinician and group members read and discussed Daily Reflection from AA and NA quote of the day and discussed relevance in current stage of recovery.   Group Time: 2:30-4pm  Participation Level: Active  Behavioral Response: Appropriate  Type of Therapy: Psycho-education Group  Topic: Clinician provided clients with progressive muscle relaxation activity in session for mindfulness practice. Clinician presented the topic of Distress Tolerance Crisis Skills. Clinician and group reviewed STOP and TIPP skills. Clinician and group reviewed Distracting skill of ACCEPTS. Clinician and group members practiced 5 senses still in session.   Summary:  Client engages in group discussions and activities appropriately with congruent mood and affect. Client participates in relaxation technique reporting doing something similar already. Client identifies overall improvement and ability to manage overwhelming symptoms as long as he stays busy and his mind occupied. Client shows progress toward goal AEB maintaining sobriety from all substances since 05/10/19 and engaging in community support meetings as well as practicing skills outside of group sessions.   Family Program: Family present? No   Name of family member(s): NA  UDS collected: Yes Results: pending  AA/NA attended?: Yes   Sponsor?: No   Olegario Messier, LCSW

## 2019-06-23 NOTE — Progress Notes (Signed)
    Daily Group Progress Note  Program: CD-IOP   Group Time: 1pm-2:30pm  Participation Level: Active  Behavioral Response: Appropriate  Type of Therapy: Process Group  Topic: Clinician checked in with group members, assessing for SI/HI/psychosis and overall level of functioning. Clinician and group members processed events of the weekend, including highlights and moments of struggle. Clinician and group members processed related thoughts and feelings and coping skills used to address. Clinician and group members read and processed Daily Reflection and relation to current stage of recovery.   Group Time: 2:30pm-4pm  Participation Level: Active  Behavioral Response: Appropriate  Type of Therapy: Psycho-education Group  Topic: Clinician provided guided relaxation skill in session. Clinician presented psycho-educational information on 'Addictive Personalities' using supporting material from The Substance Abuse and Recovery Workbook. Clinician and group members discussed personality traits relating to self-esteem, relationship dynamics, life skills,, and excitement. Clinician and group members discussed changes in risk taking in recovery vs active addiction. Clinician inquired a self-care activity planned before next group.    Summary: [place] 05/10/19 remains sobriety date from all substances. Client provided feedback to group members on ambivalence of treatment and the benefits of completing treatment with suport of a group. Client shared skills practiced frequently and endorsed meditation becoming easier with practice as well as breathing skills becoming more common use. Client reports he still has to be mindful of not allowing himself to get bored around the house as this is a big trigger for craving thoughts. Client is able to identify changes in view of personality, including what he notes as 'exciting' and improvements in relationship dynamics with wife with improving  communication.    Family Program: Family present? No   Name of family member(s): N/A. Wife asks weekly about UDS results and group topics.  UDS collected: Yes Results: prescribed medications only.  AA/NA attended?: Yes; 1 mtg  Sponsor?: No   Olegario Messier, LCSW

## 2019-06-23 NOTE — Progress Notes (Signed)
    Daily Group Progress Note  Program: CD-IOP   Group Time: 1pm-2:30pm  Participation Level: Active  Behavioral Response: Appropriate  Type of Therapy: Process Group  Topic: Clinician checked in with group members, assessing for SI/HI/psychosis and overall level of functioning including successes and barriers to recovery. Clinician and group members read and processed Daily reflections and application to current place in recovery. Clinician and group member processed finding home and self-respect through gratitude and recovery. Clinician and group members shared personal quotes as homework from previous days discussions.   Group Time: 2:30pm-4pm  Participation Level: Active  Behavioral Response: Appropriate  Type of Therapy: Psycho-education Group  Topic: Clinician provided psycho-education on the benefits of mindfulness in recovery. Clinician and group members discussed changing thought patterns from suffering, to creating home and changes in perspective. Clinician utilized Happy Brain: Predisposition to Suffering as supplemental material. Clinician and group members utilized SMART goal creation to focus on building new habits and strengthening 'recovery thinking' pathways.   Summary: Client shared quote from home he plans on having framed. Client identifies how sobriety has effected his respect for self and others. Client participates in gratitude exercise. Client is receptive to material. Client created goal focused on maintaining sobriety which includes accountability to his wife with improving communication and sharing of thoughts and feelings as well as continued engagement in community support meetings. Client sobriety date remains 05/10/19.   Family Program: Family present? No   Name of family member(s): NA  UDS collected: No Results: NA  AA/NA attended?: 1  Sponsor?: No   Jailynne Opperman A Deloy Archey, LCSW, LCAS

## 2019-06-24 ENCOUNTER — Other Ambulatory Visit (HOSPITAL_COMMUNITY): Payer: BC Managed Care – PPO | Admitting: Licensed Clinical Social Worker

## 2019-06-24 ENCOUNTER — Other Ambulatory Visit: Payer: Self-pay

## 2019-06-24 DIAGNOSIS — F32A Depression, unspecified: Secondary | ICD-10-CM

## 2019-06-24 DIAGNOSIS — F329 Major depressive disorder, single episode, unspecified: Secondary | ICD-10-CM

## 2019-06-24 DIAGNOSIS — F1120 Opioid dependence, uncomplicated: Secondary | ICD-10-CM

## 2019-06-24 DIAGNOSIS — F418 Other specified anxiety disorders: Secondary | ICD-10-CM | POA: Diagnosis not present

## 2019-06-24 DIAGNOSIS — F19982 Other psychoactive substance use, unspecified with psychoactive substance-induced sleep disorder: Secondary | ICD-10-CM

## 2019-06-24 DIAGNOSIS — F411 Generalized anxiety disorder: Secondary | ICD-10-CM

## 2019-06-25 ENCOUNTER — Ambulatory Visit (HOSPITAL_COMMUNITY): Payer: BC Managed Care – PPO | Admitting: Licensed Clinical Social Worker

## 2019-06-25 ENCOUNTER — Other Ambulatory Visit: Payer: Self-pay

## 2019-06-25 ENCOUNTER — Other Ambulatory Visit (HOSPITAL_COMMUNITY): Payer: BC Managed Care – PPO

## 2019-06-25 NOTE — Progress Notes (Signed)
    Daily Group Progress Note  Program: CD-IOP   Group Time: 1pm-2:30pm  Participation Level: Active  Behavioral Response: Appropriate  Type of Therapy: Process Group  Topic: Clinician met with group members, assessing for SI/HI/psychosis and overall level of functioning. Clinician and group members processed successes and barriers to recovery since last group. Clinician and group members read daily reflection and processed how topic of gossip and taking daily moral inventory of personal character defects applies to current point in recovery. Clinician provided mindfulness activity.   Group Time: 2:30pm-4pm  Participation Level: Active  Behavioral Response: Appropriate  Type of Therapy: Psycho-education Group  Topic: Clinician presented psycho-education related to dopamine changes in early recovery in relation to levels of motivation and experiencing happiness. Clinician presented the psycho-educational topic of relapse prevention. Clinician presented The Developmental Model of Recovery from [place] and discussed progression of recovery and relapse process. Clinician presented common high-risk situations for mental health and substance abuse relapse. Group members worked to identify internal and external triggers for cravings, anxiety, and depressive symptoms. Clinician and group members identified early warning signs to relapse as well as daily maintenance activities to manage symptoms. Clinician provided coping strategies to coping with cravings and urges and 'Tips for Avoiding Relapse.'   Summary: Client sobriety date remains 05/10/19. Client engaged appropriately in discussion of being mindful of personal thoughts and behaviors before judgement of others. Client reported finding psycho-education on dopamine levels in early recovery helpful in understanding changes in motivation level and providing hope for improving motivation and levels of enjoyment. Client acknowledges thoughts  relating to new levels of enjoyment will feel different from levels in active addiction and the need to focus on healthy levels of engagement. Client shares 'maintenance' activities he is putting I place to plan for difficult days. Client shows progress toward goals AEB maintaining sobriety, building routine, and implementing skills to address uncomfortable emotions.   Family Program: Family present? No   Name of family member(s): NA  UDS collected: No Results: none  AA/NA attended?: Yes. 1 mtg since last group  Sponsor?: No   Olegario Messier, LCSW

## 2019-06-29 ENCOUNTER — Encounter (HOSPITAL_COMMUNITY): Payer: Self-pay | Admitting: Medical

## 2019-06-29 ENCOUNTER — Other Ambulatory Visit: Payer: Self-pay

## 2019-06-29 ENCOUNTER — Other Ambulatory Visit (HOSPITAL_COMMUNITY): Payer: Self-pay | Admitting: Medical

## 2019-06-29 ENCOUNTER — Other Ambulatory Visit (HOSPITAL_COMMUNITY): Payer: BC Managed Care – PPO | Attending: Psychiatry | Admitting: Licensed Clinical Social Worker

## 2019-06-29 DIAGNOSIS — Z62819 Personal history of unspecified abuse in childhood: Secondary | ICD-10-CM | POA: Diagnosis not present

## 2019-06-29 DIAGNOSIS — F1590 Other stimulant use, unspecified, uncomplicated: Secondary | ICD-10-CM

## 2019-06-29 DIAGNOSIS — F1994 Other psychoactive substance use, unspecified with psychoactive substance-induced mood disorder: Secondary | ICD-10-CM | POA: Diagnosis not present

## 2019-06-29 DIAGNOSIS — Z79899 Other long term (current) drug therapy: Secondary | ICD-10-CM | POA: Diagnosis not present

## 2019-06-29 DIAGNOSIS — Z6372 Alcoholism and drug addiction in family: Secondary | ICD-10-CM

## 2019-06-29 DIAGNOSIS — F419 Anxiety disorder, unspecified: Secondary | ICD-10-CM | POA: Diagnosis not present

## 2019-06-29 DIAGNOSIS — F1021 Alcohol dependence, in remission: Secondary | ICD-10-CM | POA: Diagnosis not present

## 2019-06-29 DIAGNOSIS — F19982 Other psychoactive substance use, unspecified with psychoactive substance-induced sleep disorder: Secondary | ICD-10-CM

## 2019-06-29 DIAGNOSIS — F4312 Post-traumatic stress disorder, chronic: Secondary | ICD-10-CM

## 2019-06-29 DIAGNOSIS — Z811 Family history of alcohol abuse and dependence: Secondary | ICD-10-CM

## 2019-06-29 DIAGNOSIS — F1911 Other psychoactive substance abuse, in remission: Secondary | ICD-10-CM

## 2019-06-29 DIAGNOSIS — Z7982 Long term (current) use of aspirin: Secondary | ICD-10-CM | POA: Diagnosis not present

## 2019-06-29 DIAGNOSIS — F329 Major depressive disorder, single episode, unspecified: Secondary | ICD-10-CM | POA: Insufficient documentation

## 2019-06-29 DIAGNOSIS — F32A Depression, unspecified: Secondary | ICD-10-CM

## 2019-06-29 DIAGNOSIS — F1121 Opioid dependence, in remission: Secondary | ICD-10-CM | POA: Diagnosis not present

## 2019-06-29 DIAGNOSIS — T7492XS Unspecified child maltreatment, confirmed, sequela: Secondary | ICD-10-CM

## 2019-06-29 MED ORDER — ESCITALOPRAM OXALATE 20 MG PO TABS: 20.0000 mg | ORAL_TABLET | Freq: Every day | ORAL | 1 refills | Status: DC
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
## 2019-06-30 NOTE — Progress Notes (Signed)
    Daily Group Progress Note  Program: CD-IOP   Group Time: 1pm-2:30pm Participation Level: Active Behavioral Response: Appropriate Type of Therapy: Process Group  Topic: Clinician checked in with group members, assessing for SI/HI/psychosis and overall level of functioning including difficulty maintaining sobriety and engagement in [employer]. Clinician and group members read and processed Daily Reflections from [place] and NA. Clinician presented clients with 'My Mission: Celebrating Life and Love' and processed creating 'new' 'sober' sense of self.   Group Time: 2:30pm-4pm Participation Level: Active Behavioral Response: Appropriate Type of Therapy: Psycho-education Group  Topic: Clinician presented psycho-education on Willingness vs Willfulness. Clinician and group members discussed points of willing and willfulness in current life, or points that have changed since becoming sober. Clinician provided supplemental video with activity practicing willing vs willfulness with a breathing activity. Clinician presented the DBT skills focused on willingness and acceptance with Half-Smile and Willing Hands.   Summary: Client shared differences in thoughts about self and life after more than 1 month of sobriety. Client is able to identify current and previous stuck points including previously identifying that continued use was unhealthy and continuing to use despite this. Client engages in willingness activities and shows progress toward goals AEB maintaining sobriety and planning skills able to use when returning to work.   Family Program: Family present? No   Name of family member(s): NA  UDS collected: No Results: no substances found from previous UDS  AA/NA attended?: Yes  Sponsor?: No   Olegario Messier, LCSW

## 2019-06-30 NOTE — Progress Notes (Signed)
    Daily Group Progress Note  Program: CD-IOP   Group Time: 1pm-2:30pm  Participation Level: Active  Behavioral Response: Appropriate  Type of Therapy: Process Group  Topic:  Clinician checked in with group members, assessing for SI/HI/psychosis and overall level of functioning including strengths and barriers to recovery. Clinician and group members read Daily Reflection and discussed how it applies to current moment in recovery. Clinician provided mindfulness grounding activity for clients to participate.   Group Time: 2:30-4pm  Participation Level: Active  Behavioral Response: Appropriate  Type of Therapy: psycho-educational group  Topic:  Clinician presented the topic of Co-dependency. Clinician and group members traits and how this applies to current space in recovery and how previously supported addition. Clinician challenged group members to identify where there are not healthy boundaries and how this is effecting co-dependent behaviors. Clinician and group members reviewed healthy boundaries and thinking and role played addressing behaviors with family members.   Summary: 05/10/19 sobriety date. Client shares current co-dependent behaviors identified in both self and wife and how developed and changed back on substance use. Client identified what 'being strong' and 'being the man or provider for the home' looked like to him and how this effects what/how she shares with his wife. Client discussed what he saw growing up and how being 'kicked out' effected his behaviors of being self sufficient. Client is open to continued information related to co-dependent behaviors which need to be addressed in his current family in order to create and maintain healthy boundaries with family members.  Family Program: Family present? No   Name of family member(s): NA  UDS collected: No   AA/NA attended?: Yes  Sponsor?: No   Olegario Messier, LCSW

## 2019-06-30 NOTE — Progress Notes (Signed)
    Daily Group Progress Note  Program: CD-IOP   Group Time: 1pm-2:30pm Participation Level: Active Behavioral Response: Appropriate  Type of Therapy: Process Group  Topic: Clinician met with clients, assessing for SI/HI/psychosis and substance use. Clinician inquired about recent stressors or barriers to sobriety. Clinician and group members read and discussed Daily Reflection and how it applies to current place in recovery. Clinician and group members processed identifying pros and cons of sobriety and addiction.   Group Time: 2:30pm-4pm Participation Level: Active Behavioral Response: Appropriate  Type of Therapy: Psycho-education Group  Topic: Clinician provided clients with mindfulness activity to help with emotional regulation. Clinician and group members worked on identifying primary and secondary feelings and the benefit of identifying feelings for use in I-statements to get needs met. Clinician and group members utilized Totika activity to focus on identifying feelings related to self-esteem and practice answering questions with lack of self-judgement.   Summary: Sobriety date remains 05/10/19. Client states if having an automatic thought of 'good times' with substances his response is reminding himself of the negative consequences of use. Client reports continuing to work on recognizing emotions in the moment and sharing them with his wife. Client states having a goal of decreasing self-judgement and negative self talk long term, but is aware part of this includes maintaining sobriety to address how he has damaged relationships with others and the need to take time and re-build trust.   Family Program: Family present? No   Name of family member(s): None. DS collected: No Results: positive for kratom. Client reports no use.  AA/NA attended?: No  Sponsor?: No   Olegario Messier, LCSW

## 2019-07-01 ENCOUNTER — Encounter (HOSPITAL_COMMUNITY): Payer: BC Managed Care – PPO

## 2019-07-02 ENCOUNTER — Encounter (HOSPITAL_COMMUNITY): Payer: BC Managed Care – PPO

## 2019-07-06 ENCOUNTER — Other Ambulatory Visit: Payer: Self-pay

## 2019-07-06 ENCOUNTER — Encounter (HOSPITAL_COMMUNITY): Payer: Self-pay | Admitting: Licensed Clinical Social Worker

## 2019-07-06 ENCOUNTER — Ambulatory Visit (INDEPENDENT_AMBULATORY_CARE_PROVIDER_SITE_OTHER): Payer: BC Managed Care – PPO | Admitting: Licensed Clinical Social Worker

## 2019-07-06 DIAGNOSIS — F1911 Other psychoactive substance abuse, in remission: Secondary | ICD-10-CM | POA: Diagnosis not present

## 2019-07-06 DIAGNOSIS — F413 Other mixed anxiety disorders: Secondary | ICD-10-CM | POA: Diagnosis not present

## 2019-07-06 DIAGNOSIS — F1021 Alcohol dependence, in remission: Secondary | ICD-10-CM | POA: Diagnosis not present

## 2019-07-06 DIAGNOSIS — F1121 Opioid dependence, in remission: Secondary | ICD-10-CM | POA: Diagnosis not present

## 2019-07-06 NOTE — Progress Notes (Signed)
Virtual Visit via Video Note  I connected with Kevin Andrade on 07/06/19 at 10:00 AM EST by a video enabled telemedicine application and verified that I am speaking with the correct person using two identifiers.   I discussed the limitations of evaluation and management by telemedicine and the availability of in person appointments. The patient expressed understanding and agreed to proceed.  History of Present Illness: Patient is referred for individual therapy after completion of CD-IOP for Opiod Use Disorder and hx of polysubstance abuse disorder, anxiety, depression. (Kratom & opiate pills)   Observations/Objective: Patient presents for his initial individual therapy appointment. Patient referred for therapy after completion of CD-IOP last week. Patient was addicted to Kratom and opiate pills. He has been clean since 9/14.  Patient lives with his wife (she's a nurse), who's relationship has been difficult due to his drug use. He reports their relationship is much better now. Patient has begun sharing his feelings with her, talking more to her. He has 1 son who is married, and has 2 grandkids. Their relationship is rocky due to his daughter-in-laws contentious relationship. This is difficult for patient as he wonders if it's because of his drug use. Patient works for [employer] (12-9pm) and has been out of work for 2 months, goes back tomorrow. Discussed how to re-enter his work environment. Discussed NA meetings, trying to find in-pperson meetings, looking for a sponsor. Will send patient step 1 workbook and informational sheets. Identified goals for tx and pt verbalized acceptance of plan.   Assessment and Plan: Counselor will continue to meet with patient to address treatment plan goals. Patient will continue to follow recommendations of providers and implement skills learned in session.     Follow Up Instructions:  Counselor will send webex request for weekly sessions.  I discussed the  assessment and treatment plan with the patient. The patient was provided an opportunity to ask questions and all were answered. The patient agreed with the plan and demonstrated an understanding of the instructions.   The patient was advised to call back or seek an in-person evaluation if the symptoms worsen or if the condition fails to improve as anticipated.  I provided 60 minutes of non-face-to-face time during this encounter.   , S, LCAS

## 2019-07-07 NOTE — Progress Notes (Signed)
    Daily Group Progress Note  Program: CD-IOP   Group Time: 1pm-2:30pm  Participation Level: Active  Behavioral Response: Appropriate  Type of Therapy: Psycho-education Group  Topic: Clinician met with group members, assessing for SI/HI/psychosis and overall level of functioning including engagement in community support programs and difficulty maintaining sobriety. Clinician and group members processed strengths and struggles over the weekend. Clinician and group members read and processed AA and NA daily reflections.  Psycho-educational group facilitated by [facility] focused on mindfulness by utilizing gentle yoga and breathing meditations.    Group Time: 2:30pm-4pm  Participation Level: Active  Behavioral Response: Appropriate  Type of Therapy: Process Group  Topic:  Clinician and group members processed changes in self and connection with others based on stage in recovery or relapse. Clinician and group members created personal obituaries and processed how these could be different based on continued use or sobriety. Clinician and group members celebrated graduation of group members reviewing progress and well wishes.   Summary: Client completes CDIOP successfully on this day. See PA notes for additional discharge planning including outpatient therapy and medication management. Client sobriety date remains 05/10/19 from all substances. Client engaged in group discussions and activities, sharing how relationship with family and decision making has changed over the past 2 months. Client shares plans for supporting recovery when returning to work.   Family Program: Family present? No   Name of family member(s): NA  UDS collected: No Results: negative for all substances. Positive for all prescribed medications  AA/NA attended?: Yes  Sponsor?: No   Olegario Messier, LCSW

## 2019-07-16 ENCOUNTER — Ambulatory Visit (HOSPITAL_COMMUNITY): Payer: BC Managed Care – PPO | Admitting: Medical

## 2019-07-27 ENCOUNTER — Encounter (HOSPITAL_COMMUNITY): Payer: Self-pay | Admitting: Licensed Clinical Social Worker

## 2019-07-27 ENCOUNTER — Ambulatory Visit (INDEPENDENT_AMBULATORY_CARE_PROVIDER_SITE_OTHER): Payer: BC Managed Care – PPO | Admitting: Licensed Clinical Social Worker

## 2019-07-27 ENCOUNTER — Other Ambulatory Visit: Payer: Self-pay

## 2019-07-27 DIAGNOSIS — F1911 Other psychoactive substance abuse, in remission: Secondary | ICD-10-CM

## 2019-07-27 DIAGNOSIS — F1121 Opioid dependence, in remission: Secondary | ICD-10-CM | POA: Diagnosis not present

## 2019-07-27 DIAGNOSIS — F1021 Alcohol dependence, in remission: Secondary | ICD-10-CM

## 2019-07-27 NOTE — Progress Notes (Signed)
Virtual Visit via Video Note  I connected with Kevin Andrade on 07/27/19 at 10:00 AM EST by a video enabled telemedicine application and verified that I am speaking with the correct person using two identifiers.   I discussed the limitations of evaluation and management by telemedicine and the availability of in person appointments. The patient expressed understanding and agreed to proceed.  History of Present Illness: Patient is referred for individual therapy after completion of CD-IOP for Opiod Use Disorder and hx of polysubstance abuse disorder, anxiety, depression. (Kratom & opiate pills)   Observations/Objective: Patient presents for his individual therapy appointment via Webex. This is his 2nd individual appointment. Patient discussed his psychiatric symptoms and current life events. Patient was upbeat, good attitude and grateful for a sober life. He reports his meeting attendance, discussed search for a sponsor while virtual, step 1, clean date remains the same. 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 Patient discussed his continued rocky relationship with his son and it's affects on himself and his wife. Patient discussed his improvement of the relationship with his wife. Encouraged patient to buy the NA book and begin reading it. Will find NA step 1 worksheet to send to him Made new weekly appointments.      Assessment and Plan: Counselor will continue to meet with patient to address treatment plan goals. Patient will continue to follow recommendations of providers and implement skills learned in session.     Follow Up Instructions:  Counselor will send webex request for weekly sessions.  I discussed the assessment and treatment plan with the patient. The patient was provided an opportunity to ask questions and all were answered. The patient agreed with the plan and demonstrated an understanding of the instructions.   The patient  was advised to call back or seek an in-person evaluation if the symptoms worsen or if the condition fails to improve as anticipated.  I provided 45 minutes of non-face-to-face time during this encounter.   Nadiya Pieratt S, LCAS

## 2019-07-30 ENCOUNTER — Ambulatory Visit (INDEPENDENT_AMBULATORY_CARE_PROVIDER_SITE_OTHER): Payer: BC Managed Care – PPO | Admitting: Medical

## 2019-07-30 ENCOUNTER — Encounter (HOSPITAL_COMMUNITY): Payer: Self-pay | Admitting: Medical

## 2019-07-30 DIAGNOSIS — F1994 Other psychoactive substance use, unspecified with psychoactive substance-induced mood disorder: Secondary | ICD-10-CM

## 2019-07-30 DIAGNOSIS — Z6372 Alcoholism and drug addiction in family: Secondary | ICD-10-CM

## 2019-07-30 DIAGNOSIS — F329 Major depressive disorder, single episode, unspecified: Secondary | ICD-10-CM

## 2019-07-30 DIAGNOSIS — F419 Anxiety disorder, unspecified: Secondary | ICD-10-CM

## 2019-07-30 DIAGNOSIS — F17210 Nicotine dependence, cigarettes, uncomplicated: Secondary | ICD-10-CM

## 2019-07-30 DIAGNOSIS — F19982 Other psychoactive substance use, unspecified with psychoactive substance-induced sleep disorder: Secondary | ICD-10-CM

## 2019-07-30 DIAGNOSIS — F4312 Post-traumatic stress disorder, chronic: Secondary | ICD-10-CM

## 2019-07-30 DIAGNOSIS — F1121 Opioid dependence, in remission: Secondary | ICD-10-CM

## 2019-07-30 DIAGNOSIS — F1590 Other stimulant use, unspecified, uncomplicated: Secondary | ICD-10-CM | POA: Diagnosis not present

## 2019-07-30 DIAGNOSIS — T7492XS Unspecified child maltreatment, confirmed, sequela: Secondary | ICD-10-CM

## 2019-07-30 DIAGNOSIS — F1021 Alcohol dependence, in remission: Secondary | ICD-10-CM

## 2019-07-30 DIAGNOSIS — F32A Depression, unspecified: Secondary | ICD-10-CM

## 2019-07-30 DIAGNOSIS — Z811 Family history of alcohol abuse and dependence: Secondary | ICD-10-CM

## 2019-07-30 MED ORDER — TRAZODONE HCL 50 MG PO TABS: 50.0000 mg | ORAL_TABLET | Freq: Every evening | ORAL | 1 refills | Status: DC | PRN

## 2019-07-30 MED ORDER — PREGABALIN 75 MG PO CAPS: 75.0000 mg | ORAL_CAPSULE | Freq: Every day | ORAL | 0 refills | Status: DC

## 2019-07-30 MED ORDER — ESCITALOPRAM OXALATE 20 MG PO TABS: 20.0000 mg | ORAL_TABLET | Freq: Every day | ORAL | 1 refills | Status: DC

## 2019-07-30 MED ORDER — NALTREXONE HCL 50 MG PO TABS: 50.0000 mg | ORAL_TABLET | Freq: Every day | ORAL | 3 refills | Status: DC
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
## 2019-08-04 ENCOUNTER — Ambulatory Visit (INDEPENDENT_AMBULATORY_CARE_PROVIDER_SITE_OTHER): Payer: BC Managed Care – PPO | Admitting: Licensed Clinical Social Worker

## 2019-08-04 ENCOUNTER — Encounter (HOSPITAL_COMMUNITY): Payer: Self-pay | Admitting: Licensed Clinical Social Worker

## 2019-08-04 ENCOUNTER — Other Ambulatory Visit: Payer: Self-pay

## 2019-08-04 DIAGNOSIS — F1121 Opioid dependence, in remission: Secondary | ICD-10-CM

## 2019-08-04 DIAGNOSIS — F1021 Alcohol dependence, in remission: Secondary | ICD-10-CM

## 2019-08-04 NOTE — Progress Notes (Signed)
Virtual Visit via Video Note  I connected with Kevin Andrade on 08/04/19 at  9:00 AM EST by a video enabled telemedicine application and verified that I am speaking with the correct person using two identifiers.   I discussed the limitations of evaluation and management by telemedicine and the availability of in person appointments. The patient expressed understanding and agreed to proceed.  History of Present Illness: Patient is referred for individual therapy after completion of CD-IOP for Opiod Use Disorder and hx of polysubstance abuse disorder, anxiety, depression. (Kratom & opiate pills)   Observations/Objective: Patient presents for his individual therapy appointment via Webex. Patient discussed his psychiatric symptoms and current life events. Patient was upbeat, good attitude and grateful for a sober life. He reports his meeting attendance, discussed search for a sponsor while virtual, step 1, clean date remains the same (05/10/19). Patient reports he's had some "ups and downs" at work within the last week. One trigger to use in the past was being tired at work, midafternoon. Coached patient on nutrition, getting enough protein to help with the midafternoon slump. Patient discussed his NA meeting attendance. Educated patient on various recovery meeting terms. Continued to suggest patient to find a sponsor. Patient saw Deedra Ehrich, PA, for medication management. raged patient to buy the NA book Thursday.To emaill patient all  Step worksheets.  Assessment and Plan: Counselor will continue to meet with patient to address treatment plan goals. Patient will continue to follow recommendations of providers and implement skills learned in session.     Follow Up Instructions:  Counselor will send webex request for weekly sessions.  I discussed the assessment and treatment plan with the patient. The patient was provided an opportunity to ask questions and all were answered. The patient agreed  with the plan and demonstrated an understanding of the instructions.   The patient was advised to call back or seek an in-person evaluation if the symptoms worsen or if the condition fails to improve as anticipated.  I provided 60 minutes of non-face-to-face time during this encounter.   Jasdeep Kepner S, LCAS

## 2019-08-11 ENCOUNTER — Encounter (HOSPITAL_COMMUNITY): Payer: Self-pay | Admitting: Licensed Clinical Social Worker

## 2019-08-11 ENCOUNTER — Other Ambulatory Visit: Payer: Self-pay

## 2019-08-11 ENCOUNTER — Ambulatory Visit (INDEPENDENT_AMBULATORY_CARE_PROVIDER_SITE_OTHER): Payer: BC Managed Care – PPO | Admitting: Licensed Clinical Social Worker

## 2019-08-11 DIAGNOSIS — F339 Major depressive disorder, recurrent, unspecified: Secondary | ICD-10-CM

## 2019-08-11 DIAGNOSIS — F1121 Opioid dependence, in remission: Secondary | ICD-10-CM

## 2019-08-11 DIAGNOSIS — F1021 Alcohol dependence, in remission: Secondary | ICD-10-CM | POA: Diagnosis not present

## 2019-08-11 DIAGNOSIS — F411 Generalized anxiety disorder: Secondary | ICD-10-CM | POA: Diagnosis not present

## 2019-08-11 NOTE — Progress Notes (Signed)
Virtual Visit via Video Note  I connected with Kevin Andrade on 08/11/19 at  9:00 AM EST by a video enabled telemedicine application and verified that I am speaking with the correct person using two identifiers.   I discussed the limitations of evaluation and management by telemedicine and the availability of in person appointments. The patient expressed understanding and agreed to proceed.  History of Present Illness: Patient is referred for individual therapy after completion of CD-IOP for Opiod Use Disorder and hx of polysubstance abuse disorder, anxiety, depression. (Kratom & opiate pills)   Observations/Objective: Patient presents for his individual therapy appointment via Webex. Patient discussed his psychiatric symptoms and current life events. Patient reports his continued stable moods, and discusses his recovery plan and current stressors. Patient reports he still has cravings almost daily. Discussed the thought trigger craving use cycle. Patient began discussion of his childhood. Will continue discussion at next session.   PLAN: step 1 worksheet, NA book, childhood  Assessment and Plan: Counselor will continue to meet with patient to address treatment plan goals. Patient will continue to follow recommendations of providers and implement skills learned in session.     Follow Up Instructions:  Counselor will send webex request for weekly sessions. I discussed the assessment and treatment plan with the patient. The patient was provided an opportunity to ask questions and all were answered. The patient agreed with the plan and demonstrated an understanding of the instructions.   The patient was advised to call back or seek an in-person evaluation if the symptoms worsen or if the condition fails to improve as anticipated.  I provided 60 minutes of non-face-to-face time during this encounter.   Zala Degrasse S, LCAS

## 2019-08-18 ENCOUNTER — Ambulatory Visit (INDEPENDENT_AMBULATORY_CARE_PROVIDER_SITE_OTHER): Payer: BC Managed Care – PPO | Admitting: Licensed Clinical Social Worker

## 2019-08-18 ENCOUNTER — Encounter (HOSPITAL_COMMUNITY): Payer: Self-pay | Admitting: Licensed Clinical Social Worker

## 2019-08-18 ENCOUNTER — Other Ambulatory Visit: Payer: Self-pay

## 2019-08-18 DIAGNOSIS — F1021 Alcohol dependence, in remission: Secondary | ICD-10-CM

## 2019-08-18 DIAGNOSIS — F1121 Opioid dependence, in remission: Secondary | ICD-10-CM

## 2019-08-18 NOTE — Progress Notes (Signed)
Virtual Visit via Video Note  I connected with Kevin Andrade on 08/18/19 at  9:00 AM EST by a video enabled telemedicine application and verified that I am speaking with the correct person using two identifiers.   I discussed the limitations of evaluation and management by telemedicine and the availability of in person appointments. The patient expressed understanding and agreed to proceed.  History of Present Illness: Patient is referred for individual therapy after completion of CD-IOP for Opiod Use Disorder and hx of polysubstance abuse disorder, anxiety, depression. (Kratom & opiate pills)   Observations/Objective: Patient presents for his individual therapy appointment via Webex. Patient discussed his psychiatric symptoms and current life events. Patient reports his continued stable moods, and discusses his recovery plan and current stressors. Patient has to go to work early today so session will be cut short. Patient described his cravings he's experiencing. Discussed his triggers with possible solutions. Patient described how 2020 has affected him and his goals for next year. Used socratic questions.     PLAN: step 1 worksheet, NA book, childhood  Assessment and Plan: Counselor will continue to meet with patient to address treatment plan goals. Patient will continue to follow recommendations of providers and implement skills learned in session.     Follow Up Instructions:  Counselor will send webex request for weekly sessions. I discussed the assessment and treatment plan with the patient. The patient was provided an opportunity to ask questions and all were answered. The patient agreed with the plan and demonstrated an understanding of the instructions.   The patient was advised to call back or seek an in-person evaluation if the symptoms worsen or if the condition fails to improve as anticipated.  I provided 25 minutes of non-face-to-face time during this  encounter.   Rahel Carlton S, LCAS

## 2019-08-25 ENCOUNTER — Encounter (HOSPITAL_COMMUNITY): Payer: Self-pay | Admitting: Licensed Clinical Social Worker

## 2019-08-25 ENCOUNTER — Ambulatory Visit (INDEPENDENT_AMBULATORY_CARE_PROVIDER_SITE_OTHER): Payer: BC Managed Care – PPO | Admitting: Licensed Clinical Social Worker

## 2019-08-25 ENCOUNTER — Other Ambulatory Visit: Payer: Self-pay

## 2019-08-25 DIAGNOSIS — F1590 Other stimulant use, unspecified, uncomplicated: Secondary | ICD-10-CM

## 2019-08-25 DIAGNOSIS — F1121 Opioid dependence, in remission: Secondary | ICD-10-CM

## 2019-08-25 DIAGNOSIS — F419 Anxiety disorder, unspecified: Secondary | ICD-10-CM | POA: Diagnosis not present

## 2019-08-25 DIAGNOSIS — F339 Major depressive disorder, recurrent, unspecified: Secondary | ICD-10-CM | POA: Diagnosis not present

## 2019-08-25 NOTE — Progress Notes (Signed)
Virtual Visit via Video Note  I connected with Kevin Andrade on 08/25/19 at  9:00 AM EST by a video enabled telemedicine application and verified that I am speaking with the correct person using two identifiers.   I discussed the limitations of evaluation and management by telemedicine and the availability of in person appointments. The patient expressed understanding and agreed to proceed.  History of Present Illness: Patient is referred for individual therapy after completion of CD-IOP for Opiod Use Disorder and hx of polysubstance abuse disorder, anxiety, depression. (Kratom & opiate pills)   Observations/Objective: Patient presents for his individual therapy appointment via Webex. Patient discussed his psychiatric symptoms and current life events. Patient discussed his holiday. He was unable to attend meetings, due to the holidays. Discussed the importance of recovery plans even during holidays. Patient discussed his desire to visit his parents who he hasn't seen in years. Used Socratic questions. Patient was tearful in his description of his childhood and reasons for having minimal contact with parents. Validated his feelings.     PLAN: step 1 worksheet, NA book, childhood  Assessment and Plan: Counselor will continue to meet with patient to address treatment plan goals. Patient will continue to follow recommendations of providers and implement skills learned in session.     Follow Up Instructions:  Counselor will send webex request for weekly sessions. I discussed the assessment and treatment plan with the patient. The patient was provided an opportunity to ask questions and all were answered. The patient agreed with the plan and demonstrated an understanding of the instructions.   The patient was advised to call back or seek an in-person evaluation if the symptoms worsen or if the condition fails to improve as anticipated.  I provided 60 minutes of non-face-to-face time during  this encounter.   Kevin Andrade, LCAS

## 2019-08-27 ENCOUNTER — Ambulatory Visit (HOSPITAL_COMMUNITY): Payer: BC Managed Care – PPO | Admitting: Medical

## 2019-08-27 ENCOUNTER — Ambulatory Visit (INDEPENDENT_AMBULATORY_CARE_PROVIDER_SITE_OTHER): Payer: BC Managed Care – PPO | Admitting: Medical

## 2019-08-27 ENCOUNTER — Encounter (HOSPITAL_COMMUNITY): Payer: Self-pay | Admitting: Medical

## 2019-08-27 DIAGNOSIS — F17210 Nicotine dependence, cigarettes, uncomplicated: Secondary | ICD-10-CM

## 2019-08-27 DIAGNOSIS — F329 Major depressive disorder, single episode, unspecified: Secondary | ICD-10-CM

## 2019-08-27 DIAGNOSIS — Z6372 Alcoholism and drug addiction in family: Secondary | ICD-10-CM

## 2019-08-27 DIAGNOSIS — F1121 Opioid dependence, in remission: Secondary | ICD-10-CM

## 2019-08-27 DIAGNOSIS — F1590 Other stimulant use, unspecified, uncomplicated: Secondary | ICD-10-CM | POA: Diagnosis not present

## 2019-08-27 DIAGNOSIS — F1021 Alcohol dependence, in remission: Secondary | ICD-10-CM

## 2019-08-27 DIAGNOSIS — T7492XS Unspecified child maltreatment, confirmed, sequela: Secondary | ICD-10-CM

## 2019-08-27 DIAGNOSIS — F4312 Post-traumatic stress disorder, chronic: Secondary | ICD-10-CM

## 2019-08-27 DIAGNOSIS — F419 Anxiety disorder, unspecified: Secondary | ICD-10-CM

## 2019-08-27 DIAGNOSIS — Z811 Family history of alcohol abuse and dependence: Secondary | ICD-10-CM

## 2019-08-27 DIAGNOSIS — F1994 Other psychoactive substance use, unspecified with psychoactive substance-induced mood disorder: Secondary | ICD-10-CM

## 2019-08-27 DIAGNOSIS — F32A Depression, unspecified: Secondary | ICD-10-CM

## 2019-08-27 DIAGNOSIS — F19982 Other psychoactive substance use, unspecified with psychoactive substance-induced sleep disorder: Secondary | ICD-10-CM

## 2019-08-27 MED ORDER — ESCITALOPRAM OXALATE 20 MG PO TABS: 20.0000 mg | ORAL_TABLET | Freq: Every day | ORAL | 1 refills | Status: DC

## 2019-08-27 MED ORDER — PREGABALIN 75 MG PO CAPS: 75.0000 mg | ORAL_CAPSULE | Freq: Every day | ORAL | 0 refills | Status: DC

## 2019-08-27 MED ORDER — TRAZODONE HCL 50 MG PO TABS: 50.0000 mg | ORAL_TABLET | Freq: Every evening | ORAL | 1 refills | Status: DC | PRN
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
## 2019-09-01 ENCOUNTER — Ambulatory Visit (INDEPENDENT_AMBULATORY_CARE_PROVIDER_SITE_OTHER): Payer: BC Managed Care – PPO | Admitting: Licensed Clinical Social Worker

## 2019-09-01 ENCOUNTER — Encounter (HOSPITAL_COMMUNITY): Payer: Self-pay | Admitting: Licensed Clinical Social Worker

## 2019-09-01 ENCOUNTER — Other Ambulatory Visit: Payer: Self-pay

## 2019-09-01 DIAGNOSIS — F329 Major depressive disorder, single episode, unspecified: Secondary | ICD-10-CM

## 2019-09-01 DIAGNOSIS — F419 Anxiety disorder, unspecified: Secondary | ICD-10-CM

## 2019-09-01 DIAGNOSIS — F1590 Other stimulant use, unspecified, uncomplicated: Secondary | ICD-10-CM | POA: Diagnosis not present

## 2019-09-01 DIAGNOSIS — F32A Depression, unspecified: Secondary | ICD-10-CM

## 2019-09-01 DIAGNOSIS — F1121 Opioid dependence, in remission: Secondary | ICD-10-CM

## 2019-09-01 NOTE — Progress Notes (Signed)
Virtual Visit via Video Note  I connected with Kevin Andrade on 09/01/19 at  9:00 AM EST by a video enabled telemedicine application and verified that I am speaking with the correct person using two identifiers.   I discussed the limitations of evaluation and management by telemedicine and the availability of in person appointments. The patient expressed understanding and agreed to proceed.  History of Present Illness: Patient is referred for individual therapy after completion of CD-IOP for Opiod Use Disorder and hx of polysubstance abuse disorder, anxiety, depression. (Kratom & opiate pills)   Observations/Objective: Patient presents for his individual therapy appointment via Webex. Patient discussed his psychiatric symptoms and current life events. Patient discussed his recovery within the last week: reading his NA book, working on powerlessness, attending NA meetings. Discussed the importance of getting a sponsor, especially in the midst of pandemic. Role played asking for a sponsor in a zoom NA meeting. Patient discussed his negative thoughts (using) he experiences in the mornings. Used CBT to assist patient in reframing his negative thoughts. Spent part of the session educating and processing the meaning of powerlessness.  PLAN: step 1 worksheet, NA book, childhood  Assessment and Plan: Counselor will continue to meet with patient to address treatment plan goals. Patient will continue to follow recommendations of providers and implement skills learned in session.   Follow Up Instructions: Counselor will send webex request for weekly sessions. I discussed the assessment and treatment plan with the patient. The patient was provided an opportunity to ask questions and all were answered. The patient agreed with the plan and demonstrated an understanding of the instructions.   The patient was advised to call back or seek an in-person evaluation if the symptoms worsen or if the condition  fails to improve as anticipated.  I provided 60 minutes of non-face-to-face time during this encounter.   Anaalicia Reimann S, LCAS

## 2019-09-08 ENCOUNTER — Other Ambulatory Visit: Payer: Self-pay

## 2019-09-08 ENCOUNTER — Encounter (HOSPITAL_COMMUNITY): Payer: Self-pay | Admitting: Licensed Clinical Social Worker

## 2019-09-08 ENCOUNTER — Ambulatory Visit (HOSPITAL_COMMUNITY): Payer: BC Managed Care – PPO | Admitting: Licensed Clinical Social Worker

## 2019-09-08 DIAGNOSIS — F329 Major depressive disorder, single episode, unspecified: Secondary | ICD-10-CM

## 2019-09-08 DIAGNOSIS — F1590 Other stimulant use, unspecified, uncomplicated: Secondary | ICD-10-CM

## 2019-09-08 DIAGNOSIS — F32A Depression, unspecified: Secondary | ICD-10-CM

## 2019-09-08 DIAGNOSIS — F419 Anxiety disorder, unspecified: Secondary | ICD-10-CM

## 2019-09-08 DIAGNOSIS — F1121 Opioid dependence, in remission: Secondary | ICD-10-CM
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
## 2019-09-15 ENCOUNTER — Other Ambulatory Visit: Payer: Self-pay

## 2019-09-15 ENCOUNTER — Ambulatory Visit (INDEPENDENT_AMBULATORY_CARE_PROVIDER_SITE_OTHER): Payer: BC Managed Care – PPO | Admitting: Licensed Clinical Social Worker

## 2019-09-15 ENCOUNTER — Encounter (HOSPITAL_COMMUNITY): Payer: Self-pay | Admitting: Licensed Clinical Social Worker

## 2019-09-15 DIAGNOSIS — F1590 Other stimulant use, unspecified, uncomplicated: Secondary | ICD-10-CM | POA: Diagnosis not present

## 2019-09-15 DIAGNOSIS — F1121 Opioid dependence, in remission: Secondary | ICD-10-CM

## 2019-09-15 DIAGNOSIS — F329 Major depressive disorder, single episode, unspecified: Secondary | ICD-10-CM

## 2019-09-15 DIAGNOSIS — F419 Anxiety disorder, unspecified: Secondary | ICD-10-CM | POA: Diagnosis not present

## 2019-09-15 DIAGNOSIS — F32A Depression, unspecified: Secondary | ICD-10-CM
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
## 2019-09-22 ENCOUNTER — Ambulatory Visit (INDEPENDENT_AMBULATORY_CARE_PROVIDER_SITE_OTHER): Payer: BC Managed Care – PPO | Admitting: Licensed Clinical Social Worker

## 2019-09-22 ENCOUNTER — Other Ambulatory Visit: Payer: Self-pay

## 2019-09-22 ENCOUNTER — Encounter (HOSPITAL_COMMUNITY): Payer: Self-pay | Admitting: Licensed Clinical Social Worker

## 2019-09-22 DIAGNOSIS — F329 Major depressive disorder, single episode, unspecified: Secondary | ICD-10-CM

## 2019-09-22 DIAGNOSIS — F1121 Opioid dependence, in remission: Secondary | ICD-10-CM | POA: Diagnosis not present

## 2019-09-22 DIAGNOSIS — F419 Anxiety disorder, unspecified: Secondary | ICD-10-CM | POA: Diagnosis not present

## 2019-09-22 DIAGNOSIS — F1590 Other stimulant use, unspecified, uncomplicated: Secondary | ICD-10-CM | POA: Diagnosis not present

## 2019-09-22 DIAGNOSIS — F32A Depression, unspecified: Secondary | ICD-10-CM

## 2019-09-22 NOTE — Progress Notes (Signed)
Virtual Visit via Video Note  I connected with Kevin Andrade on 09/22/19 at  9:00 AM EST by a video enabled telemedicine application and verified that I am speaking with the correct person using two identifiers.   I discussed the limitations of evaluation and management by telemedicine and the availability of in person appointments. The patient expressed understanding and agreed to proceed.  History of Present Illness: Patient is referred for individual therapy after completion of CD-IOP for Opiod Use Disorder and hx of polysubstance abuse disorder, anxiety, depression. (Kratom & opiate pills)   Observations/Objective: Patient presents for his individual therapy appointment via Webex. Patient discussed his psychiatric symptoms and current life events. Patient discussed his symptoms and coping skills. Patient discussed his recovery plans for the week to include meetings, reading his NA book, talking to people in recovery. Patient discussed his thoughts and cravings. Coached patient on the cycle of addiction. Patient had a dr appt this morning so session was cut short. Made new appointments for February.     PLAN: step 1 worksheet, NA book, childhood  Assessment and Plan: Counselor will continue to meet with patient to address treatment plan goals. Patient will continue to follow recommendations of providers and implement skills learned in session.   Follow Up Instructions: Counselor will send webex request for weekly sessions. I discussed the assessment and treatment plan with the patient. The patient was provided an opportunity to ask questions and all were answered. The patient agreed with the plan and demonstrated an understanding of the instructions.   The patient was advised to call back or seek an in-person evaluation if the symptoms worsen or if the condition fails to improve as anticipated.  I provided 45 minutes of non-face-to-face time during this encounter.   Dayami Taitt  S, LCAS

## 2019-09-29 ENCOUNTER — Encounter (HOSPITAL_COMMUNITY): Payer: Self-pay | Admitting: Licensed Clinical Social Worker

## 2019-09-29 ENCOUNTER — Other Ambulatory Visit: Payer: Self-pay

## 2019-09-29 ENCOUNTER — Ambulatory Visit (INDEPENDENT_AMBULATORY_CARE_PROVIDER_SITE_OTHER): Payer: BC Managed Care – PPO | Admitting: Licensed Clinical Social Worker

## 2019-09-29 DIAGNOSIS — F419 Anxiety disorder, unspecified: Secondary | ICD-10-CM

## 2019-09-29 DIAGNOSIS — F1121 Opioid dependence, in remission: Secondary | ICD-10-CM

## 2019-09-29 DIAGNOSIS — F329 Major depressive disorder, single episode, unspecified: Secondary | ICD-10-CM

## 2019-09-29 DIAGNOSIS — F1590 Other stimulant use, unspecified, uncomplicated: Secondary | ICD-10-CM | POA: Diagnosis not present

## 2019-09-29 DIAGNOSIS — F32A Depression, unspecified: Secondary | ICD-10-CM

## 2019-09-29 NOTE — Progress Notes (Signed)
Virtual Visit via Video Note  I connected with Kevin Andrade on 09/29/19 at  9:00 AM EST by a video enabled telemedicine application and verified that I am speaking with the correct person using two identifiers.   I discussed the limitations of evaluation and management by telemedicine and the availability of in person appointments. The patient expressed understanding and agreed to proceed.  History of Present Illness: Patient is referred for individual therapy after completion of CD-IOP for Opiod Use Disorder and hx of polysubstance abuse disorder, anxiety, depression. (Kratom & opiate pills)   Observations/Objective: Patient presents for his individual therapy appointment via Webex. Patient discussed his psychiatric symptoms and current life events. Reviewed treatment plan where patient verbalized his acceptance of plan. Patient discussed his symptoms and coping skills. Patient discussed his recovery plans for the week to include meetings, reading his NA book, talking to people in recovery. Patient discussed unresolved/generational/historic issues within his family of origin which he maintains is the core conflict of his anxiety and depression. Used CBT to assist patient to begin to resolve the core conflict, the source of his anxiety.  Assessment and Plan: Counselor will continue to meet with patient to address treatment plan goals. Patient will continue to follow recommendations of providers and implement skills learned in session.   Follow Up Instructions: Counselor will send webex request for weekly sessions. I discussed the assessment and treatment plan with the patient. The patient was provided an opportunity to ask questions and all were answered. The patient agreed with the plan and demonstrated an understanding of the instructions.   The patient was advised to call back or seek an in-person evaluation if the symptoms worsen or if the condition fails to improve as anticipated.  I  provided 60 minutes of non-face-to-face time during this encounter.   Shaylah Mcghie S, LCAS

## 2019-10-06 ENCOUNTER — Other Ambulatory Visit: Payer: Self-pay

## 2019-10-06 ENCOUNTER — Encounter (HOSPITAL_COMMUNITY): Payer: Self-pay | Admitting: Licensed Clinical Social Worker

## 2019-10-06 ENCOUNTER — Ambulatory Visit (INDEPENDENT_AMBULATORY_CARE_PROVIDER_SITE_OTHER): Payer: BC Managed Care – PPO | Admitting: Licensed Clinical Social Worker

## 2019-10-06 DIAGNOSIS — F1121 Opioid dependence, in remission: Secondary | ICD-10-CM

## 2019-10-06 DIAGNOSIS — F419 Anxiety disorder, unspecified: Secondary | ICD-10-CM

## 2019-10-06 DIAGNOSIS — F1590 Other stimulant use, unspecified, uncomplicated: Secondary | ICD-10-CM | POA: Diagnosis not present

## 2019-10-06 DIAGNOSIS — F329 Major depressive disorder, single episode, unspecified: Secondary | ICD-10-CM | POA: Diagnosis not present

## 2019-10-06 DIAGNOSIS — F32A Depression, unspecified: Secondary | ICD-10-CM

## 2019-10-06 NOTE — Progress Notes (Signed)
Virtual Visit via Video Note  I connected with Kevin Andrade on 10/06/19 at  9:00 AM EST by a video enabled telemedicine application and verified that I am speaking with the correct person using two identifiers.   I discussed the limitations of evaluation and management by telemedicine and the availability of in person appointments. The patient expressed understanding and agreed to proceed.  History of Present Illness: Patient is referred for individual therapy after completion of CD-IOP for Opiod Use Disorder and hx of polysubstance abuse disorder, anxiety, depression. (Kratom & opiate pills)   Observations/Objective: Patient presents anxious for his individual therapy appointment via Webex. Patient discussed his psychiatric symptoms and current life events. Patient reports "My moods have been up and down. I did not have a good weekend. I don't think my meds are working." Reviewed medications with patient. Suggested patient call Leonette Most, [place], who he sees for medication monitoring to review his medications and make an appointment. Gave patient information on genetic testing. Coached patient on  coping skills for mood flucuation: meditation, breathing exercises, adult coloring books.   Assessment and Plan: Counselor will continue to meet with patient to address treatment plan goals. Patient will continue to follow recommendations of providers and implement skills learned in session.   Follow Up Instructions: Counselor will send webex request for weekly sessions. I discussed the assessment and treatment plan with the patient. The patient was provided an opportunity to ask questions and all were answered. The patient agreed with the plan and demonstrated an understanding of the instructions.   The patient was advised to call back or seek an in-person evaluation if the symptoms worsen or if the condition fails to improve as anticipated.  I provided 60 minutes of non-face-to-face time during this  encounter.   Duncan Alejandro S, LCAS

## 2019-10-08 ENCOUNTER — Encounter (HOSPITAL_COMMUNITY): Payer: Self-pay | Admitting: Medical

## 2019-10-08 ENCOUNTER — Ambulatory Visit (INDEPENDENT_AMBULATORY_CARE_PROVIDER_SITE_OTHER): Payer: BC Managed Care – PPO | Admitting: Medical

## 2019-10-08 ENCOUNTER — Other Ambulatory Visit: Payer: Self-pay

## 2019-10-08 DIAGNOSIS — F1590 Other stimulant use, unspecified, uncomplicated: Secondary | ICD-10-CM

## 2019-10-08 DIAGNOSIS — T7492XS Unspecified child maltreatment, confirmed, sequela: Secondary | ICD-10-CM

## 2019-10-08 DIAGNOSIS — F1121 Opioid dependence, in remission: Secondary | ICD-10-CM | POA: Diagnosis not present

## 2019-10-08 DIAGNOSIS — F1021 Alcohol dependence, in remission: Secondary | ICD-10-CM

## 2019-10-08 DIAGNOSIS — F1911 Other psychoactive substance abuse, in remission: Secondary | ICD-10-CM | POA: Diagnosis not present

## 2019-10-08 DIAGNOSIS — F419 Anxiety disorder, unspecified: Secondary | ICD-10-CM

## 2019-10-08 DIAGNOSIS — Z811 Family history of alcohol abuse and dependence: Secondary | ICD-10-CM

## 2019-10-08 DIAGNOSIS — Z6372 Alcoholism and drug addiction in family: Secondary | ICD-10-CM

## 2019-10-08 DIAGNOSIS — F17210 Nicotine dependence, cigarettes, uncomplicated: Secondary | ICD-10-CM

## 2019-10-08 DIAGNOSIS — F3181 Bipolar II disorder: Secondary | ICD-10-CM

## 2019-10-08 MED ORDER — ARIPIPRAZOLE 10 MG PO TABS: 10.0000 mg | ORAL_TABLET | Freq: Every day | ORAL | 2 refills | Status: DC

## 2019-10-08 MED ORDER — PREGABALIN 150 MG PO CAPS: 150.0000 mg | ORAL_CAPSULE | Freq: Three times a day (TID) | ORAL | 2 refills | Status: DC

## 2019-10-08 MED ORDER — ESCITALOPRAM OXALATE 20 MG PO TABS: 20.0000 mg | ORAL_TABLET | Freq: Every day | ORAL | 1 refills | Status: DC
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
## 2019-10-08 NOTE — Progress Notes (Signed)
error 

## 2019-10-13 ENCOUNTER — Other Ambulatory Visit: Payer: Self-pay

## 2019-10-13 ENCOUNTER — Ambulatory Visit (INDEPENDENT_AMBULATORY_CARE_PROVIDER_SITE_OTHER): Payer: BC Managed Care – PPO | Admitting: Licensed Clinical Social Worker

## 2019-10-13 ENCOUNTER — Encounter (HOSPITAL_COMMUNITY): Payer: Self-pay | Admitting: Licensed Clinical Social Worker

## 2019-10-13 DIAGNOSIS — F1121 Opioid dependence, in remission: Secondary | ICD-10-CM | POA: Diagnosis not present

## 2019-10-13 DIAGNOSIS — F329 Major depressive disorder, single episode, unspecified: Secondary | ICD-10-CM | POA: Diagnosis not present

## 2019-10-13 DIAGNOSIS — F1590 Other stimulant use, unspecified, uncomplicated: Secondary | ICD-10-CM

## 2019-10-13 DIAGNOSIS — F419 Anxiety disorder, unspecified: Secondary | ICD-10-CM | POA: Diagnosis not present

## 2019-10-13 DIAGNOSIS — F3181 Bipolar II disorder: Secondary | ICD-10-CM

## 2019-10-13 DIAGNOSIS — F32A Depression, unspecified: Secondary | ICD-10-CM

## 2019-10-13 NOTE — Progress Notes (Signed)
Virtual Visit via Video Note  I connected with Kevin Andrade on 10/13/19 at  9:00 AM EST by a video enabled telemedicine application and verified that I am speaking with the correct person using two identifiers.   I discussed the limitations of evaluation and management by telemedicine and the availability of in person appointments. The patient expressed understanding and agreed to proceed.  History of Present Illness: Patient is referred for individual therapy after completion of CD-IOP for Opiod Use Disorder and hx of polysubstance abuse disorder, anxiety, depression. (Kratom & opiate pills)   Observations/Objective: Patient presents anxious for his individual therapy appointment via Webex. Patient discussed his psychiatric symptoms and current life events. Patient reports he met with PA C. [name] who gave him a new dx of Bipolar 2. Patient shared his feelings about the new dx and new medication. Educated patient on Bipolar 2 diagnosis. Patient discussed  cravings vs habit. Patient reports on his recovery: meetings, reading NA text, recovery apps, sponsorship, NA virtual meetings.   Assessment and Plan: Counselor will continue to meet with patient to address treatment plan goals. Patient will continue to follow recommendations of providers and implement skills learned in session.   Follow Up Instructions: Counselor will send webex request for weekly sessions. I discussed the assessment and treatment plan with the patient. The patient was provided an opportunity to ask questions and all were answered. The patient agreed with the plan and demonstrated an understanding of the instructions.   The patient was advised to call back or seek an in-person evaluation if the symptoms worsen or if the condition fails to improve as anticipated.  I provided 60 minutes of non-face-to-face time during this encounter.   Kia Stavros S, LCAS

## 2019-10-20 ENCOUNTER — Ambulatory Visit (INDEPENDENT_AMBULATORY_CARE_PROVIDER_SITE_OTHER): Payer: BC Managed Care – PPO | Admitting: Licensed Clinical Social Worker

## 2019-10-20 ENCOUNTER — Encounter (HOSPITAL_COMMUNITY): Payer: Self-pay | Admitting: Licensed Clinical Social Worker

## 2019-10-20 ENCOUNTER — Other Ambulatory Visit: Payer: Self-pay

## 2019-10-20 DIAGNOSIS — F1590 Other stimulant use, unspecified, uncomplicated: Secondary | ICD-10-CM

## 2019-10-20 DIAGNOSIS — F32A Depression, unspecified: Secondary | ICD-10-CM

## 2019-10-20 DIAGNOSIS — F1121 Opioid dependence, in remission: Secondary | ICD-10-CM

## 2019-10-20 DIAGNOSIS — F3181 Bipolar II disorder: Secondary | ICD-10-CM

## 2019-10-20 DIAGNOSIS — F419 Anxiety disorder, unspecified: Secondary | ICD-10-CM

## 2019-10-20 DIAGNOSIS — F329 Major depressive disorder, single episode, unspecified: Secondary | ICD-10-CM

## 2019-10-20 NOTE — Progress Notes (Signed)
Virtual Visit via Video Note  I connected with Kevin Andrade on 10/20/19 at  9:00 AM EST by a video enabled telemedicine application and verified that I am speaking with the correct person using two identifiers.   I discussed the limitations of evaluation and management by telemedicine and the availability of in person appointments. The patient expressed understanding and agreed to proceed.  History of Present Illness: Patient is referred for individual therapy after completion of CD-IOP for Opiod Use Disorder and hx of polysubstance abuse disorder, anxiety, depression. (Kratom & opiate pills)   Observations/Objective: Patient presents WNL for his individual therapy appointment via Webex. Patient discussed his psychiatric symptoms and current life events. Patient reports his moods have been stable and he feels the new medication is working well. Patient reports he has started researching his family tree using ancestry."i felt it becoming an obsession over the weekend, coming before my recovery." Educated patient on obsessions. Patient just attended 2 NA meetings this week. Encouraged patient to attend more meetings, the importance of attending meetings. Patient is meeting with someone this weekend who he thinks may be sponsor. Educated patient on sponsorship. Patient discussed continued cravings.    Assessment and Plan: Counselor will continue to meet with patient to address treatment plan goals. Patient will continue to follow recommendations of providers and implement skills learned in session.   Follow Up Instructions: Counselor will send webex request for weekly sessions. I discussed the assessment and treatment plan with the patient. The patient was provided an opportunity to ask questions and all were answered. The patient agreed with the plan and demonstrated an understanding of the instructions.   The patient was advised to call back or seek an in-person evaluation if the symptoms  worsen or if the condition fails to improve as anticipated.  I provided 60 minutes of non-face-to-face time during this encounter.   Jerrit Horen S, LCAS

## 2019-10-22 ENCOUNTER — Ambulatory Visit (HOSPITAL_COMMUNITY): Payer: BC Managed Care – PPO | Admitting: Medical

## 2019-10-22 ENCOUNTER — Encounter (HOSPITAL_COMMUNITY): Payer: Self-pay | Admitting: Medical

## 2019-10-22 ENCOUNTER — Other Ambulatory Visit: Payer: Self-pay

## 2019-10-22 ENCOUNTER — Ambulatory Visit (INDEPENDENT_AMBULATORY_CARE_PROVIDER_SITE_OTHER): Payer: BC Managed Care – PPO | Admitting: Medical

## 2019-10-22 DIAGNOSIS — T7492XS Unspecified child maltreatment, confirmed, sequela: Secondary | ICD-10-CM

## 2019-10-22 DIAGNOSIS — Z811 Family history of alcohol abuse and dependence: Secondary | ICD-10-CM

## 2019-10-22 DIAGNOSIS — F1590 Other stimulant use, unspecified, uncomplicated: Secondary | ICD-10-CM | POA: Diagnosis not present

## 2019-10-22 DIAGNOSIS — F4312 Post-traumatic stress disorder, chronic: Secondary | ICD-10-CM

## 2019-10-22 DIAGNOSIS — F1121 Opioid dependence, in remission: Secondary | ICD-10-CM | POA: Diagnosis not present

## 2019-10-22 DIAGNOSIS — F1911 Other psychoactive substance abuse, in remission: Secondary | ICD-10-CM | POA: Diagnosis not present

## 2019-10-22 DIAGNOSIS — F419 Anxiety disorder, unspecified: Secondary | ICD-10-CM

## 2019-10-22 DIAGNOSIS — F1994 Other psychoactive substance use, unspecified with psychoactive substance-induced mood disorder: Secondary | ICD-10-CM

## 2019-10-22 DIAGNOSIS — Z6372 Alcoholism and drug addiction in family: Secondary | ICD-10-CM
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
## 2019-10-27 ENCOUNTER — Other Ambulatory Visit: Payer: Self-pay

## 2019-10-27 ENCOUNTER — Encounter (HOSPITAL_COMMUNITY): Payer: Self-pay | Admitting: Licensed Clinical Social Worker

## 2019-10-27 ENCOUNTER — Telehealth (HOSPITAL_COMMUNITY): Payer: Self-pay | Admitting: Medical

## 2019-10-27 ENCOUNTER — Ambulatory Visit (INDEPENDENT_AMBULATORY_CARE_PROVIDER_SITE_OTHER): Payer: BC Managed Care – PPO | Admitting: Licensed Clinical Social Worker

## 2019-10-27 DIAGNOSIS — F419 Anxiety disorder, unspecified: Secondary | ICD-10-CM | POA: Diagnosis not present

## 2019-10-27 DIAGNOSIS — F1590 Other stimulant use, unspecified, uncomplicated: Secondary | ICD-10-CM

## 2019-10-27 DIAGNOSIS — F329 Major depressive disorder, single episode, unspecified: Secondary | ICD-10-CM | POA: Diagnosis not present

## 2019-10-27 DIAGNOSIS — F1121 Opioid dependence, in remission: Secondary | ICD-10-CM | POA: Diagnosis not present

## 2019-10-27 DIAGNOSIS — F32A Depression, unspecified: Secondary | ICD-10-CM

## 2019-10-27 NOTE — Progress Notes (Signed)
Virtual Visit via Video Note  I connected with Kevin Andrade on 10/27/19 at  9:00 AM EST by a video enabled telemedicine application and verified that I am speaking with the correct person using two identifiers.   I discussed the limitations of evaluation and management by telemedicine and the availability of in person appointments. The patient expressed understanding and agreed to proceed.  History of Present Illness: Patient is referred for individual therapy after completion of CD-IOP for Opiod Use Disorder and hx of polysubstance abuse disorder, anxiety, depression. (Kratom & opiate pills)   Observations/Objective: Patient presents WNL for his individual therapy appointment via Webex. Patient discussed his psychiatric symptoms and current life events. Patient reports his moods have been stable and he feels the new medication is working well, however he has recently begun having involuntary arm movements. Unsure if it's a side effect of the new medication Abilify. Suggested patient call [name], [place], to discuss the new arm movements. Patient met with a man who he feels may be his sponsor. They are meeting again this weekend to finalize sponsorship. Discussed accountability, recovery, meeting attendance, sponsorship.  Assessment and Plan: Counselor will continue to meet with patient to address treatment plan goals. Patient will continue to follow recommendations of providers and implement skills learned in session.   Follow Up Instructions: Counselor will send webex request for weekly sessions. I discussed the assessment and treatment plan with the patient. The patient was provided an opportunity to ask questions and all were answered. The patient agreed with the plan and demonstrated an understanding of the instructions.   The patient was advised to call back or seek an in-person evaluation if the symptoms worsen or if the condition fails to improve as anticipated.  I provided 60 minutes of  non-face-to-face time during this encounter.   Perri Lamagna S, LCAS

## 2019-10-27 NOTE — Telephone Encounter (Signed)
Patient was started on abilify a few weeks ago.  Since Sunday his arms has became jittery. He believes it is a reaction to the abilify.   Please advise.  cb # [phone]

## 2019-10-28 ENCOUNTER — Other Ambulatory Visit (HOSPITAL_COMMUNITY): Payer: Self-pay | Admitting: Medical

## 2019-10-28 NOTE — Telephone Encounter (Signed)
Contacted patient-note recorded. Schedule for face to face if symptoms return/he calls back

## 2019-10-28 NOTE — Progress Notes (Signed)
Pt called to report 2 days of "jittery" arms that has gone away past 2 days.No other muscle twitching. + increased sweating at work. Does not sound like TD. Pt will monitor and keep up fluids with Gatorade.  Call back if symptoms return to arrange face to face visit

## 2019-11-03 ENCOUNTER — Ambulatory Visit (INDEPENDENT_AMBULATORY_CARE_PROVIDER_SITE_OTHER): Payer: BC Managed Care – PPO | Admitting: Licensed Clinical Social Worker

## 2019-11-03 ENCOUNTER — Other Ambulatory Visit: Payer: Self-pay

## 2019-11-03 ENCOUNTER — Encounter (HOSPITAL_COMMUNITY): Payer: Self-pay | Admitting: Licensed Clinical Social Worker

## 2019-11-03 DIAGNOSIS — F1121 Opioid dependence, in remission: Secondary | ICD-10-CM

## 2019-11-03 DIAGNOSIS — F329 Major depressive disorder, single episode, unspecified: Secondary | ICD-10-CM

## 2019-11-03 DIAGNOSIS — F419 Anxiety disorder, unspecified: Secondary | ICD-10-CM

## 2019-11-03 DIAGNOSIS — F1590 Other stimulant use, unspecified, uncomplicated: Secondary | ICD-10-CM | POA: Diagnosis not present

## 2019-11-03 DIAGNOSIS — F32A Depression, unspecified: Secondary | ICD-10-CM

## 2019-11-03 NOTE — Progress Notes (Signed)
Virtual Visit via Video Note  I connected with Kevin Andrade on 11/03/19 at  9:00 AM EST by a video enabled telemedicine application and verified that I am speaking with the correct person using two identifiers.   I discussed the limitations of evaluation and management by telemedicine and the availability of in person appointments. The patient expressed understanding and agreed to proceed.  History of Present Illness: Patient is referred for individual therapy after completion of CD-IOP for Opiod Use Disorder and hx of polysubstance abuse disorder, anxiety, depression. (Kratom & opiate pills)   Observations/Objective: Patient presents anxious for his individual therapy appointment via Webex. Patient discussed his psychiatric symptoms and current life events. Patient reports his moods have been stable. Patient has to end the session early due to going into work early, mandated overtime. Patient talked with C. Kober about side effects of the medication, but the side effects have subsided. Patient met with temporary sponsor again this past weekend and is moving towards full sponsorship. Educated patient on the importance of sponsorship, the meaning of home group. Patient continues to have cravings. Explored cravings and triggers with patient, and interrupting thoughts. Emailed informational sheet on triggers and cravings. Will review at next session.    Assessment and Plan: Counselor will continue to meet with patient to address treatment plan goals. Patient will continue to follow recommendations of providers and implement skills learned in session.   Follow Up Instructions: Counselor will send webex request for weekly sessions. I discussed the assessment and treatment plan with the patient. The patient was provided an opportunity to ask questions and all were answered. The patient agreed with the plan and demonstrated an understanding of the instructions.   The patient was advised to call  back or seek an in-person evaluation if the symptoms worsen or if the condition fails to improve as anticipated.  I provided 30 minutes of non-face-to-face time during this encounter.   , S, LCAS   

## 2019-11-10 ENCOUNTER — Ambulatory Visit (INDEPENDENT_AMBULATORY_CARE_PROVIDER_SITE_OTHER): Payer: BC Managed Care – PPO | Admitting: Licensed Clinical Social Worker

## 2019-11-10 ENCOUNTER — Encounter (HOSPITAL_COMMUNITY): Payer: Self-pay | Admitting: Licensed Clinical Social Worker

## 2019-11-10 ENCOUNTER — Other Ambulatory Visit: Payer: Self-pay

## 2019-11-10 DIAGNOSIS — F1121 Opioid dependence, in remission: Secondary | ICD-10-CM

## 2019-11-10 DIAGNOSIS — F1590 Other stimulant use, unspecified, uncomplicated: Secondary | ICD-10-CM

## 2019-11-10 DIAGNOSIS — F419 Anxiety disorder, unspecified: Secondary | ICD-10-CM

## 2019-11-10 DIAGNOSIS — F329 Major depressive disorder, single episode, unspecified: Secondary | ICD-10-CM | POA: Diagnosis not present

## 2019-11-10 DIAGNOSIS — F32A Depression, unspecified: Secondary | ICD-10-CM

## 2019-11-10 NOTE — Progress Notes (Signed)
Virtual Visit via Video Note  I connected with Kevin Andrade on 11/10/19 at  9:00 AM EDT by a video enabled telemedicine application and verified that I am speaking with the correct person using two identifiers.   I discussed the limitations of evaluation and management by telemedicine and the availability of in person appointments. The patient expressed understanding and agreed to proceed.  History of Present Illness: Patient is referred for individual therapy after completion of CD-IOP for Opiod Use Disorder and hx of polysubstance abuse disorder, anxiety, depression. (Kratom & opiate pills)   Observations/Objective: Patient presents WNL for his individual therapy appointment via Webex. Patient discussed his psychiatric symptoms and current life events. Patient reports his moods have been stable. Patient was excited to share his son has reached out wanting to have a relationship again. He and his wife are elated especially in seeing the grandchildren. Provided a platform for patient to talk about his relationship with his son and daughter in law. Clinician utilized MI OARS to reflect and summarize thoughts and feelings. Reviewed informational sheet on triggers and cravings, with patient identifying his triggers. Patient has decided on his sponsor and will call him today to confirm. Discussed the benefits of sponsorship.      Follow Up Instructions: Counselor will send webex request for weekly sessions. I discussed the assessment and treatment plan with the patient. The patient was provided an opportunity to ask questions and all were answered. The patient agreed with the plan and demonstrated an understanding of the instructions.   The patient was advised to call back or seek an in-person evaluation if the symptoms worsen or if the condition fails to improve as anticipated.  I provided 60 minutes of non-face-to-face time during this encounter.   Kevin Andrade S, LCAS

## 2019-11-17 ENCOUNTER — Ambulatory Visit (INDEPENDENT_AMBULATORY_CARE_PROVIDER_SITE_OTHER): Payer: BC Managed Care – PPO | Admitting: Licensed Clinical Social Worker

## 2019-11-17 ENCOUNTER — Encounter (HOSPITAL_COMMUNITY): Payer: Self-pay | Admitting: Licensed Clinical Social Worker

## 2019-11-17 ENCOUNTER — Other Ambulatory Visit: Payer: Self-pay

## 2019-11-17 DIAGNOSIS — F1590 Other stimulant use, unspecified, uncomplicated: Secondary | ICD-10-CM | POA: Diagnosis not present

## 2019-11-17 DIAGNOSIS — F1121 Opioid dependence, in remission: Secondary | ICD-10-CM | POA: Diagnosis not present

## 2019-11-17 DIAGNOSIS — F32A Depression, unspecified: Secondary | ICD-10-CM

## 2019-11-17 DIAGNOSIS — F329 Major depressive disorder, single episode, unspecified: Secondary | ICD-10-CM

## 2019-11-17 DIAGNOSIS — F419 Anxiety disorder, unspecified: Secondary | ICD-10-CM

## 2019-11-17 NOTE — Progress Notes (Signed)
Virtual Visit via Video Note  I connected with Kevin Andrade on 11/17/19 at  9:00 AM EDT by a video enabled telemedicine application and verified that I am speaking with the correct person using two identifiers.   I discussed the limitations of evaluation and management by telemedicine and the availability of in person appointments. The patient expressed understanding and agreed to proceed.  History of Present Illness: Patient is referred for individual therapy after completion of CD-IOP for Opiod Use Disorder and hx of polysubstance abuse disorder, anxiety, depression. (Kratom & opiate pills)   Observations/Objective: Patient presents WNL for his individual therapy appointment via Webex. Patient discussed his psychiatric symptoms and current life events. Patient described his continued positive relationship with his son and his family. Discussed relationship circles with patient. Patient described his recovery efforts and his new sponsor. Patient described his meetings this week: 2 in person and 5 virtual. Patient described his feelings about being called an "addict." Validated his feelings.   Follow Up Instructions: Counselor will send webex request for weekly sessions. I discussed the assessment and treatment plan with the patient. The patient was provided an opportunity to ask questions and all were answered. The patient agreed with the plan and demonstrated an understanding of the instructions.   The patient was advised to call back or seek an in-person evaluation if the symptoms worsen or if the condition fails to improve as anticipated.  I provided 60 minutes of non-face-to-face time during this encounter.   Kevin Andrade S, LCAS

## 2019-11-19 ENCOUNTER — Ambulatory Visit (INDEPENDENT_AMBULATORY_CARE_PROVIDER_SITE_OTHER): Payer: BC Managed Care – PPO | Admitting: Medical

## 2019-11-19 DIAGNOSIS — Z811 Family history of alcohol abuse and dependence: Secondary | ICD-10-CM

## 2019-11-19 DIAGNOSIS — F1911 Other psychoactive substance abuse, in remission: Secondary | ICD-10-CM

## 2019-11-19 DIAGNOSIS — F4312 Post-traumatic stress disorder, chronic: Secondary | ICD-10-CM

## 2019-11-19 DIAGNOSIS — T7492XS Unspecified child maltreatment, confirmed, sequela: Secondary | ICD-10-CM

## 2019-11-19 DIAGNOSIS — F419 Anxiety disorder, unspecified: Secondary | ICD-10-CM

## 2019-11-19 DIAGNOSIS — F1590 Other stimulant use, unspecified, uncomplicated: Secondary | ICD-10-CM | POA: Diagnosis not present

## 2019-11-19 DIAGNOSIS — Z6372 Alcoholism and drug addiction in family: Secondary | ICD-10-CM

## 2019-11-19 DIAGNOSIS — F1121 Opioid dependence, in remission: Secondary | ICD-10-CM

## 2019-11-19 DIAGNOSIS — F17210 Nicotine dependence, cigarettes, uncomplicated: Secondary | ICD-10-CM

## 2019-11-19 DIAGNOSIS — F1994 Other psychoactive substance use, unspecified with psychoactive substance-induced mood disorder: Secondary | ICD-10-CM

## 2019-11-19 MED ORDER — ESCITALOPRAM OXALATE 20 MG PO TABS: 20.0000 mg | ORAL_TABLET | Freq: Every day | ORAL | 1 refills | Status: DC

## 2019-11-19 NOTE — Progress Notes (Signed)
Virtual Visit via Video Note  I connected with Kevin Andrade on 11/19/19 at  4:00 PM EDT by a video enabled telemedicine application and verified that I am speaking with the correct person using two identifiers.   I discussed the limitations of evaluation and management by telemedicine and the availability of in person appointments. The patient expressed understanding and agreed to proceed.  History of Present Illness:    Observations/Objective:   Assessment and Plan:   Follow Up Instructions:    I discussed the assessment and treatment plan with the patient. The patient was provided an opportunity to ask questions and all were answered. The patient agreed with the plan and demonstrated an understanding of the instructions.   The patient was advised to call back or seek an in-person evaluation if the symptoms worsen or if the condition fails to improve as anticipated.  I provided 15 minutes of non-face-to-face time during this encounter.   [name], PA-C
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
## 2019-11-21 ENCOUNTER — Encounter (HOSPITAL_COMMUNITY): Payer: Self-pay | Admitting: Medical

## 2019-11-24 ENCOUNTER — Ambulatory Visit (HOSPITAL_COMMUNITY): Payer: BC Managed Care – PPO | Admitting: Licensed Clinical Social Worker

## 2019-11-24 ENCOUNTER — Telehealth (HOSPITAL_COMMUNITY): Payer: Self-pay | Admitting: Licensed Clinical Social Worker

## 2019-11-24 ENCOUNTER — Other Ambulatory Visit: Payer: Self-pay

## 2019-11-24 NOTE — Telephone Encounter (Signed)
Pt did  not present for webex therapy appointment. Sent email reminder and called patient. He did not join. Pacen Watford, lcas

## 2019-11-25 ENCOUNTER — Encounter (HOSPITAL_COMMUNITY): Payer: Self-pay | Admitting: Licensed Clinical Social Worker

## 2019-11-25 ENCOUNTER — Ambulatory Visit (INDEPENDENT_AMBULATORY_CARE_PROVIDER_SITE_OTHER): Payer: BC Managed Care – PPO | Admitting: Licensed Clinical Social Worker

## 2019-11-25 ENCOUNTER — Other Ambulatory Visit: Payer: Self-pay

## 2019-11-25 DIAGNOSIS — F1590 Other stimulant use, unspecified, uncomplicated: Secondary | ICD-10-CM

## 2019-11-25 DIAGNOSIS — F1121 Opioid dependence, in remission: Secondary | ICD-10-CM | POA: Diagnosis not present

## 2019-11-25 DIAGNOSIS — F411 Generalized anxiety disorder: Secondary | ICD-10-CM

## 2019-11-25 DIAGNOSIS — F329 Major depressive disorder, single episode, unspecified: Secondary | ICD-10-CM

## 2019-11-25 DIAGNOSIS — F32A Depression, unspecified: Secondary | ICD-10-CM

## 2019-11-25 NOTE — Progress Notes (Signed)
Virtual Visit via Video Note  I connected with Kevin Andrade on 11/25/19 at 10:00 AM EDT by a video enabled telemedicine application and verified that I am speaking with the correct person using two identifiers.   I discussed the limitations of evaluation and management by telemedicine and the availability of in person appointments. The patient expressed understanding and agreed to proceed.  History of Present Illness: Patient is referred for individual therapy after completion of CD-IOP for Opiod Use Disorder and hx of polysubstance abuse disorder, anxiety, depression. (Kratom & opiate pills)   Observations/Objective: Patient presents anxious for his individual therapy appointment via Webex. Patient discussed his psychiatric symptoms and current life events. Patient reports "I'm tired and stressed." Patient described his current stressors and coping skills. Patient is working on step 1 with his sponsor. "I'm still struggling to admit I'm an addict." Explored Step 1 with patient, being powerless. Validated his feelings.   Plan: shame   Follow Up Instructions: Counselor will send webex request for weekly sessions. I discussed the assessment and treatment plan with the patient. The patient was provided an opportunity to ask questions and all were answered. The patient agreed with the plan and demonstrated an understanding of the instructions.   The patient was advised to call back or seek an in-person evaluation if the symptoms worsen or if the condition fails to improve as anticipated.  I provided 60 minutes of non-face-to-face time during this encounter.   Nautica Hotz S, LCAS

## 2019-12-01 ENCOUNTER — Ambulatory Visit (INDEPENDENT_AMBULATORY_CARE_PROVIDER_SITE_OTHER): Payer: BC Managed Care – PPO | Admitting: Licensed Clinical Social Worker

## 2019-12-01 ENCOUNTER — Encounter (HOSPITAL_COMMUNITY): Payer: Self-pay | Admitting: Licensed Clinical Social Worker

## 2019-12-01 ENCOUNTER — Other Ambulatory Visit: Payer: Self-pay

## 2019-12-01 DIAGNOSIS — F411 Generalized anxiety disorder: Secondary | ICD-10-CM

## 2019-12-01 DIAGNOSIS — F1121 Opioid dependence, in remission: Secondary | ICD-10-CM

## 2019-12-01 DIAGNOSIS — F329 Major depressive disorder, single episode, unspecified: Secondary | ICD-10-CM | POA: Diagnosis not present

## 2019-12-01 DIAGNOSIS — F32A Depression, unspecified: Secondary | ICD-10-CM

## 2019-12-01 DIAGNOSIS — F1590 Other stimulant use, unspecified, uncomplicated: Secondary | ICD-10-CM

## 2019-12-01 NOTE — Progress Notes (Signed)
Virtual Visit via Video Note  I connected with Kevin Andrade on 12/01/19 at  9:00 AM EDT by a video enabled telemedicine application and verified that I am speaking with the correct person using two identifiers.   I discussed the limitations of evaluation and management by telemedicine and the availability of in person appointments. The patient expressed understanding and agreed to proceed.  History of Present Illness: Patient is referred for individual therapy after completion of CD-IOP for Opiod Use Disorder and hx of polysubstance abuse disorder, anxiety, depression. (Kratom & opiate pills)   Observations/Objective: Patient presents sick for his individual therapy appointment via Webex. Patient discussed his psychiatric symptoms and current life events. Patient has a cold or allergies, so was a little subdued. Patient discussed his recovery. Patient continues to work on step 1. "I'm beginning to believe I'm an addict." Discussed powerlessness with patient. Clinician utilized MI OARS to reflect and summarize thoughts and feelings. Discussed "Addiction is a shame based disease" where patient discussed his personal shame.    Follow Up Instructions: Counselor will send webex request for weekly sessions. I discussed the assessment and treatment plan with the patient. The patient was provided an opportunity to ask questions and all were answered. The patient agreed with the plan and demonstrated an understanding of the instructions.   The patient was advised to call back or seek an in-person evaluation if the symptoms worsen or if the condition fails to improve as anticipated.  I provided 60 minutes of non-face-to-face time during this encounter.   Shiasia Porro S, LCAS

## 2019-12-08 ENCOUNTER — Other Ambulatory Visit: Payer: Self-pay

## 2019-12-08 ENCOUNTER — Ambulatory Visit (INDEPENDENT_AMBULATORY_CARE_PROVIDER_SITE_OTHER): Payer: BC Managed Care – PPO | Admitting: Licensed Clinical Social Worker

## 2019-12-08 ENCOUNTER — Encounter (HOSPITAL_COMMUNITY): Payer: Self-pay | Admitting: Licensed Clinical Social Worker

## 2019-12-08 DIAGNOSIS — F1590 Other stimulant use, unspecified, uncomplicated: Secondary | ICD-10-CM | POA: Diagnosis not present

## 2019-12-08 DIAGNOSIS — F1121 Opioid dependence, in remission: Secondary | ICD-10-CM

## 2019-12-08 DIAGNOSIS — F411 Generalized anxiety disorder: Secondary | ICD-10-CM

## 2019-12-08 DIAGNOSIS — F329 Major depressive disorder, single episode, unspecified: Secondary | ICD-10-CM | POA: Diagnosis not present

## 2019-12-08 DIAGNOSIS — F32A Depression, unspecified: Secondary | ICD-10-CM

## 2019-12-08 NOTE — Progress Notes (Signed)
Virtual Visit via Video Note  I connected with Kevin Andrade on 12/08/19 at  9:00 AM EDT by a video enabled telemedicine application and verified that I am speaking with the correct person using two identifiers.   I discussed the limitations of evaluation and management by telemedicine and the availability of in person appointments. The patient expressed understanding and agreed to proceed.  History of Present Illness: Patient is referred for individual therapy after completion of CD-IOP for Opiod Use Disorder and hx of polysubstance abuse disorder, anxiety, depression. (Kratom & opiate pills)   Observations/Objective: Patient presents anxious for his individual therapy appointment via Webex. Patient discussed his psychiatric symptoms and current life events. Patient reports "I had thoughts and cravings this morning to use." Helped patient "play the tape all the way through." Patient identified his trigger this morning, "I was tired." Patient then identified his coping skill - diversion. Provided psychoeducation on thoughts, cravings, triggers, coping skills. Discussed with patient "living in a silo," and encouraged him to widen his relationship circle. Cln suggested ways for patient widen his circle.      Follow Up Instructions: Counselor will send webex request for weekly sessions. I discussed the assessment and treatment plan with the patient. The patient was provided an opportunity to ask questions and all were answered. The patient agreed with the plan and demonstrated an understanding of the instructions.   The patient was advised to call back or seek an in-person evaluation if the symptoms worsen or if the condition fails to improve as anticipated.  I provided 60 minutes of non-face-to-face time during this encounter.   Trinty Marken S, LCAS

## 2019-12-15 ENCOUNTER — Other Ambulatory Visit: Payer: Self-pay

## 2019-12-15 ENCOUNTER — Encounter (HOSPITAL_COMMUNITY): Payer: Self-pay | Admitting: Licensed Clinical Social Worker

## 2019-12-15 ENCOUNTER — Ambulatory Visit (INDEPENDENT_AMBULATORY_CARE_PROVIDER_SITE_OTHER): Payer: BC Managed Care – PPO | Admitting: Licensed Clinical Social Worker

## 2019-12-15 DIAGNOSIS — F329 Major depressive disorder, single episode, unspecified: Secondary | ICD-10-CM

## 2019-12-15 DIAGNOSIS — F411 Generalized anxiety disorder: Secondary | ICD-10-CM | POA: Diagnosis not present

## 2019-12-15 DIAGNOSIS — F32A Depression, unspecified: Secondary | ICD-10-CM

## 2019-12-15 DIAGNOSIS — F1590 Other stimulant use, unspecified, uncomplicated: Secondary | ICD-10-CM

## 2019-12-15 DIAGNOSIS — F1121 Opioid dependence, in remission: Secondary | ICD-10-CM

## 2019-12-15 NOTE — Progress Notes (Signed)
Virtual Visit via Video Note  I connected with Kevin Andrade on 12/15/19 at  9:00 AM EDT by a video enabled telemedicine application and verified that I am speaking with the correct person using two identifiers.   I discussed the limitations of evaluation and management by telemedicine and the availability of in person appointments. The patient expressed understanding and agreed to proceed.  History of Present Illness: Patient is referred for individual therapy after completion of CD-IOP for Opiod Use Disorder and hx of polysubstance abuse disorder, anxiety, depression. (Kratom & opiate pills)   Observations/Objective: Patient presents anxious for his individual therapy appointment via Webex. Patient discussed his psychiatric symptoms and current life events. Patient reports "I had thoughts of using all weekend. I had to help my father-in-law all weekend and I was tired." Cln provided psychoeducation on cravings vs thoughts. Helped pt make a different plan when he gets a trigger "being tired."  Patient did not meet with his sponsor this weekend. Discussed the importance of meeting weekly with his sponsor. Again, discussed "living in a silo." As suggested, patient did go out for breakfast at a diner near his house. Patient described his feelings. Validated his feelings.    Follow Up Instructions: Counselor will send webex request for weekly sessions. I discussed the assessment and treatment plan with the patient. The patient was provided an opportunity to ask questions and all were answered. The patient agreed with the plan and demonstrated an understanding of the instructions.   The patient was advised to call back or seek an in-person evaluation if the symptoms worsen or if the condition fails to improve as anticipated.  I provided 60 minutes of non-face-to-face time during this encounter.   Cincere Deprey S, LCAS

## 2019-12-22 ENCOUNTER — Other Ambulatory Visit: Payer: Self-pay

## 2019-12-22 ENCOUNTER — Ambulatory Visit (INDEPENDENT_AMBULATORY_CARE_PROVIDER_SITE_OTHER): Payer: BC Managed Care – PPO | Admitting: Licensed Clinical Social Worker

## 2019-12-22 ENCOUNTER — Encounter (HOSPITAL_COMMUNITY): Payer: Self-pay | Admitting: Licensed Clinical Social Worker

## 2019-12-22 DIAGNOSIS — F1121 Opioid dependence, in remission: Secondary | ICD-10-CM

## 2019-12-22 DIAGNOSIS — F329 Major depressive disorder, single episode, unspecified: Secondary | ICD-10-CM | POA: Diagnosis not present

## 2019-12-22 DIAGNOSIS — F411 Generalized anxiety disorder: Secondary | ICD-10-CM

## 2019-12-22 DIAGNOSIS — F32A Depression, unspecified: Secondary | ICD-10-CM

## 2019-12-22 DIAGNOSIS — F1590 Other stimulant use, unspecified, uncomplicated: Secondary | ICD-10-CM

## 2019-12-22 NOTE — Progress Notes (Signed)
Virtual Visit via Video Note  I connected with Kevin Andrade on 12/22/19 at  9:00 AM EDT by a video enabled telemedicine application and verified that I am speaking with the correct person using two identifiers.   I discussed the limitations of evaluation and management by telemedicine and the availability of in person appointments. The patient expressed understanding and agreed to proceed.  History of Present Illness: Patient is referred for individual therapy after completion of CD-IOP for Opiod Use Disorder and hx of polysubstance abuse disorder, anxiety, depression. (Kratom & opiate pills)   Observations/Objective: Patient presents anxious for his individual therapy appointment via Webex. Patient discussed his psychiatric symptoms and current life events. Patient discussed his recovery plans, meetings, sponsor, steps. Patient discussed relationship with inlaws, where he is always there to help, putting himself last. Discussed the importance of putting himself first, especially in early recovery. Provided psychoeducation on NA Home Group and Recovery Chips. Suggested patient pick up his 6 month chip.  As suggested, patient continues to go for breakfast at a diner near his house. Patient described his feelings of being more comfortable the more he goes. Validated his feelings.    Follow Up Instructions: Counselor will send webex request for weekly sessions. I discussed the assessment and treatment plan with the patient. The patient was provided an opportunity to ask questions and all were answered. The patient agreed with the plan and demonstrated an understanding of the instructions.   The patient was advised to call back or seek an in-person evaluation if the symptoms worsen or if the condition fails to improve as anticipated.  I provided 60 minutes of non-face-to-face time during this encounter.   Sheron Tallman S, LCAS

## 2019-12-29 ENCOUNTER — Other Ambulatory Visit: Payer: Self-pay

## 2019-12-29 ENCOUNTER — Ambulatory Visit (INDEPENDENT_AMBULATORY_CARE_PROVIDER_SITE_OTHER): Payer: BC Managed Care – PPO | Admitting: Licensed Clinical Social Worker

## 2019-12-29 ENCOUNTER — Encounter (HOSPITAL_COMMUNITY): Payer: Self-pay | Admitting: Licensed Clinical Social Worker

## 2019-12-29 DIAGNOSIS — F411 Generalized anxiety disorder: Secondary | ICD-10-CM | POA: Diagnosis not present

## 2019-12-29 DIAGNOSIS — F1590 Other stimulant use, unspecified, uncomplicated: Secondary | ICD-10-CM | POA: Diagnosis not present

## 2019-12-29 DIAGNOSIS — F1121 Opioid dependence, in remission: Secondary | ICD-10-CM | POA: Diagnosis not present

## 2019-12-29 DIAGNOSIS — F32A Depression, unspecified: Secondary | ICD-10-CM

## 2019-12-29 DIAGNOSIS — F329 Major depressive disorder, single episode, unspecified: Secondary | ICD-10-CM | POA: Diagnosis not present

## 2019-12-29 NOTE — Progress Notes (Signed)
Virtual Visit via Video Note  I connected with Kevin Andrade on 12/29/19 at  9:00 AM EDT by a video enabled telemedicine application and verified that I am speaking with the correct person using two identifiers.   I discussed the limitations of evaluation and management by telemedicine and the availability of in person appointments. The patient expressed understanding and agreed to proceed.  History of Present Illness: Patient is referred for individual therapy after completion of CD-IOP for Opiod Use Disorder and hx of polysubstance abuse disorder, anxiety, depression. (Kratom & opiate pills)   Observations/Objective: Patient presents anxious for his individual therapy appointment via Webex. Patient discussed his psychiatric symptoms and current life events. Reviewed tx plan with patient who verbalized acceptance of the plan. Patient discussed his recovery plans, meetings, sponsor, steps. Patient is working on step 2. Cln provided psychoeducation on higher power. Explored with patient step 1, powerlessness and being an admitting to being an addict.     Follow Up Instructions: Counselor will send webex request for weekly sessions. I discussed the assessment and treatment plan with the patient. The patient was provided an opportunity to ask questions and all were answered. The patient agreed with the plan and demonstrated an understanding of the instructions.   The patient was advised to call back or seek an in-person evaluation if the symptoms worsen or if the condition fails to improve as anticipated.  I provided 60 minutes of non-face-to-face time during this encounter.   Sheilah Rayos S, LCAS

## 2019-12-31 ENCOUNTER — Telehealth (INDEPENDENT_AMBULATORY_CARE_PROVIDER_SITE_OTHER): Payer: BC Managed Care – PPO | Admitting: Medical

## 2019-12-31 ENCOUNTER — Encounter (HOSPITAL_COMMUNITY): Payer: Self-pay | Admitting: Medical

## 2019-12-31 ENCOUNTER — Other Ambulatory Visit (HOSPITAL_COMMUNITY): Payer: Self-pay

## 2019-12-31 DIAGNOSIS — F17210 Nicotine dependence, cigarettes, uncomplicated: Secondary | ICD-10-CM

## 2019-12-31 DIAGNOSIS — F1590 Other stimulant use, unspecified, uncomplicated: Secondary | ICD-10-CM

## 2019-12-31 DIAGNOSIS — F4312 Post-traumatic stress disorder, chronic: Secondary | ICD-10-CM

## 2019-12-31 DIAGNOSIS — F1421 Cocaine dependence, in remission: Secondary | ICD-10-CM

## 2019-12-31 DIAGNOSIS — F419 Anxiety disorder, unspecified: Secondary | ICD-10-CM

## 2019-12-31 DIAGNOSIS — F17200 Nicotine dependence, unspecified, uncomplicated: Secondary | ICD-10-CM

## 2019-12-31 DIAGNOSIS — Z6372 Alcoholism and drug addiction in family: Secondary | ICD-10-CM

## 2019-12-31 DIAGNOSIS — F1121 Opioid dependence, in remission: Secondary | ICD-10-CM | POA: Diagnosis not present

## 2019-12-31 DIAGNOSIS — E669 Obesity, unspecified: Secondary | ICD-10-CM

## 2019-12-31 DIAGNOSIS — F1911 Other psychoactive substance abuse, in remission: Secondary | ICD-10-CM | POA: Diagnosis not present

## 2019-12-31 DIAGNOSIS — F3181 Bipolar II disorder: Secondary | ICD-10-CM

## 2019-12-31 DIAGNOSIS — T7492XS Unspecified child maltreatment, confirmed, sequela: Secondary | ICD-10-CM

## 2019-12-31 MED ORDER — ESCITALOPRAM OXALATE 20 MG PO TABS: 20.0000 mg | ORAL_TABLET | Freq: Every day | ORAL | 1 refills | Status: DC

## 2019-12-31 MED ORDER — PREGABALIN 150 MG PO CAPS: 150.0000 mg | ORAL_CAPSULE | Freq: Three times a day (TID) | ORAL | 2 refills | Status: DC
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
## 2020-01-05 ENCOUNTER — Ambulatory Visit (HOSPITAL_COMMUNITY): Payer: BC Managed Care – PPO | Admitting: Licensed Clinical Social Worker

## 2020-01-05 ENCOUNTER — Other Ambulatory Visit: Payer: Self-pay

## 2020-01-06 ENCOUNTER — Encounter (HOSPITAL_COMMUNITY): Payer: Self-pay | Admitting: Licensed Clinical Social Worker

## 2020-01-06 ENCOUNTER — Ambulatory Visit (INDEPENDENT_AMBULATORY_CARE_PROVIDER_SITE_OTHER): Payer: BC Managed Care – PPO | Admitting: Licensed Clinical Social Worker

## 2020-01-06 ENCOUNTER — Other Ambulatory Visit: Payer: Self-pay

## 2020-01-06 DIAGNOSIS — F1590 Other stimulant use, unspecified, uncomplicated: Secondary | ICD-10-CM

## 2020-01-06 DIAGNOSIS — F419 Anxiety disorder, unspecified: Secondary | ICD-10-CM

## 2020-01-06 DIAGNOSIS — F329 Major depressive disorder, single episode, unspecified: Secondary | ICD-10-CM

## 2020-01-06 DIAGNOSIS — F1121 Opioid dependence, in remission: Secondary | ICD-10-CM | POA: Diagnosis not present

## 2020-01-06 DIAGNOSIS — F32A Depression, unspecified: Secondary | ICD-10-CM

## 2020-01-06 NOTE — Progress Notes (Signed)
Virtual Visit via Video Note  I connected with Kevin Andrade on 01/06/20 at 10:00 AM EDT by a video enabled telemedicine application and verified that I am speaking with the correct person using two identifiers.   I discussed the limitations of evaluation and management by telemedicine and the availability of in person appointments. The patient expressed understanding and agreed to proceed.  History of Present Illness: Patient is referred for individual therapy after completion of CD-IOP for Opiod Use Disorder and hx of polysubstance abuse disorder, anxiety, depression. (Kratom & opiate pills)   Observations/Objective: Patient presents anxious for his virtual individual therapy appointment. Patient discussed his psychiatric symptoms and current life events. Patient reports his feelings of sadness due to the death of his dog. Provided patient a platform to talk about his feelings and relationship with his dog. Pt reports he did not want to use behind the death. Cln provided psychoeducation on grief. Patient discussed his appointment with PA Kevin Andrade, who kept his medications the same.Patient discussed his recovery plans, meetings, sponsor, step 2. Used MI OARS to assist patient with his thoughts and cravings to use.    Follow Up Instructions: Counselor will send webex request for weekly sessions. I discussed the assessment and treatment plan with the patient. The patient was provided an opportunity to ask questions and all were answered. The patient agreed with the plan and demonstrated an understanding of the instructions.   The patient was advised to call back or seek an in-person evaluation if the symptoms worsen or if the condition fails to improve as anticipated.  I provided 45 minutes of non-face-to-face time during this encounter.   Purl Claytor S, LCAS

## 2020-01-10 ENCOUNTER — Other Ambulatory Visit (HOSPITAL_COMMUNITY): Payer: Self-pay | Admitting: Medical

## 2020-01-12 ENCOUNTER — Other Ambulatory Visit: Payer: Self-pay

## 2020-01-12 ENCOUNTER — Encounter (HOSPITAL_COMMUNITY): Payer: Self-pay | Admitting: Licensed Clinical Social Worker

## 2020-01-12 ENCOUNTER — Ambulatory Visit (INDEPENDENT_AMBULATORY_CARE_PROVIDER_SITE_OTHER): Payer: BC Managed Care – PPO | Admitting: Licensed Clinical Social Worker

## 2020-01-12 DIAGNOSIS — F419 Anxiety disorder, unspecified: Secondary | ICD-10-CM

## 2020-01-12 DIAGNOSIS — F1121 Opioid dependence, in remission: Secondary | ICD-10-CM | POA: Diagnosis not present

## 2020-01-12 DIAGNOSIS — F1590 Other stimulant use, unspecified, uncomplicated: Secondary | ICD-10-CM | POA: Diagnosis not present

## 2020-01-12 DIAGNOSIS — F329 Major depressive disorder, single episode, unspecified: Secondary | ICD-10-CM | POA: Diagnosis not present

## 2020-01-12 DIAGNOSIS — F32A Depression, unspecified: Secondary | ICD-10-CM

## 2020-01-12 NOTE — Progress Notes (Signed)
Virtual Visit via Video Note  I connected with Kevin Andrade [facility] on 01/12/20 at  9:00 AM EDT by a video enabled telemedicine application and verified that I am speaking with the correct person using two identifiers.   I discussed the limitations of evaluation and management by telemedicine and the availability of in person appointments. The patient expressed understanding and agreed to proceed.  History of Present Illness: Patient is referred for individual therapy after completion of CD-IOP for Opiod Use Disorder and hx of polysubstance abuse disorder, anxiety, depression. (Kratom & opiate pills)   Observations/Objective: Patient presents anxious for his virtual individual therapy appointment. Patient discussed his psychiatric symptoms and current life events. Patient discussed his recovery plans, meetings, sponsor, step 2. Cln provided psychoeducation on Step 2. Patient reports he is still struggling with step 1, "being an addict, going to meetings for the rest of my life, meeting with sponsor forever." Clinician utilized MI OARS to affirm his concerns. Clinician challenged his thoughts and suggested he talk to his sponsor. Clinician processed options for communicating his concerns. Discussed with patient his concerns about Step 1.    Follow Up Instructions: I discussed the assessment and treatment plan with the patient. The patient was provided an opportunity to ask questions and all were answered. The patient agreed with the plan and demonstrated an understanding of the instructions.   The patient was advised to call back or seek an in-person evaluation if the symptoms worsen or if the condition fails to improve as anticipated.  I provided 60 minutes of non-face-to-face time during this encounter.   Laparis Durrett S, LCAS

## 2020-01-18 ENCOUNTER — Telehealth (HOSPITAL_COMMUNITY): Payer: Self-pay | Admitting: Medical

## 2020-01-18 ENCOUNTER — Other Ambulatory Visit (HOSPITAL_COMMUNITY): Payer: Self-pay | Admitting: Medical

## 2020-01-18 MED ORDER — ARIPIPRAZOLE 10 MG PO TABS: 10.0000 mg | ORAL_TABLET | Freq: Every day | ORAL | 0 refills | Status: DC

## 2020-01-18 NOTE — Telephone Encounter (Signed)
Lm informing patient.  Nothing Further Needed at this time.

## 2020-01-18 NOTE — Telephone Encounter (Signed)
Refill sent.

## 2020-01-18 NOTE — Telephone Encounter (Signed)
Pt needs refill on abilify  [facility] [place]  CB [phone]

## 2020-01-18 NOTE — Progress Notes (Signed)
RX refill request Abilify done

## 2020-01-19 ENCOUNTER — Other Ambulatory Visit: Payer: Self-pay

## 2020-01-19 ENCOUNTER — Ambulatory Visit (INDEPENDENT_AMBULATORY_CARE_PROVIDER_SITE_OTHER): Payer: BC Managed Care – PPO | Admitting: Licensed Clinical Social Worker

## 2020-01-19 ENCOUNTER — Encounter (HOSPITAL_COMMUNITY): Payer: Self-pay | Admitting: Licensed Clinical Social Worker

## 2020-01-19 DIAGNOSIS — F419 Anxiety disorder, unspecified: Secondary | ICD-10-CM | POA: Diagnosis not present

## 2020-01-19 DIAGNOSIS — F1590 Other stimulant use, unspecified, uncomplicated: Secondary | ICD-10-CM | POA: Diagnosis not present

## 2020-01-19 DIAGNOSIS — F32A Depression, unspecified: Secondary | ICD-10-CM

## 2020-01-19 DIAGNOSIS — F329 Major depressive disorder, single episode, unspecified: Secondary | ICD-10-CM | POA: Diagnosis not present

## 2020-01-19 DIAGNOSIS — F1121 Opioid dependence, in remission: Secondary | ICD-10-CM

## 2020-01-19 NOTE — Progress Notes (Signed)
Virtual Visit via Video Note  I connected with Kevin Andrade on 01/19/20 at  9:00 AM EDT by a video enabled telemedicine application and verified that I am speaking with the correct person using two identifiers.   I discussed the limitations of evaluation and management by telemedicine and the availability of in person appointments. The patient expressed understanding and agreed to proceed.  LOCATION: Patient: Home Provider: Home  History of Present Illness: Patient is referred for individual therapy after completion of CD-IOP for Opiod Use Disorder and hx of polysubstance abuse disorder, anxiety, depression. (Kratom & opiate pills)   Observations/Objective: Patient presents anxious and agitated for his virtual individual therapy appointment. Patient discussed his psychiatric symptoms and current life events. Patient discussed his recovery plans, meetings, sponsor, step 2. Patient reports "Lavenia Atlas been agitated for the past week." Used Socratic questions. Patient has been out of his Abilify for a week. Called [place] office to follow up on medication refills. In session pt called pharmacy and the refill is there. Patient'Andrade sponsor has been sick and "im agitated about that." cln provided psychoeducation on sponsorship, grand sponsor, calling other addicts. "I'm going back to step 1, I'm still stuck on being called an addict." Provided a platform for patient to talk about his feelings. Clinician utilized MI OARS to reflect and summarize thoughts and feelings    Follow Up Instructions: I discussed the assessment and treatment plan with the patient. The patient was provided an opportunity to ask questions and all were answered. The patient agreed with the plan and demonstrated an understanding of the instructions.   The patient was advised to call back or seek an in-person evaluation if the symptoms worsen or if the condition fails to improve as anticipated.  I provided 60 minutes of  non-face-to-face time during this encounter.   Kevin Andrade, LCAS

## 2020-01-26 ENCOUNTER — Ambulatory Visit (INDEPENDENT_AMBULATORY_CARE_PROVIDER_SITE_OTHER): Payer: BC Managed Care – PPO | Admitting: Licensed Clinical Social Worker

## 2020-01-26 ENCOUNTER — Encounter (HOSPITAL_COMMUNITY): Payer: Self-pay | Admitting: Licensed Clinical Social Worker

## 2020-01-26 ENCOUNTER — Other Ambulatory Visit: Payer: Self-pay

## 2020-01-26 DIAGNOSIS — F32A Depression, unspecified: Secondary | ICD-10-CM

## 2020-01-26 DIAGNOSIS — F1121 Opioid dependence, in remission: Secondary | ICD-10-CM | POA: Diagnosis not present

## 2020-01-26 DIAGNOSIS — F1590 Other stimulant use, unspecified, uncomplicated: Secondary | ICD-10-CM

## 2020-01-26 DIAGNOSIS — F419 Anxiety disorder, unspecified: Secondary | ICD-10-CM | POA: Diagnosis not present

## 2020-01-26 DIAGNOSIS — F329 Major depressive disorder, single episode, unspecified: Secondary | ICD-10-CM | POA: Diagnosis not present

## 2020-01-26 NOTE — Progress Notes (Signed)
Virtual Visit via Video Note  I connected with Kevin Andrade on 01/26/20 at  9:00 AM EDT by a video enabled telemedicine application and verified that I am speaking with the correct person using two identifiers.   I discussed the limitations of evaluation and management by telemedicine and the availability of in person appointments. The patient expressed understanding and agreed to proceed.  LOCATION: Patient: Home Provider: Home  History of Present Illness: Patient is referred for individual therapy after completion of CD-IOP for Opiod Use Disorder and hx of polysubstance abuse disorder, anxiety, depression. (Kratom & opiate pills)   Observations/Objective: Patient presents WNL for his virtual individual therapy appointment. Patient discussed his psychiatric symptoms and current life events. Patient discussed his recovery plans, meetings, sponsor, step 1. Cln provided psychoeducation on the different types of NA meetings (text study, step study, speaker, discussion). Suggested to patient to find different meeting types for experience. Upon suggestion, patient started calling other people in NA. Used socratic questions. Reminded patient to call and make appointment with [name]. Patient has gone back to his step 1 workbook and is starting it over. "I'm being more honest this time."  Will review at next session.  Follow Up Instructions: I discussed the assessment and treatment plan with the patient. The patient was provided an opportunity to ask questions and all were answered. The patient agreed with the plan and demonstrated an understanding of the instructions.   The patient was advised to call back or seek an in-person evaluation if the symptoms worsen or if the condition fails to improve as anticipated.  I provided 60 minutes of non-face-to-face time during this encounter.   Lorey Pallett S, LCAS

## 2020-01-28 ENCOUNTER — Encounter (HOSPITAL_COMMUNITY): Payer: Self-pay | Admitting: Medical

## 2020-01-28 ENCOUNTER — Telehealth (INDEPENDENT_AMBULATORY_CARE_PROVIDER_SITE_OTHER): Payer: BC Managed Care – PPO | Admitting: Medical

## 2020-01-28 DIAGNOSIS — F1421 Cocaine dependence, in remission: Secondary | ICD-10-CM

## 2020-01-28 DIAGNOSIS — T7492XS Unspecified child maltreatment, confirmed, sequela: Secondary | ICD-10-CM

## 2020-01-28 DIAGNOSIS — F1590 Other stimulant use, unspecified, uncomplicated: Secondary | ICD-10-CM

## 2020-01-28 DIAGNOSIS — Z6372 Alcoholism and drug addiction in family: Secondary | ICD-10-CM

## 2020-01-28 DIAGNOSIS — F341 Dysthymic disorder: Secondary | ICD-10-CM

## 2020-01-28 DIAGNOSIS — F1121 Opioid dependence, in remission: Secondary | ICD-10-CM | POA: Diagnosis not present

## 2020-01-28 DIAGNOSIS — F1911 Other psychoactive substance abuse, in remission: Secondary | ICD-10-CM

## 2020-01-28 DIAGNOSIS — F4312 Post-traumatic stress disorder, chronic: Secondary | ICD-10-CM

## 2020-01-28 DIAGNOSIS — F419 Anxiety disorder, unspecified: Secondary | ICD-10-CM

## 2020-01-28 MED ORDER — ESCITALOPRAM OXALATE 20 MG PO TABS: 20.0000 mg | ORAL_TABLET | Freq: Every day | ORAL | 1 refills | Status: DC
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
## 2020-02-01 ENCOUNTER — Encounter (HOSPITAL_COMMUNITY): Payer: Self-pay | Admitting: Licensed Clinical Social Worker

## 2020-02-01 ENCOUNTER — Other Ambulatory Visit: Payer: Self-pay

## 2020-02-01 ENCOUNTER — Ambulatory Visit (INDEPENDENT_AMBULATORY_CARE_PROVIDER_SITE_OTHER): Payer: BC Managed Care – PPO | Admitting: Licensed Clinical Social Worker

## 2020-02-01 DIAGNOSIS — F1121 Opioid dependence, in remission: Secondary | ICD-10-CM

## 2020-02-01 DIAGNOSIS — F1590 Other stimulant use, unspecified, uncomplicated: Secondary | ICD-10-CM

## 2020-02-01 DIAGNOSIS — F329 Major depressive disorder, single episode, unspecified: Secondary | ICD-10-CM | POA: Diagnosis not present

## 2020-02-01 DIAGNOSIS — F419 Anxiety disorder, unspecified: Secondary | ICD-10-CM

## 2020-02-01 DIAGNOSIS — F32A Depression, unspecified: Secondary | ICD-10-CM

## 2020-02-01 NOTE — Progress Notes (Signed)
Virtual Visit via Video Note  I connected with Kevin Andrade on 02/01/20 at  9:00 AM EDT by a video enabled telemedicine application and verified that I am speaking with the correct person using two identifiers.   I discussed the limitations of evaluation and management by telemedicine and the availability of in person appointments. The patient expressed understanding and agreed to proceed.  LOCATION: Patient: Home Provider: Home  History of Present Illness: Patient is referred for individual therapy after completion of CD-IOP for Opiod Use Disorder and hx of polysubstance abuse disorder, anxiety, depression. (Kratom & opiate pills)   Observations/Objective: Patient presents WNL for his virtual individual therapy appointment. Patient discussed his psychiatric symptoms and current life events. Patient discussed his recovery, meetings, sponsor, step work. In working on step 1 patient struggled with "disease of addicton." Cln provided psychoeducation on disease concept and chemical dependency vs addiction. Patient described his cravings this week and reaching out to other addicts. Patient had appointment with [name], PA, for medication management, no medications were changed. Began discussion of step 1, will continue at next session.  Follow Up Instructions: I discussed the assessment and treatment plan with the patient. The patient was provided an opportunity to ask questions and all were answered. The patient agreed with the plan and demonstrated an understanding of the instructions.   The patient was advised to call back or seek an in-person evaluation if the symptoms worsen or if the condition fails to improve as anticipated.  I provided 60 minutes of non-face-to-face time during this encounter.   Shanielle Correll S, LCAS

## 2020-02-08 ENCOUNTER — Other Ambulatory Visit: Payer: Self-pay

## 2020-02-08 ENCOUNTER — Encounter (HOSPITAL_COMMUNITY): Payer: Self-pay | Admitting: Licensed Clinical Social Worker

## 2020-02-08 ENCOUNTER — Ambulatory Visit (INDEPENDENT_AMBULATORY_CARE_PROVIDER_SITE_OTHER): Payer: BC Managed Care – PPO | Admitting: Licensed Clinical Social Worker

## 2020-02-08 DIAGNOSIS — F1590 Other stimulant use, unspecified, uncomplicated: Secondary | ICD-10-CM | POA: Diagnosis not present

## 2020-02-08 DIAGNOSIS — F329 Major depressive disorder, single episode, unspecified: Secondary | ICD-10-CM

## 2020-02-08 DIAGNOSIS — F1121 Opioid dependence, in remission: Secondary | ICD-10-CM | POA: Diagnosis not present

## 2020-02-08 DIAGNOSIS — F419 Anxiety disorder, unspecified: Secondary | ICD-10-CM | POA: Diagnosis not present

## 2020-02-08 DIAGNOSIS — F32A Depression, unspecified: Secondary | ICD-10-CM

## 2020-02-08 NOTE — Progress Notes (Signed)
Virtual Visit via Video Note  I connected with Kevin Andrade on 02/08/20 at  9:00 AM EDT by a video enabled telemedicine application and verified that I am speaking with the correct person using two identifiers.   I discussed the limitations of evaluation and management by telemedicine and the availability of in person appointments. The patient expressed understanding and agreed to proceed.  LOCATION: Patient: Home Provider: Home  History of Present Illness: Patient is referred for individual therapy after completion of CD-IOP for Opiod Use Disorder and hx of polysubstance abuse disorder, anxiety, depression. (Kratom & opiate pills)   Observations/Objective: Patient presents WNL for his virtual individual therapy appointment. Patient discussed his psychiatric symptoms and current life events. Patient discussed his recovery, meetings, sponsor, step work. Began review of  step 1 with patient. Patient discussed his feelings of frustration at work which previously was a trigger to use. "It'Andrade still a trigger." Clinician utilized CBT to address thought processes, identifying current triggers and coping skills.      Follow Up Instructions: I discussed the assessment and treatment plan with the patient. The patient was provided an opportunity to ask questions and all were answered. The patient agreed with the plan and demonstrated an understanding of the instructions.   The patient was advised to call back or seek an in-person evaluation if the symptoms worsen or if the condition fails to improve as anticipated.  I provided 60 minutes of non-face-to-face time during this encounter.   Kevin Andrade, LCAS

## 2020-02-10 NOTE — Progress Notes (Signed)
[facility] [place] My Chart visit

## 2020-02-15 ENCOUNTER — Ambulatory Visit (INDEPENDENT_AMBULATORY_CARE_PROVIDER_SITE_OTHER): Payer: BC Managed Care – PPO | Admitting: Licensed Clinical Social Worker

## 2020-02-15 ENCOUNTER — Other Ambulatory Visit: Payer: Self-pay

## 2020-02-15 ENCOUNTER — Encounter (HOSPITAL_COMMUNITY): Payer: Self-pay | Admitting: Licensed Clinical Social Worker

## 2020-02-15 DIAGNOSIS — F1590 Other stimulant use, unspecified, uncomplicated: Secondary | ICD-10-CM | POA: Diagnosis not present

## 2020-02-15 DIAGNOSIS — F32A Depression, unspecified: Secondary | ICD-10-CM

## 2020-02-15 DIAGNOSIS — F329 Major depressive disorder, single episode, unspecified: Secondary | ICD-10-CM | POA: Diagnosis not present

## 2020-02-15 DIAGNOSIS — F1121 Opioid dependence, in remission: Secondary | ICD-10-CM | POA: Diagnosis not present

## 2020-02-15 DIAGNOSIS — F419 Anxiety disorder, unspecified: Secondary | ICD-10-CM | POA: Diagnosis not present

## 2020-02-15 NOTE — Progress Notes (Signed)
Virtual Visit via Video Note  I connected with Kevin Andrade on 02/15/20 at  9:00 AM EDT by a video enabled telemedicine application and verified that I am speaking with the correct person using two identifiers.   I discussed the limitations of evaluation and management by telemedicine and the availability of in person appointments. The patient expressed understanding and agreed to proceed.  LOCATION: Patient: Home Provider: Home  History of Present Illness: Patient is referred for individual therapy after completion of CD-IOP for Opiod Use Disorder and hx of polysubstance abuse disorder, anxiety, depression. (Kratom & opiate pills)   Observations/Objective: Patient presents depressed and tearful for his virtual individual therapy appointment. Patient discussed his psychiatric symptoms and current life events. "I have been extremely depressed since last week with thoughts of suicide last week. I feel everyone would be better off without me." Discussed coping skills when depressed. Assisted patient identifying his triggers for depression. Cln provided psychoeducation on gratitude. Emailed patient gratitude journal to start using. Patient reports calling his sponsor and other ment in recovery during his most depressed state last week. Patient denies S/I or H/I today.     Follow Up Instructions: I discussed the assessment and treatment plan with the patient. The patient was provided an opportunity to ask questions and all were answered. The patient agreed with the plan and demonstrated an understanding of the instructions.   The patient was advised to call back or seek an in-person evaluation if the symptoms worsen or if the condition fails to improve as anticipated.  I provided 60 minutes of non-face-to-face time during this encounter.   Aaro Meyers S, LCAS

## 2020-02-18 ENCOUNTER — Encounter (HOSPITAL_COMMUNITY): Payer: Self-pay | Admitting: Medical

## 2020-02-18 ENCOUNTER — Telehealth (INDEPENDENT_AMBULATORY_CARE_PROVIDER_SITE_OTHER): Payer: BC Managed Care – PPO | Admitting: Medical

## 2020-02-18 DIAGNOSIS — Z6372 Alcoholism and drug addiction in family: Secondary | ICD-10-CM

## 2020-02-18 DIAGNOSIS — F17200 Nicotine dependence, unspecified, uncomplicated: Secondary | ICD-10-CM

## 2020-02-18 DIAGNOSIS — F1121 Opioid dependence, in remission: Secondary | ICD-10-CM

## 2020-02-18 DIAGNOSIS — F418 Other specified anxiety disorders: Secondary | ICD-10-CM | POA: Diagnosis not present

## 2020-02-18 DIAGNOSIS — E669 Obesity, unspecified: Secondary | ICD-10-CM

## 2020-02-18 DIAGNOSIS — F1421 Cocaine dependence, in remission: Secondary | ICD-10-CM

## 2020-02-18 DIAGNOSIS — F1590 Other stimulant use, unspecified, uncomplicated: Secondary | ICD-10-CM

## 2020-02-18 DIAGNOSIS — F4312 Post-traumatic stress disorder, chronic: Secondary | ICD-10-CM | POA: Diagnosis not present

## 2020-02-18 DIAGNOSIS — T7492XS Unspecified child maltreatment, confirmed, sequela: Secondary | ICD-10-CM

## 2020-02-18 MED ORDER — VORTIOXETINE HBR 20 MG PO TABS: ORAL_TABLET | ORAL | 2 refills | Status: DC
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
## 2020-02-19 ENCOUNTER — Other Ambulatory Visit (HOSPITAL_COMMUNITY): Payer: Self-pay | Admitting: *Deleted

## 2020-02-19 MED ORDER — ESCITALOPRAM OXALATE 20 MG PO TABS: 20.0000 mg | ORAL_TABLET | Freq: Every day | ORAL | 1 refills | Status: DC

## 2020-02-19 MED ORDER — ARIPIPRAZOLE 10 MG PO TABS: 10.0000 mg | ORAL_TABLET | Freq: Every day | ORAL | 0 refills | Status: DC

## 2020-02-22 ENCOUNTER — Ambulatory Visit (INDEPENDENT_AMBULATORY_CARE_PROVIDER_SITE_OTHER): Payer: BC Managed Care – PPO | Admitting: Licensed Clinical Social Worker

## 2020-02-22 ENCOUNTER — Other Ambulatory Visit: Payer: Self-pay

## 2020-02-22 ENCOUNTER — Encounter (HOSPITAL_COMMUNITY): Payer: Self-pay | Admitting: Licensed Clinical Social Worker

## 2020-02-22 ENCOUNTER — Telehealth (HOSPITAL_COMMUNITY): Payer: Self-pay | Admitting: Medical

## 2020-02-22 DIAGNOSIS — F329 Major depressive disorder, single episode, unspecified: Secondary | ICD-10-CM | POA: Diagnosis not present

## 2020-02-22 DIAGNOSIS — F1121 Opioid dependence, in remission: Secondary | ICD-10-CM | POA: Diagnosis not present

## 2020-02-22 DIAGNOSIS — F419 Anxiety disorder, unspecified: Secondary | ICD-10-CM

## 2020-02-22 DIAGNOSIS — F1590 Other stimulant use, unspecified, uncomplicated: Secondary | ICD-10-CM | POA: Diagnosis not present

## 2020-02-22 DIAGNOSIS — F32A Depression, unspecified: Secondary | ICD-10-CM

## 2020-02-22 NOTE — Progress Notes (Signed)
Virtual Visit via Video Note  I connected with Kevin Andrade on 02/22/20 at  9:00 AM EDT by a video enabled telemedicine application and verified that I am speaking with the correct person using two identifiers.   I discussed the limitations of evaluation and management by telemedicine and the availability of in person appointments. The patient expressed understanding and agreed to proceed.  LOCATION: Patient: Home Provider: Home  History of Present Illness: Patient is referred for individual therapy after completion of CD-IOP for Opiod Use Disorder and hx of polysubstance abuse disorder, anxiety, depression. (Kratom & opiate pills)   Observations/Objective: Patient presents depressed and tearful for his virtual individual therapy appointment. Patient discussed his psychiatric symptoms and current life events. "I continue to have suicidal thoughts. I had a session with Leonette Most last week and he stopped my abilify and Lexapro and added Trintelliz.  "I still feel everyone would be better off without me." Discussed meaning of statement. Discussed with patient depression coping skills is more than medication. Discussed the benefits of PHP and IOP programs with patient. Again, assisted patient identifying his triggers for depression.  Patient reports calling his sponsor and other men in recovery, but didn't go to any meetings. Discussed complacency in recovery. Patient will think about PHP.     PLAN: PHP referral?  PLAN: gratitude journal   Follow Up Instructions: I discussed the assessment and treatment plan with the patient. The patient was provided an opportunity to ask questions and all were answered. The patient agreed with the plan and demonstrated an understanding of the instructions.   The patient was advised to call back or seek an in-person evaluation if the symptoms worsen or if the condition fails to improve as anticipated.  I provided 45 minutes of non-face-to-face time during  this encounter.   [name], LCAS

## 2020-02-23 DIAGNOSIS — F4312 Post-traumatic stress disorder, chronic: Secondary | ICD-10-CM | POA: Diagnosis not present

## 2020-02-23 DIAGNOSIS — F1590 Other stimulant use, unspecified, uncomplicated: Secondary | ICD-10-CM | POA: Diagnosis not present

## 2020-02-23 DIAGNOSIS — F418 Other specified anxiety disorders: Secondary | ICD-10-CM | POA: Diagnosis not present

## 2020-03-03 ENCOUNTER — Telehealth (INDEPENDENT_AMBULATORY_CARE_PROVIDER_SITE_OTHER): Payer: BC Managed Care – PPO | Admitting: Medical

## 2020-03-03 ENCOUNTER — Encounter (HOSPITAL_COMMUNITY): Payer: Self-pay | Admitting: Medical

## 2020-03-03 DIAGNOSIS — F1590 Other stimulant use, unspecified, uncomplicated: Secondary | ICD-10-CM

## 2020-03-03 DIAGNOSIS — F1121 Opioid dependence, in remission: Secondary | ICD-10-CM

## 2020-03-03 DIAGNOSIS — F1421 Cocaine dependence, in remission: Secondary | ICD-10-CM

## 2020-03-03 DIAGNOSIS — Z6372 Alcoholism and drug addiction in family: Secondary | ICD-10-CM

## 2020-03-03 DIAGNOSIS — F1021 Alcohol dependence, in remission: Secondary | ICD-10-CM

## 2020-03-03 DIAGNOSIS — F4312 Post-traumatic stress disorder, chronic: Secondary | ICD-10-CM

## 2020-03-03 DIAGNOSIS — T7492XS Unspecified child maltreatment, confirmed, sequela: Secondary | ICD-10-CM

## 2020-03-03 DIAGNOSIS — Z639 Problem related to primary support group, unspecified: Secondary | ICD-10-CM
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
## 2020-03-07 ENCOUNTER — Encounter (HOSPITAL_COMMUNITY): Payer: Self-pay | Admitting: Licensed Clinical Social Worker

## 2020-03-07 ENCOUNTER — Other Ambulatory Visit: Payer: Self-pay

## 2020-03-07 ENCOUNTER — Ambulatory Visit (INDEPENDENT_AMBULATORY_CARE_PROVIDER_SITE_OTHER): Payer: BC Managed Care – PPO | Admitting: Licensed Clinical Social Worker

## 2020-03-07 DIAGNOSIS — F1590 Other stimulant use, unspecified, uncomplicated: Secondary | ICD-10-CM | POA: Diagnosis not present

## 2020-03-07 DIAGNOSIS — F329 Major depressive disorder, single episode, unspecified: Secondary | ICD-10-CM | POA: Diagnosis not present

## 2020-03-07 DIAGNOSIS — F419 Anxiety disorder, unspecified: Secondary | ICD-10-CM | POA: Diagnosis not present

## 2020-03-07 DIAGNOSIS — F1121 Opioid dependence, in remission: Secondary | ICD-10-CM | POA: Diagnosis not present

## 2020-03-07 DIAGNOSIS — F32A Depression, unspecified: Secondary | ICD-10-CM

## 2020-03-07 NOTE — Progress Notes (Signed)
Virtual Visit via Video Note  I connected with Addiel Mccardle Tatsch on 03/07/20 at  9:00 AM EDT by a video enabled telemedicine application and verified that I am speaking with the correct person using two identifiers.   I discussed the limitations of evaluation and management by telemedicine and the availability of in person appointments. The patient expressed understanding and agreed to proceed.  LOCATION: Patient: Home Provider: Home  History of Present Illness: Patient is referred for individual therapy after completion of CD-IOP for Opiod Use Disorder and hx of polysubstance abuse disorder, anxiety, depression. (Kratom & opiate pills)   Observations/Objective: Patient presents WNL for his virtual individual therapy appointment. Patient discussed his psychiatric symptoms and current life events. Patient reports his moods are more stable however, he's been exhibiting some manic behaviors in the last few days. Used socratic questions. Suggested patient monitor his moods. Patient discussed his frustration with his sponsor. Cln provided psychoeducation on sponsor sponsee relationship and expectations. Reviewed step 1 with patient and discussed "I'm an addict." Patient is monitoring trintellex. Patient will see CEloisa Northern in 2 weeks. Patient is not interested in PHP at this time but will continue to address it as moods change.       PLAN: gratitude journal   Follow Up Instructions: I discussed the assessment and treatment plan with the patient. The patient was provided an opportunity to ask questions and all were answered. The patient agreed with the plan and demonstrated an understanding of the instructions.   The patient was advised to call back or seek an in-person evaluation if the symptoms worsen or if the condition fails to improve as anticipated.  I provided 60 minutes of non-face-to-face time during this encounter.   Paitlyn Mcclatchey S, LCAS

## 2020-03-14 ENCOUNTER — Ambulatory Visit (INDEPENDENT_AMBULATORY_CARE_PROVIDER_SITE_OTHER): Payer: BC Managed Care – PPO | Admitting: Licensed Clinical Social Worker

## 2020-03-14 ENCOUNTER — Encounter (HOSPITAL_COMMUNITY): Payer: Self-pay | Admitting: Licensed Clinical Social Worker

## 2020-03-14 ENCOUNTER — Other Ambulatory Visit: Payer: Self-pay

## 2020-03-14 DIAGNOSIS — F419 Anxiety disorder, unspecified: Secondary | ICD-10-CM

## 2020-03-14 DIAGNOSIS — F32A Depression, unspecified: Secondary | ICD-10-CM

## 2020-03-14 DIAGNOSIS — F1590 Other stimulant use, unspecified, uncomplicated: Secondary | ICD-10-CM | POA: Diagnosis not present

## 2020-03-14 DIAGNOSIS — F329 Major depressive disorder, single episode, unspecified: Secondary | ICD-10-CM | POA: Diagnosis not present

## 2020-03-14 DIAGNOSIS — F1121 Opioid dependence, in remission: Secondary | ICD-10-CM

## 2020-03-14 NOTE — Progress Notes (Signed)
Virtual Visit via Video Note  I connected with Kevin Andrade on 03/14/20 at  9:00 AM EDT by a video enabled telemedicine application and verified that I am speaking with the correct person using two identifiers.   I discussed the limitations of evaluation and management by telemedicine and the availability of in person appointments. The patient expressed understanding and agreed to proceed.  LOCATION: Patient: Home Provider: Home  History of Present Illness: Patient is referred for individual therapy after completion of CD-IOP for Opiod Use Disorder and hx of polysubstance abuse disorder, anxiety, depression. (Kratom & opiate pills)   Observations/Objective: Patient presents WNL for his virtual individual therapy appointment. Patient discussed his psychiatric symptoms and current life events. Patient reports his moods are more stable however, he's still exhibiting some manic behaviors. Used socratic questions. Patient discussed his frustration with his sponsor and had a discussion with his sponsor about his feelings about the lack of sponsorship. Again, Cln provided psychoeducation on sponsor/sponsee relationship and expectations. Encouraged patient to find in-person NA meetings. Researched area in-person NA meetings. Reviewed addict vs "in recovery" with patient. Patient reports the Trintellex seems to be working better in regulating his moods . Patient will see [name] this week for medication managment.        PLAN: gratitude journal   Follow Up Instructions: I discussed the assessment and treatment plan with the patient. The patient was provided an opportunity to ask questions and all were answered. The patient agreed with the plan and demonstrated an understanding of the instructions.   The patient was advised to call back or seek an in-person evaluation if the symptoms worsen or if the condition fails to improve as anticipated.  I provided 60 minutes of non-face-to-face time  during this encounter.   Connie Hilgert S, LCAS

## 2020-03-17 ENCOUNTER — Encounter (HOSPITAL_COMMUNITY): Payer: Self-pay | Admitting: Medical

## 2020-03-17 ENCOUNTER — Telehealth (INDEPENDENT_AMBULATORY_CARE_PROVIDER_SITE_OTHER): Payer: BC Managed Care – PPO | Admitting: Medical

## 2020-03-17 DIAGNOSIS — F1121 Opioid dependence, in remission: Secondary | ICD-10-CM

## 2020-03-17 DIAGNOSIS — F4312 Post-traumatic stress disorder, chronic: Secondary | ICD-10-CM | POA: Diagnosis not present

## 2020-03-17 DIAGNOSIS — Z639 Problem related to primary support group, unspecified: Secondary | ICD-10-CM

## 2020-03-17 DIAGNOSIS — F341 Dysthymic disorder: Secondary | ICD-10-CM | POA: Diagnosis not present

## 2020-03-17 DIAGNOSIS — F418 Other specified anxiety disorders: Secondary | ICD-10-CM

## 2020-03-17 DIAGNOSIS — E669 Obesity, unspecified: Secondary | ICD-10-CM

## 2020-03-17 DIAGNOSIS — T7492XS Unspecified child maltreatment, confirmed, sequela: Secondary | ICD-10-CM

## 2020-03-17 DIAGNOSIS — F1921 Other psychoactive substance dependence, in remission: Secondary | ICD-10-CM | POA: Diagnosis not present

## 2020-03-17 MED ORDER — PREGABALIN 150 MG PO CAPS: 150.0000 mg | ORAL_CAPSULE | Freq: Three times a day (TID) | ORAL | 2 refills | Status: DC

## 2020-03-17 MED ORDER — VORTIOXETINE HBR 20 MG PO TABS: ORAL_TABLET | ORAL | 2 refills | Status: DC
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
## 2020-03-21 ENCOUNTER — Encounter (HOSPITAL_COMMUNITY): Payer: Self-pay | Admitting: Licensed Clinical Social Worker

## 2020-03-21 ENCOUNTER — Other Ambulatory Visit: Payer: Self-pay

## 2020-03-21 ENCOUNTER — Ambulatory Visit (INDEPENDENT_AMBULATORY_CARE_PROVIDER_SITE_OTHER): Payer: BC Managed Care – PPO | Admitting: Licensed Clinical Social Worker

## 2020-03-21 DIAGNOSIS — F329 Major depressive disorder, single episode, unspecified: Secondary | ICD-10-CM | POA: Diagnosis not present

## 2020-03-21 DIAGNOSIS — F1590 Other stimulant use, unspecified, uncomplicated: Secondary | ICD-10-CM

## 2020-03-21 DIAGNOSIS — F1121 Opioid dependence, in remission: Secondary | ICD-10-CM | POA: Diagnosis not present

## 2020-03-21 DIAGNOSIS — F419 Anxiety disorder, unspecified: Secondary | ICD-10-CM

## 2020-03-21 DIAGNOSIS — F32A Depression, unspecified: Secondary | ICD-10-CM

## 2020-03-21 NOTE — Progress Notes (Signed)
Virtual Visit via Video Note  I connected with Kevin Andrade on 03/21/20 at  9:00 AM EDT by a video enabled telemedicine application and verified that I am speaking with the correct person using two identifiers.   I discussed the limitations of evaluation and management by telemedicine and the availability of in person appointments. The patient expressed understanding and agreed to proceed.  LOCATION: Patient: Home Provider: Home  History of Present Illness: Patient is referred for individual therapy after completion of CD-IOP for Opiod Use Disorder and hx of polysubstance abuse disorder, anxiety, depression. (Kratom & opiate pills)   Observations/Objective: Patient presents WNL for his virtual individual therapy appointment. Patient discussed his psychiatric symptoms and current life events. Patient reports his moods are more stable. Patient saw [name] for medication management & no medication has changed. Patient discussed the continued relationship with his sponsor. Used socratic questions. Cln explored with patient Disease of Addiction. Cln provided psychoeducation on disease of addiction. Patient discussed his meetings, sponsor and recovery. Cln questioned NA sobriety chips. Continue to review step 1 with patient.      PLAN: gratitude journal   Follow Up Instructions: I discussed the assessment and treatment plan with the patient. The patient was provided an opportunity to ask questions and all were answered. The patient agreed with the plan and demonstrated an understanding of the instructions.   The patient was advised to call back or seek an in-person evaluation if the symptoms worsen or if the condition fails to improve as anticipated.  I provided 60 minutes of non-face-to-face time during this encounter.   Darrious Youman S, LCAS

## 2020-04-04 ENCOUNTER — Ambulatory Visit (HOSPITAL_COMMUNITY): Payer: BC Managed Care – PPO | Admitting: Licensed Clinical Social Worker

## 2020-04-04 ENCOUNTER — Telehealth (HOSPITAL_COMMUNITY): Payer: Self-pay | Admitting: Licensed Clinical Social Worker

## 2020-04-04 ENCOUNTER — Other Ambulatory Visit: Payer: Self-pay

## 2020-04-04 NOTE — Telephone Encounter (Signed)
Pt did not present for his virtual therapy session. Called patient and left vm reminder of session. Pt did not join. Rasean Joos, lcas

## 2020-04-11 ENCOUNTER — Other Ambulatory Visit: Payer: Self-pay

## 2020-04-11 ENCOUNTER — Encounter (HOSPITAL_COMMUNITY): Payer: Self-pay | Admitting: Licensed Clinical Social Worker

## 2020-04-11 ENCOUNTER — Ambulatory Visit (INDEPENDENT_AMBULATORY_CARE_PROVIDER_SITE_OTHER): Payer: BC Managed Care – PPO | Admitting: Licensed Clinical Social Worker

## 2020-04-11 DIAGNOSIS — F1121 Opioid dependence, in remission: Secondary | ICD-10-CM

## 2020-04-11 DIAGNOSIS — F1590 Other stimulant use, unspecified, uncomplicated: Secondary | ICD-10-CM | POA: Diagnosis not present

## 2020-04-11 DIAGNOSIS — F419 Anxiety disorder, unspecified: Secondary | ICD-10-CM | POA: Diagnosis not present

## 2020-04-11 DIAGNOSIS — F32A Depression, unspecified: Secondary | ICD-10-CM

## 2020-04-11 DIAGNOSIS — F329 Major depressive disorder, single episode, unspecified: Secondary | ICD-10-CM
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
## 2020-04-18 ENCOUNTER — Encounter (HOSPITAL_COMMUNITY): Payer: Self-pay | Admitting: Licensed Clinical Social Worker

## 2020-04-18 ENCOUNTER — Ambulatory Visit (INDEPENDENT_AMBULATORY_CARE_PROVIDER_SITE_OTHER): Payer: BC Managed Care – PPO | Admitting: Licensed Clinical Social Worker

## 2020-04-18 ENCOUNTER — Other Ambulatory Visit: Payer: Self-pay

## 2020-04-18 DIAGNOSIS — F419 Anxiety disorder, unspecified: Secondary | ICD-10-CM | POA: Diagnosis not present

## 2020-04-18 DIAGNOSIS — F1121 Opioid dependence, in remission: Secondary | ICD-10-CM | POA: Diagnosis not present

## 2020-04-18 DIAGNOSIS — F32A Depression, unspecified: Secondary | ICD-10-CM

## 2020-04-18 DIAGNOSIS — F329 Major depressive disorder, single episode, unspecified: Secondary | ICD-10-CM | POA: Diagnosis not present

## 2020-04-18 DIAGNOSIS — F1590 Other stimulant use, unspecified, uncomplicated: Secondary | ICD-10-CM | POA: Diagnosis not present

## 2020-04-18 NOTE — Progress Notes (Signed)
Virtual Visit via Video Note  I connected with Kevin Andrade on 04/18/20 at  9:00 AM EDT by a video enabled telemedicine application and verified that I am speaking with the correct person using two identifiers.   I discussed the limitations of evaluation and management by telemedicine and the availability of in person appointments. The patient expressed understanding and agreed to proceed.  LOCATION: Patient: Home Provider: Home  History of Present Illness: Patient is referred for individual therapy after completion of CD-IOP for Opiod Use Disorder and hx of polysubstance abuse disorder, anxiety, depression. (Kratom & opiate pills)   Observations/Objective: Patient presents WNL for his virtual individual therapy appointment. Patient discussed his psychiatric symptoms and current life events. Patient reports his moods are more stable. Patient provides an update on his recovery. Explored with patient "I am an addict" and moving on to step 2. Patient explained his reasons for going to tx, how road to tx and now his road to recovery. Patient described how he moved from step 1 to step 2. Cln validated his feelings.           PLAN: gratitude journal   Follow Up Instructions: I discussed the assessment and treatment plan with the patient. The patient was provided an opportunity to ask questions and all were answered. The patient agreed with the plan and demonstrated an understanding of the instructions.   The patient was advised to call back or seek an in-person evaluation if the symptoms worsen or if the condition fails to improve as anticipated.  I provided 60 minutes of non-face-to-face time during this encounter.   Earleen Aoun S, LCAS

## 2020-04-25 ENCOUNTER — Other Ambulatory Visit: Payer: Self-pay

## 2020-04-25 ENCOUNTER — Ambulatory Visit (INDEPENDENT_AMBULATORY_CARE_PROVIDER_SITE_OTHER): Payer: BC Managed Care – PPO | Admitting: Licensed Clinical Social Worker

## 2020-04-25 ENCOUNTER — Encounter (HOSPITAL_COMMUNITY): Payer: Self-pay | Admitting: Licensed Clinical Social Worker

## 2020-04-25 DIAGNOSIS — F32A Depression, unspecified: Secondary | ICD-10-CM

## 2020-04-25 DIAGNOSIS — F329 Major depressive disorder, single episode, unspecified: Secondary | ICD-10-CM

## 2020-04-25 DIAGNOSIS — F1590 Other stimulant use, unspecified, uncomplicated: Secondary | ICD-10-CM | POA: Diagnosis not present

## 2020-04-25 DIAGNOSIS — F419 Anxiety disorder, unspecified: Secondary | ICD-10-CM | POA: Diagnosis not present

## 2020-04-25 DIAGNOSIS — F1121 Opioid dependence, in remission: Secondary | ICD-10-CM

## 2020-04-25 NOTE — Progress Notes (Signed)
Virtual Visit via Video Note  I connected with Kevin Andrade on 04/25/20 at  9:00 AM EDT by a video enabled telemedicine application and verified that I am speaking with the correct person using two identifiers.   I discussed the limitations of evaluation and management by telemedicine and the availability of in person appointments. The patient expressed understanding and agreed to proceed.  LOCATION: Patient: Home Provider: Home  History of Present Illness: Patient is referred for individual therapy after completion of CD-IOP for Opiod Use Disorder and hx of polysubstance abuse disorder, anxiety, depression. (Kratom & opiate pills)   Observations/Objective: Patient presents WNL for his virtual individual therapy appointment. Patient discussed his psychiatric symptoms and current life events. Patient reports his moods are more stable. Patient provides an update on his recovery. Patient is still concerned about his sponsor but is trying to work with him. Again, provided psychoeducation on what services most sponsors provide. Cln provided suggestions on "asking your sponsor for what you needs." Role played communication with sponsor. Patient discussed his continued cravings which are almost every day. Cln provided coping skills for cravings. Patient has a job Copy for a different job.              PLAN: gratitude journal   Follow Up Instructions: I discussed the assessment and treatment plan with the patient. The patient was provided an opportunity to ask questions and all were answered. The patient agreed with the plan and demonstrated an understanding of the instructions.   The patient was advised to call back or seek an in-person evaluation if the symptoms worsen or if the condition fails to improve as anticipated.  I provided 60 minutes of non-face-to-face time during this encounter.   Shaylen Nephew S, LCAS

## 2020-05-12 ENCOUNTER — Other Ambulatory Visit: Payer: Self-pay

## 2020-05-12 ENCOUNTER — Encounter (HOSPITAL_COMMUNITY): Payer: Self-pay | Admitting: Licensed Clinical Social Worker

## 2020-05-12 ENCOUNTER — Ambulatory Visit (INDEPENDENT_AMBULATORY_CARE_PROVIDER_SITE_OTHER): Payer: BC Managed Care – PPO | Admitting: Licensed Clinical Social Worker

## 2020-05-12 DIAGNOSIS — F1590 Other stimulant use, unspecified, uncomplicated: Secondary | ICD-10-CM

## 2020-05-12 DIAGNOSIS — F32A Depression, unspecified: Secondary | ICD-10-CM

## 2020-05-12 DIAGNOSIS — F329 Major depressive disorder, single episode, unspecified: Secondary | ICD-10-CM | POA: Diagnosis not present

## 2020-05-12 DIAGNOSIS — F419 Anxiety disorder, unspecified: Secondary | ICD-10-CM | POA: Diagnosis not present

## 2020-05-12 DIAGNOSIS — F1121 Opioid dependence, in remission: Secondary | ICD-10-CM | POA: Diagnosis not present
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
## 2020-05-16 ENCOUNTER — Ambulatory Visit (INDEPENDENT_AMBULATORY_CARE_PROVIDER_SITE_OTHER): Payer: BC Managed Care – PPO | Admitting: Licensed Clinical Social Worker

## 2020-05-16 ENCOUNTER — Encounter (HOSPITAL_COMMUNITY): Payer: Self-pay | Admitting: Licensed Clinical Social Worker

## 2020-05-16 ENCOUNTER — Other Ambulatory Visit: Payer: Self-pay

## 2020-05-16 DIAGNOSIS — F1121 Opioid dependence, in remission: Secondary | ICD-10-CM | POA: Diagnosis not present

## 2020-05-16 DIAGNOSIS — F1590 Other stimulant use, unspecified, uncomplicated: Secondary | ICD-10-CM | POA: Diagnosis not present

## 2020-05-16 DIAGNOSIS — F329 Major depressive disorder, single episode, unspecified: Secondary | ICD-10-CM | POA: Diagnosis not present

## 2020-05-16 DIAGNOSIS — F32A Depression, unspecified: Secondary | ICD-10-CM

## 2020-05-16 DIAGNOSIS — F419 Anxiety disorder, unspecified: Secondary | ICD-10-CM | POA: Diagnosis not present

## 2020-05-16 NOTE — Progress Notes (Signed)
Virtual Visit via Video Note  I connected with Kevin Andrade on 05/16/20 at  9:00 AM EDT by a video enabled telemedicine application and verified that I am speaking with the correct person using two identifiers.   I discussed the limitations of evaluation and management by telemedicine and the availability of in person appointments. The patient expressed understanding and agreed to proceed.  LOCATION: Patient: Home Provider: Home  History of Present Illness: Patient is referred for individual therapy after completion of CD-IOP for Opiod Use Disorder and hx of polysubstance abuse disorder, anxiety, depression. (Kratom & opiate pills)   Observations/Objective: Patient presents WNL for his virtual individual therapy appointment. Patient discussed his psychiatric symptoms and current life events. Patient reports his moods continue to be stable. Patient provides an update on his recovery, meetings, sponsor. Patient reports he's still on Step 2. Cln provided psychoeducation on step 2, having faith, higher power. Patient began discussion, "Why me, why am I an addict?"  PLAN: step 2 paperwork    PLAN: gratitude journal   Follow Up Instructions: I discussed the assessment and treatment plan with the patient. The patient was provided an opportunity to ask questions and all were answered. The patient agreed with the plan and demonstrated an understanding of the instructions.   The patient was advised to call back or seek an in-person evaluation if the symptoms worsen or if the condition fails to improve as anticipated.  I provided 60 minutes of non-face-to-face time during this encounter.   Kevin Andrade S, LCAS

## 2020-05-23 ENCOUNTER — Other Ambulatory Visit: Payer: Self-pay

## 2020-05-23 ENCOUNTER — Encounter (HOSPITAL_COMMUNITY): Payer: Self-pay | Admitting: Licensed Clinical Social Worker

## 2020-05-23 ENCOUNTER — Other Ambulatory Visit (HOSPITAL_COMMUNITY): Payer: Self-pay | Admitting: Medical

## 2020-05-23 ENCOUNTER — Ambulatory Visit (INDEPENDENT_AMBULATORY_CARE_PROVIDER_SITE_OTHER): Payer: BC Managed Care – PPO | Admitting: Licensed Clinical Social Worker

## 2020-05-23 ENCOUNTER — Telehealth (HOSPITAL_COMMUNITY): Payer: Self-pay

## 2020-05-23 DIAGNOSIS — F329 Major depressive disorder, single episode, unspecified: Secondary | ICD-10-CM | POA: Diagnosis not present

## 2020-05-23 DIAGNOSIS — F1590 Other stimulant use, unspecified, uncomplicated: Secondary | ICD-10-CM

## 2020-05-23 DIAGNOSIS — F419 Anxiety disorder, unspecified: Secondary | ICD-10-CM | POA: Diagnosis not present

## 2020-05-23 DIAGNOSIS — F1121 Opioid dependence, in remission: Secondary | ICD-10-CM | POA: Diagnosis not present

## 2020-05-23 DIAGNOSIS — F32A Depression, unspecified: Secondary | ICD-10-CM

## 2020-05-23 MED ORDER — NALTREXONE HCL 50 MG PO TABS: 50.0000 mg | ORAL_TABLET | Freq: Every day | ORAL | 3 refills | Status: DC

## 2020-05-23 NOTE — Progress Notes (Addendum)
Virtual Visit via Video Note  I connected with Kevin Andrade on 05/23/20 at  9:00 AM EDT by a video enabled telemedicine application and verified that I am speaking with the correct person using two identifiers.   I discussed the limitations of evaluation and management by telemedicine and the availability of in person appointments. The patient expressed understanding and agreed to proceed.  LOCATION: Patient: Home Provider: Home  History of Present Illness: Patient is referred for individual therapy after completion of CD-IOP for Opiod Use Disorder and hx of polysubstance abuse disorder, anxiety, depression. (Kratom & opiate pills)   Observations/Objective: Patient presents depressed and sick for his virtual individual therapy appointment. Patient discussed his psychiatric symptoms and current life events. "I've been sick since Thursday with a cold/flu symptoms, but I'm feeling Somewhat better today." Patient brought his Step 2 worksheets to session to discuss "restore [place] to sanity." Clinician utilized MI OARS to reflect and summarize thoughts and feelings. Patient reports "I struggle to keep my emotions in check." Cln provided psychoeducation on emotional regulation and emailed patient educational sheets to discuss at next session. Cln provided psychoeducation on mindfulness skills, living in the moment.     PLAN: step 2 paperwork, emotional regulation, emotional intelligence    PLAN: gratitude journal   Follow Up Instructions: I discussed the assessment and treatment plan with the patient. The patient was provided an opportunity to ask questions and all were answered. The patient agreed with the plan and demonstrated an understanding of the instructions.   The patient was advised to call back or seek an in-person evaluation if the symptoms worsen or if the condition fails to improve as anticipated.  I provided 60 minutes of non-face-to-face time during this  encounter.   Cathleen Yagi S, LCAS

## 2020-05-23 NOTE — Telephone Encounter (Signed)
Patient states he needs a refill on Naltrexone sent to [facility] in [place]

## 2020-05-23 NOTE — Telephone Encounter (Signed)
Rx sent 

## 2020-05-26 ENCOUNTER — Telehealth (HOSPITAL_COMMUNITY): Payer: Self-pay

## 2020-05-26 NOTE — Telephone Encounter (Signed)
Informed patient that he needs to call the pharmacy to have rx's switched. He states he still needs [place] to send the 90 day supply for Trintellix because insurance will not pay for the 30 day supply any more. Please send a 90 day supply of Trintellix to [facility] on [address]

## 2020-05-26 NOTE — Telephone Encounter (Signed)
Pt was seen 7/22 with Rx for 3 months-will switch pharmacy at next visit in October ?

## 2020-05-26 NOTE — Telephone Encounter (Signed)
Patient states he needs all medications switched to [facility] [address] in [place]. He also says the insurance will on pay for a 90 day supply for Trintellix.

## 2020-05-27 ENCOUNTER — Other Ambulatory Visit (HOSPITAL_COMMUNITY): Payer: Self-pay

## 2020-05-27 MED ORDER — VORTIOXETINE HBR 20 MG PO TABS: ORAL_TABLET | ORAL | 0 refills | Status: DC

## 2020-05-30 ENCOUNTER — Other Ambulatory Visit: Payer: Self-pay

## 2020-05-30 ENCOUNTER — Ambulatory Visit (INDEPENDENT_AMBULATORY_CARE_PROVIDER_SITE_OTHER): Payer: BC Managed Care – PPO | Admitting: Licensed Clinical Social Worker

## 2020-05-30 ENCOUNTER — Encounter (HOSPITAL_COMMUNITY): Payer: Self-pay | Admitting: Licensed Clinical Social Worker

## 2020-05-30 DIAGNOSIS — F419 Anxiety disorder, unspecified: Secondary | ICD-10-CM | POA: Diagnosis not present

## 2020-05-30 DIAGNOSIS — F32A Depression, unspecified: Secondary | ICD-10-CM

## 2020-05-30 DIAGNOSIS — F1590 Other stimulant use, unspecified, uncomplicated: Secondary | ICD-10-CM | POA: Diagnosis not present

## 2020-05-30 DIAGNOSIS — F1121 Opioid dependence, in remission: Secondary | ICD-10-CM

## 2020-05-30 NOTE — Progress Notes (Signed)
Virtual Visit via Video Note  I connected with Kevin Andrade on 05/30/20 at  9:00 AM EDT by a video enabled telemedicine application and verified that I am speaking with the correct person using two identifiers.   I discussed the limitations of evaluation and management by telemedicine and the availability of in person appointments. The patient expressed understanding and agreed to proceed.  LOCATION: Patient: Home Provider: Home  History of Present Illness: Patient is referred for individual therapy after completion of CD-IOP for Opiod Use Disorder and hx of polysubstance abuse disorder, anxiety, depression. (Kratom & opiate pills)   Observations/Objective: Patient presents depressed for his virtual individual therapy appointment. Patient discussed his psychiatric symptoms and current life events. "I almost relapsed this weekend." Used socratic questions. Patient shared his triggers, and coping skills he used for the triggers. "I came close to stopping at the store to buy kratom." Validated patient on his choice of copings skills. Cln provided a platform for him to talk about his triggers and provided patient with coping skills.  PLAN: step 2 paperwork, emotional regulation, emotional intelligence    PLAN: gratitude journal   Follow Up Instructions: I discussed the assessment and treatment plan with the patient. The patient was provided an opportunity to ask questions and all were answered. The patient agreed with the plan and demonstrated an understanding of the instructions.   The patient was advised to call back or seek an in-person evaluation if the symptoms worsen or if the condition fails to improve as anticipated.  I provided 60 minutes of non-face-to-face time during this encounter.   Demia Viera S, LCAS

## 2020-06-01 ENCOUNTER — Other Ambulatory Visit (HOSPITAL_COMMUNITY): Payer: Self-pay | Admitting: Medical

## 2020-06-01 MED ORDER — VORTIOXETINE HBR 20 MG PO TABS: 20.0000 mg | ORAL_TABLET | Freq: Every day | ORAL | 0 refills | Status: AC

## 2020-06-01 NOTE — Telephone Encounter (Signed)
Rx sent 

## 2020-06-02 ENCOUNTER — Telehealth (INDEPENDENT_AMBULATORY_CARE_PROVIDER_SITE_OTHER): Payer: BC Managed Care – PPO | Admitting: Medical

## 2020-06-02 DIAGNOSIS — F341 Dysthymic disorder: Secondary | ICD-10-CM

## 2020-06-02 DIAGNOSIS — F17200 Nicotine dependence, unspecified, uncomplicated: Secondary | ICD-10-CM

## 2020-06-02 DIAGNOSIS — F1590 Other stimulant use, unspecified, uncomplicated: Secondary | ICD-10-CM | POA: Diagnosis not present

## 2020-06-02 DIAGNOSIS — F1121 Opioid dependence, in remission: Secondary | ICD-10-CM | POA: Diagnosis not present

## 2020-06-02 DIAGNOSIS — F4312 Post-traumatic stress disorder, chronic: Secondary | ICD-10-CM

## 2020-06-02 DIAGNOSIS — F419 Anxiety disorder, unspecified: Secondary | ICD-10-CM

## 2020-06-02 DIAGNOSIS — F411 Generalized anxiety disorder: Secondary | ICD-10-CM

## 2020-06-02 DIAGNOSIS — Z811 Family history of alcohol abuse and dependence: Secondary | ICD-10-CM

## 2020-06-02 DIAGNOSIS — Z639 Problem related to primary support group, unspecified: Secondary | ICD-10-CM

## 2020-06-02 DIAGNOSIS — F3181 Bipolar II disorder: Secondary | ICD-10-CM

## 2020-06-02 MED ORDER — PREGABALIN 150 MG PO CAPS: 150.0000 mg | ORAL_CAPSULE | Freq: Three times a day (TID) | ORAL | 2 refills | Status: DC

## 2020-06-02 NOTE — Progress Notes (Signed)
Virtual Visit via Video Note  I connected withMichael Kashmir Andrade on 06/02/20 at 4:00 PM EDT by a video enabled telemedicine application My Chart from [facility] OP Kathryne Sharper at work to patient on his mobile phone at work  and verified that I am speaking with the correct person using two identifiers.  I discussed the limitations of evaluation and management by telemedicine and the availability of in person appointments. The patient expressed understanding and agreed to proceed.  History of Present Illness: AT LAST VISIT Kevin Andrade is seen for FU/Medication management of his Opiate dependency after discharge from [facility] OP CD IOP in November of 2020. In addition to his drug dependency/ies he has significant PTSD from childhood and was experiencing significant anxiety that finally responded to Lyrica, He continued MAT with oral Naltrexone.His dysthymia became difficult to control with Lexapro . Abilify was added but failed to produce desired response. These were discontinued and he has been on Trintellix for the past month and today says he has had a full response to this medication and to behavioral therapies from Counselor whom he is meeting weekly.  He is sleeping continues to be managed with Trazodone.   Counselor had reported "manic" symptoms. In speaking with him today this was more in line with attention deficit due to stress and incomplete response of mood to medications.
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
## 2020-06-04 ENCOUNTER — Encounter (HOSPITAL_COMMUNITY): Payer: Self-pay | Admitting: Medical

## 2020-06-06 ENCOUNTER — Other Ambulatory Visit: Payer: Self-pay

## 2020-06-06 ENCOUNTER — Ambulatory Visit (INDEPENDENT_AMBULATORY_CARE_PROVIDER_SITE_OTHER): Payer: BC Managed Care – PPO | Admitting: Licensed Clinical Social Worker

## 2020-06-06 ENCOUNTER — Encounter (HOSPITAL_COMMUNITY): Payer: Self-pay | Admitting: Licensed Clinical Social Worker

## 2020-06-06 DIAGNOSIS — F1121 Opioid dependence, in remission: Secondary | ICD-10-CM

## 2020-06-06 DIAGNOSIS — F419 Anxiety disorder, unspecified: Secondary | ICD-10-CM

## 2020-06-06 DIAGNOSIS — F32A Depression, unspecified: Secondary | ICD-10-CM | POA: Diagnosis not present

## 2020-06-06 DIAGNOSIS — F1590 Other stimulant use, unspecified, uncomplicated: Secondary | ICD-10-CM | POA: Diagnosis not present

## 2020-06-06 NOTE — Progress Notes (Signed)
Virtual Visit via Video Note  I connected with Kevin Andrade on 06/06/20 at  9:00 AM EDT by a video enabled telemedicine application and verified that I am speaking with the correct person using two identifiers.   I discussed the limitations of evaluation and management by telemedicine and the availability of in person appointments. The patient expressed understanding and agreed to proceed.  LOCATION: Patient: Home Provider: Home  History of Present Illness: Patient is referred for individual therapy after completion of CD-IOP for Opiod Use Disorder and hx of polysubstance abuse disorder, anxiety, depression. (Kratom & opiate pills)   Observations/Objective: Patient presents depressed for his virtual individual therapy appointment. Patient discussed his psychiatric symptoms and current life events. Patient reports he saw [name], PA, for medication management. Charles did not change any medication but patient thought the naltrexone was not working because he is having more cravings. Cln provided psychoeducation on triggers and cravings. Patient has identified his biggest stressor: Stress. "I've been working all weekend helping out everyone." Discussed alternatives to "helping out everyone." Patient expressed concerns about his early childhood memories and dealing with them. Cln provided suggestions on how to deal with negative thoughts and feelings, thought-stopping techniques.   PLAN: step 2 paperwork, emotional regulation, emotional intelligence    PLAN: gratitude journal   Follow Up Instructions: I discussed the assessment and treatment plan with the patient. The patient was provided an opportunity to ask questions and all were answered. The patient agreed with the plan and demonstrated an understanding of the instructions.   The patient was advised to call back or seek an in-person evaluation if the symptoms worsen or if the condition fails to improve as anticipated.  I  provided 60 minutes of non-face-to-face time during this encounter.   Tawona Filsinger S, LCAS

## 2020-06-13 ENCOUNTER — Encounter (HOSPITAL_COMMUNITY): Payer: Self-pay | Admitting: Licensed Clinical Social Worker

## 2020-06-13 ENCOUNTER — Ambulatory Visit (INDEPENDENT_AMBULATORY_CARE_PROVIDER_SITE_OTHER): Payer: BC Managed Care – PPO | Admitting: Licensed Clinical Social Worker

## 2020-06-13 ENCOUNTER — Other Ambulatory Visit: Payer: Self-pay

## 2020-06-13 DIAGNOSIS — F1590 Other stimulant use, unspecified, uncomplicated: Secondary | ICD-10-CM

## 2020-06-13 DIAGNOSIS — F1121 Opioid dependence, in remission: Secondary | ICD-10-CM

## 2020-06-13 DIAGNOSIS — F419 Anxiety disorder, unspecified: Secondary | ICD-10-CM

## 2020-06-13 DIAGNOSIS — F32A Depression, unspecified: Secondary | ICD-10-CM | POA: Diagnosis not present

## 2020-06-13 NOTE — Progress Notes (Signed)
Virtual Visit via Video Note  I connected with Kevin Andrade on 06/13/20 at  9:00 AM EDT by a video enabled telemedicine application and verified that I am speaking with the correct person using two identifiers.   I discussed the limitations of evaluation and management by telemedicine and the availability of in person appointments. The patient expressed understanding and agreed to proceed.  LOCATION: Patient: Home Provider: Home  History of Present Illness: Patient is referred for individual therapy after completion of CD-IOP for Opiod Use Disorder and hx of polysubstance abuse disorder, anxiety, depression. (Kratom & opiate pills)   Observations/Objective: Patient presents depressed for his virtual individual therapy appointment. Patient discussed his psychiatric symptoms and current life events. Patient reports "i've been continuing to have childhood memories." Cln provided a safe platform to share his memories. Patient questioned, "why, why me, what if, is this why i'm an addict?" Encouraged patient to use containment for thoughts on why, such as recognizing his thought and emotion pattern, "setting it aside", and using a coping skill right after it. Suggested patient watch [name] "Letting go of the past."     PLAN: [name], "letting go of the past" you tube videos. step 2 paperwork, emotional regulation, emotional intelligence    PLAN: gratitude journal   Follow Up Instructions: I discussed the assessment and treatment plan with the patient. The patient was provided an opportunity to ask questions and all were answered. The patient agreed with the plan and demonstrated an understanding of the instructions.   The patient was advised to call back or seek an in-person evaluation if the symptoms worsen or if the condition fails to improve as anticipated.  I provided 60 minutes of non-face-to-face time during this encounter.   Terriah Reggio S, LCAS

## 2020-06-27 ENCOUNTER — Encounter (HOSPITAL_COMMUNITY): Payer: Self-pay | Admitting: Licensed Clinical Social Worker

## 2020-06-27 ENCOUNTER — Other Ambulatory Visit (HOSPITAL_COMMUNITY): Payer: Self-pay | Admitting: Medical

## 2020-06-27 ENCOUNTER — Ambulatory Visit (INDEPENDENT_AMBULATORY_CARE_PROVIDER_SITE_OTHER): Payer: BC Managed Care – PPO | Admitting: Licensed Clinical Social Worker

## 2020-06-27 ENCOUNTER — Other Ambulatory Visit: Payer: Self-pay

## 2020-06-27 DIAGNOSIS — F419 Anxiety disorder, unspecified: Secondary | ICD-10-CM | POA: Diagnosis not present

## 2020-06-27 DIAGNOSIS — F1121 Opioid dependence, in remission: Secondary | ICD-10-CM | POA: Diagnosis not present

## 2020-06-27 DIAGNOSIS — F32A Depression, unspecified: Secondary | ICD-10-CM

## 2020-06-27 DIAGNOSIS — F1590 Other stimulant use, unspecified, uncomplicated: Secondary | ICD-10-CM

## 2020-06-27 NOTE — Progress Notes (Signed)
Virtual Visit via Video Note  I connected with Kevin Andrade on 06/27/20 at  9:00 AM EDT by a video enabled telemedicine application and verified that I am speaking with the correct person using two identifiers.   I discussed the limitations of evaluation and management by telemedicine and the availability of in person appointments. The patient expressed understanding and agreed to proceed.  LOCATION: Patient: Home Provider: Home  History of Present Illness: Patient is referred for individual therapy after completion of CD-IOP for Opiod Use Disorder and hx of polysubstance abuse disorder, anxiety, depression. (Kratom & opiate pills)   Observations/Objective: Patient presents depressed for his virtual individual therapy appointment. Patient discussed his psychiatric symptoms and current life events. Patient discussed his feelings surrounding the relationship with his son and his family. Cln assessed the family dynamics. Cln provided psychoeducation on dynamics of healthy vs unhealthy relationships associated with power and control. Cln validated and normalized patient's feelings while redirecting his focus to things that he can control,      PLAN: [name], "letting go of the past" you tube videos. step 2 paperwork, emotional regulation, emotional intelligence    PLAN: gratitude journal   Follow Up Instructions: I discussed the assessment and treatment plan with the patient. The patient was provided an opportunity to ask questions and all were answered. The patient agreed with the plan and demonstrated an understanding of the instructions.   The patient was advised to call back or seek an in-person evaluation if the symptoms worsen or if the condition fails to improve as anticipated.  I provided 60 minutes of non-face-to-face time during this encounter.   Cass Edinger S, LCAS

## 2020-07-04 ENCOUNTER — Telehealth (HOSPITAL_COMMUNITY): Payer: Self-pay

## 2020-07-04 ENCOUNTER — Ambulatory Visit (INDEPENDENT_AMBULATORY_CARE_PROVIDER_SITE_OTHER): Payer: BC Managed Care – PPO | Admitting: Licensed Clinical Social Worker

## 2020-07-04 ENCOUNTER — Other Ambulatory Visit: Payer: Self-pay

## 2020-07-04 ENCOUNTER — Other Ambulatory Visit (HOSPITAL_COMMUNITY): Payer: Self-pay | Admitting: Medical

## 2020-07-04 ENCOUNTER — Encounter (HOSPITAL_COMMUNITY): Payer: Self-pay | Admitting: Licensed Clinical Social Worker

## 2020-07-04 DIAGNOSIS — F419 Anxiety disorder, unspecified: Secondary | ICD-10-CM

## 2020-07-04 DIAGNOSIS — F1590 Other stimulant use, unspecified, uncomplicated: Secondary | ICD-10-CM | POA: Diagnosis not present

## 2020-07-04 DIAGNOSIS — F1121 Opioid dependence, in remission: Secondary | ICD-10-CM | POA: Diagnosis not present

## 2020-07-04 DIAGNOSIS — F32A Depression, unspecified: Secondary | ICD-10-CM | POA: Diagnosis not present

## 2020-07-04 MED ORDER — PREGABALIN 150 MG PO CAPS
150.0000 mg | ORAL_CAPSULE | Freq: Three times a day (TID) | ORAL | 1 refills | Status: DC
Start: 2020-07-04 — End: 2020-10-20

## 2020-07-04 NOTE — Telephone Encounter (Signed)
sent 

## 2020-07-04 NOTE — Telephone Encounter (Signed)
Patient would like Lyrica transferred to [facility] on [address] in [place] as the pharmacy will not transfer this medication

## 2020-07-04 NOTE — Progress Notes (Signed)
Virtual Visit via Video Note  I connected with [name] on 07/04/20 at  9:00 AM EST by a video enabled telemedicine application and verified that I am speaking with the correct person using two identifiers.   I discussed the limitations of evaluation and management by telemedicine and the availability of in person appointments. The patient expressed understanding and agreed to proceed.  LOCATION: Patient: Home Provider: Home  History of Present Illness: Patient is referred for individual therapy after completion of CD-IOP for Opiod Use Disorder and hx of polysubstance abuse disorder, anxiety, depression. (Kratom & opiate pills)   Observations/Objective: Patient presents depressed for his virtual individual therapy appointment. Patient discussed his psychiatric symptoms and current life events. Patient discussed his  cravings to use that he has experienced the last week. Assisted patient identifying his triggers. Cln suggested alternatives for patient to avoid his triggers. Role played alternatives and conversations to have with his family. Patient watch the [name] video suggested. Discussed with patient the benefits of the video. Patient has moved to Step 3. Sent patient workbook on step 3.    PLAN: step 3 workbook    PLAN: [name], "letting go of the past" you tube videos. step 2 paperwork, emotional regulation, emotional intelligence    PLAN: gratitude journal   Follow Up Instructions: I discussed the assessment and treatment plan with the patient. The patient was provided an opportunity to ask questions and all were answered. The patient agreed with the plan and demonstrated an understanding of the instructions.   The patient was advised to call back or seek an in-person evaluation if the symptoms worsen or if the condition fails to improve as anticipated.  I provided 45 minutes of non-face-to-face time during this encounter.   [name],  LCAS

## 2020-07-07 ENCOUNTER — Encounter (HOSPITAL_COMMUNITY): Payer: Self-pay | Admitting: Medical

## 2020-07-07 ENCOUNTER — Telehealth (INDEPENDENT_AMBULATORY_CARE_PROVIDER_SITE_OTHER): Payer: BC Managed Care – PPO | Admitting: Medical

## 2020-07-07 DIAGNOSIS — F19982 Other psychoactive substance use, unspecified with psychoactive substance-induced sleep disorder: Secondary | ICD-10-CM

## 2020-07-07 DIAGNOSIS — Z639 Problem related to primary support group, unspecified: Secondary | ICD-10-CM

## 2020-07-07 DIAGNOSIS — Z811 Family history of alcohol abuse and dependence: Secondary | ICD-10-CM

## 2020-07-07 DIAGNOSIS — F419 Anxiety disorder, unspecified: Secondary | ICD-10-CM

## 2020-07-07 DIAGNOSIS — F4312 Post-traumatic stress disorder, chronic: Secondary | ICD-10-CM | POA: Diagnosis not present

## 2020-07-07 DIAGNOSIS — F1021 Alcohol dependence, in remission: Secondary | ICD-10-CM | POA: Diagnosis not present

## 2020-07-07 DIAGNOSIS — F341 Dysthymic disorder: Secondary | ICD-10-CM

## 2020-07-07 DIAGNOSIS — F1421 Cocaine dependence, in remission: Secondary | ICD-10-CM | POA: Diagnosis not present

## 2020-07-07 DIAGNOSIS — F1121 Opioid dependence, in remission: Secondary | ICD-10-CM | POA: Diagnosis not present

## 2020-07-07 DIAGNOSIS — T7492XS Unspecified child maltreatment, confirmed, sequela: Secondary | ICD-10-CM
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
## 2020-07-11 ENCOUNTER — Ambulatory Visit (INDEPENDENT_AMBULATORY_CARE_PROVIDER_SITE_OTHER): Payer: BC Managed Care – PPO | Admitting: Licensed Clinical Social Worker

## 2020-07-11 ENCOUNTER — Encounter (HOSPITAL_COMMUNITY): Payer: Self-pay | Admitting: Licensed Clinical Social Worker

## 2020-07-11 ENCOUNTER — Other Ambulatory Visit: Payer: Self-pay

## 2020-07-11 DIAGNOSIS — F1121 Opioid dependence, in remission: Secondary | ICD-10-CM | POA: Diagnosis not present

## 2020-07-11 DIAGNOSIS — F1590 Other stimulant use, unspecified, uncomplicated: Secondary | ICD-10-CM

## 2020-07-11 DIAGNOSIS — F32A Depression, unspecified: Secondary | ICD-10-CM | POA: Diagnosis not present

## 2020-07-11 DIAGNOSIS — F419 Anxiety disorder, unspecified: Secondary | ICD-10-CM

## 2020-07-11 NOTE — Progress Notes (Signed)
Virtual Visit via Video Note  I connected with Jenaro Souder Smoker on 07/11/20 at  9:00 AM EST by a video enabled telemedicine application and verified that I am speaking with the correct person using two identifiers.   I discussed the limitations of evaluation and management by telemedicine and the availability of in person appointments. The patient expressed understanding and agreed to proceed.  LOCATION: Patient: Home Provider: Home  History of Present Illness: Patient is referred for individual therapy after completion of CD-IOP for Opiod Use Disorder and hx of polysubstance abuse disorder, anxiety, depression. (Kratom & opiate pills)   Observations/Objective: Patient presents depressed for his virtual individual therapy appointment. Patient discussed his psychiatric symptoms and current life events. Patient discussed his  Session with PA, [name], who did not changed any medications. PA  Recommends a EMDR therapist to deal with his traumatic childhood. Patient discussed his feelings about another therapist. Validated his feelings. Patient discussed his negative thoughts he continues to have. Cln provided psychoeducation on how to train your brain to be positive, how to overcome negative thought patterns. Cln suggested patient expand his friend circle, to have more positive people in his life. Pt was receptive to suggestion.      PLAN: step 3 workbook    PLAN: [name], "letting go of the past" you tube videos. step 2 paperwork, emotional regulation, emotional intelligence    PLAN: gratitude journal   Follow Up Instructions: I discussed the assessment and treatment plan with the patient. The patient was provided an opportunity to ask questions and all were answered. The patient agreed with the plan and demonstrated an understanding of the instructions.   The patient was advised to call back or seek an in-person evaluation if the symptoms worsen or if the condition fails to  improve as anticipated.  I provided 60 minutes of non-face-to-face time during this encounter.   Maybell Misenheimer S, LCAS

## 2020-07-18 ENCOUNTER — Encounter (HOSPITAL_COMMUNITY): Payer: Self-pay | Admitting: Licensed Clinical Social Worker

## 2020-07-18 ENCOUNTER — Ambulatory Visit (INDEPENDENT_AMBULATORY_CARE_PROVIDER_SITE_OTHER): Payer: BC Managed Care – PPO | Admitting: Licensed Clinical Social Worker

## 2020-07-18 ENCOUNTER — Other Ambulatory Visit: Payer: Self-pay

## 2020-07-18 DIAGNOSIS — F32A Depression, unspecified: Secondary | ICD-10-CM | POA: Diagnosis not present

## 2020-07-18 DIAGNOSIS — F419 Anxiety disorder, unspecified: Secondary | ICD-10-CM

## 2020-07-18 DIAGNOSIS — F1590 Other stimulant use, unspecified, uncomplicated: Secondary | ICD-10-CM | POA: Diagnosis not present

## 2020-07-18 DIAGNOSIS — F1121 Opioid dependence, in remission: Secondary | ICD-10-CM

## 2020-07-18 NOTE — Progress Notes (Signed)
Virtual Visit via Video Note  I connected with Kevin Andrade on 07/18/20 at  9:00 AM EST by a video enabled telemedicine application and verified that I am speaking with the correct person using two identifiers.   I discussed the limitations of evaluation and management by telemedicine and the availability of in person appointments. The patient expressed understanding and agreed to proceed.  LOCATION: Patient: Home Provider: Home Office  History of Present Illness: Patient is referred for individual therapy after completion of CD-IOP for Opiod Use Disorder and hx of polysubstance abuse disorder, anxiety, depression. (Kratom & opiate pills)   Observations/Objective: Patient presents WNL for his virtual individual therapy appointment. Patient discussed his psychiatric symptoms and current life events. Patient reports on his meeting attendance this past week, meeting with sponsor, step work. Patient is working on [employer] step 3. Cln provided psychoeducation on Higher Power and self-will. Patient described how his self will led to his continued use of drugs in spite of the affects on his family. Cln provided information on steps on building trust within his family unit. Patient reviewed EMDR information emailed and is willing to also see a EMDR therapist that will be recommended.   PLAN: step 3 workbook    PLAN: [name], "letting go of the past" you tube videos. step 2 paperwork, emotional regulation, emotional intelligence    PLAN: gratitude journal   Follow Up Instructions: I discussed the assessment and treatment plan with the patient. The patient was provided an opportunity to ask questions and all were answered. The patient agreed with the plan and demonstrated an understanding of the instructions.   The patient was advised to call back or seek an in-person evaluation if the symptoms worsen or if the condition fails to improve as anticipated.  I provided 45 minutes of  non-face-to-face time during this encounter.   [name], LCAS

## 2020-07-25 ENCOUNTER — Other Ambulatory Visit: Payer: Self-pay

## 2020-07-25 ENCOUNTER — Ambulatory Visit (INDEPENDENT_AMBULATORY_CARE_PROVIDER_SITE_OTHER): Payer: BC Managed Care – PPO | Admitting: Licensed Clinical Social Worker

## 2020-07-25 ENCOUNTER — Encounter (HOSPITAL_COMMUNITY): Payer: Self-pay | Admitting: Licensed Clinical Social Worker

## 2020-07-25 DIAGNOSIS — F32A Depression, unspecified: Secondary | ICD-10-CM

## 2020-07-25 DIAGNOSIS — F1121 Opioid dependence, in remission: Secondary | ICD-10-CM

## 2020-07-25 DIAGNOSIS — F419 Anxiety disorder, unspecified: Secondary | ICD-10-CM | POA: Diagnosis not present

## 2020-07-25 DIAGNOSIS — F1590 Other stimulant use, unspecified, uncomplicated: Secondary | ICD-10-CM | POA: Diagnosis not present

## 2020-07-25 NOTE — Progress Notes (Signed)
Virtual Visit via Video Note  I connected with Kevin Andrade on 07/25/20 at  9:00 AM EST by a video enabled telemedicine application and verified that I am speaking with the correct person using two identifiers.   I discussed the limitations of evaluation and management by telemedicine and the availability of in person appointments. The patient expressed understanding and agreed to proceed.  LOCATION: Patient: Home Provider: Home Office  History of Present Illness: Patient is referred for individual therapy after completion of CD-IOP for Opiod Use Disorder and hx of polysubstance abuse disorder, anxiety, depression. (Kratom & opiate pills)   Observations/Objective: Patient presents WNL for his virtual individual therapy appointment. Patient discussed his psychiatric symptoms and current life events. Patient reports on his Thanksgiving holiday, including family."I almost didn't go to my son's house because of previous family drama."  Cln requested, "Let's play the record all the way through." Cln asked open-ended questions where patient confirmed if he had stayed home alone I would have had thoughts of using. Patient described his recovery work this week: meeting attendance this past week, no meeting with sponsor due to holiday, step work. Patient continues working on AA step 3, reviewed booklet with patient.  Discussed EMDR therapist referral. Patient will talk with PA Eloisa Northern at next appointment.     PLAN: step 3 workbook    PLAN: gratitude journal   Follow Up Instructions: I discussed the assessment and treatment plan with the patient. The patient was provided an opportunity to ask questions and all were answered. The patient agreed with the plan and demonstrated an understanding of the instructions.   The patient was advised to call back or seek an in-person evaluation if the symptoms worsen or if the condition fails to improve as anticipated.  I provided 60 minutes of  non-face-to-face time during this encounter.   Carden Teel S, LCAS

## 2020-08-01 ENCOUNTER — Encounter (HOSPITAL_COMMUNITY): Payer: Self-pay | Admitting: Licensed Clinical Social Worker

## 2020-08-01 ENCOUNTER — Other Ambulatory Visit: Payer: Self-pay

## 2020-08-01 ENCOUNTER — Ambulatory Visit (INDEPENDENT_AMBULATORY_CARE_PROVIDER_SITE_OTHER): Payer: BC Managed Care – PPO | Admitting: Licensed Clinical Social Worker

## 2020-08-01 DIAGNOSIS — F419 Anxiety disorder, unspecified: Secondary | ICD-10-CM

## 2020-08-01 DIAGNOSIS — F1590 Other stimulant use, unspecified, uncomplicated: Secondary | ICD-10-CM | POA: Diagnosis not present

## 2020-08-01 DIAGNOSIS — F1121 Opioid dependence, in remission: Secondary | ICD-10-CM | POA: Diagnosis not present

## 2020-08-01 DIAGNOSIS — F32A Depression, unspecified: Secondary | ICD-10-CM | POA: Diagnosis not present

## 2020-08-01 NOTE — Progress Notes (Signed)
Virtual Visit via Video Note  I connected with Kevin Andrade on 08/01/20 at  9:00 AM EST by a video enabled telemedicine application and verified that I am speaking with the correct person using two identifiers.   I discussed the limitations of evaluation and management by telemedicine and the availability of in person appointments. The patient expressed understanding and agreed to proceed.  LOCATION: Patient: Home Provider: Home Office  History of Present Illness: Patient is referred for individual therapy after completion of CD-IOP for Opiod Use Disorder and hx of polysubstance abuse disorder, anxiety, depression. (Kratom & opiate pills)   Observations/Objective: Patient presents sad for his virtual individual therapy appointment. Patient discussed his psychiatric symptoms and current life events. Patient reports his father is in the hospital with covid/pneumonia. He is not allowed visitors. His siblings have been in touch with patient. Pt described his multitude of feelings: limited contact with father on  & most siblings recently. Cln used CBT to assist patient with addressing his negative thoughts and feelings. Cln provided psychoeducation on thought-stopping techniques. Cln validated his feelings.  PLAN: step 3 workbook    PLAN: gratitude journal   Follow Up Instructions: I discussed the assessment and treatment plan with the patient. The patient was provided an opportunity to ask questions and all were answered. The patient agreed with the plan and demonstrated an understanding of the instructions.   The patient was advised to call back or seek an in-person evaluation if the symptoms worsen or if the condition fails to improve as anticipated.  I provided 60 minutes of non-face-to-face time during this encounter.   Coltrane Tugwell S, LCAS  s

## 2020-08-08 ENCOUNTER — Other Ambulatory Visit: Payer: Self-pay

## 2020-08-08 ENCOUNTER — Ambulatory Visit (INDEPENDENT_AMBULATORY_CARE_PROVIDER_SITE_OTHER): Payer: BC Managed Care – PPO | Admitting: Licensed Clinical Social Worker

## 2020-08-08 ENCOUNTER — Encounter (HOSPITAL_COMMUNITY): Payer: Self-pay | Admitting: Licensed Clinical Social Worker

## 2020-08-08 DIAGNOSIS — F1590 Other stimulant use, unspecified, uncomplicated: Secondary | ICD-10-CM

## 2020-08-08 DIAGNOSIS — F1121 Opioid dependence, in remission: Secondary | ICD-10-CM | POA: Diagnosis not present

## 2020-08-08 DIAGNOSIS — F32A Depression, unspecified: Secondary | ICD-10-CM | POA: Diagnosis not present

## 2020-08-08 DIAGNOSIS — F419 Anxiety disorder, unspecified: Secondary | ICD-10-CM

## 2020-08-08 NOTE — Progress Notes (Signed)
Virtual Visit via Video Note  I connected with Cordale Manera Sharrow on 08/08/20 at  9:00 AM EST by a video enabled telemedicine application and verified that I am speaking with the correct person using two identifiers.   I discussed the limitations of evaluation and management by telemedicine and the availability of in person appointments. The patient expressed understanding and agreed to proceed.  LOCATION: Patient: Home Provider: Home Office  History of Present Illness: Patient is referred for individual therapy after completion of CD-IOP for Opiod Use Disorder and hx of polysubstance abuse disorder, anxiety, depression. (Kratom & opiate pills)   Observations/Objective: Patient presents anxious for his virtual individual therapy appointment. Patient discussed his psychiatric symptoms and current life events. Patient reports his father continues to be in the hospital with covid/pneumonia. He went and sat with his sister at the hospital even though they can't see him. Again, pt described his multitude of feelings: anger, frustration, sadness. He received a message that his mother wants to speak with him. Assisted patient work through his feelings about this request. Role played different scenarios if/when he calls his mother back. During session patient received a message that the family is being called in, as his father's health is declining. Ended session early.    PLAN: step 3 workbook    PLAN: gratitude journal   Follow Up Instructions: I discussed the assessment and treatment plan with the patient. The patient was provided an opportunity to ask questions and all were answered. The patient agreed with the plan and demonstrated an understanding of the instructions.   The patient was advised to call back or seek an in-person evaluation if the symptoms worsen or if the condition fails to improve as anticipated.  I provided 60 minutes of non-face-to-face time during this  encounter.   Karo Rog S, LCAS

## 2020-08-15 ENCOUNTER — Encounter (HOSPITAL_COMMUNITY): Payer: Self-pay | Admitting: Licensed Clinical Social Worker

## 2020-08-15 ENCOUNTER — Other Ambulatory Visit: Payer: Self-pay

## 2020-08-15 ENCOUNTER — Ambulatory Visit (INDEPENDENT_AMBULATORY_CARE_PROVIDER_SITE_OTHER): Payer: BC Managed Care – PPO | Admitting: Licensed Clinical Social Worker

## 2020-08-15 DIAGNOSIS — F1121 Opioid dependence, in remission: Secondary | ICD-10-CM | POA: Diagnosis not present

## 2020-08-15 DIAGNOSIS — F419 Anxiety disorder, unspecified: Secondary | ICD-10-CM | POA: Diagnosis not present

## 2020-08-15 DIAGNOSIS — F32A Depression, unspecified: Secondary | ICD-10-CM | POA: Diagnosis not present

## 2020-08-15 DIAGNOSIS — F1590 Other stimulant use, unspecified, uncomplicated: Secondary | ICD-10-CM

## 2020-08-15 NOTE — Progress Notes (Signed)
Virtual Visit via Video Note  I connected with Kevin Andrade on 08/15/20 at  9:00 AM EST by a video enabled telemedicine application and verified that I am speaking with the correct person using two identifiers.   I discussed the limitations of evaluation and management by telemedicine and the availability of in person appointments. The patient expressed understanding and agreed to proceed.  LOCATION: Patient: Home Provider: Home Office  History of Present Illness: Patient is referred for individual therapy after completion of CD-IOP for Opiod Use Disorder and hx of polysubstance abuse disorder, anxiety, depression. (Kratom & opiate pills)   Observations/Objective: Patient presents anxious for his virtual individual therapy appointment. Patient discussed his psychiatric symptoms and current life events. Patient reports his father continues to be in the hospital with pneumonia, but is much better. Pt reports he has been at the hospital almost daily with all of his siblings and his mother. Pt described his mixed emotions. Cln validated his feelings. Cln assessed his family dynamics. and unresolved/generational/historic issues. Cln role played with pt possible upcoming family drama communication errors.       PLAN: step 3 workbook    PLAN: gratitude journal   Follow Up Instructions: I discussed the assessment and treatment plan with the patient. The patient was provided an opportunity to ask questions and all were answered. The patient agreed with the plan and demonstrated an understanding of the instructions.   The patient was advised to call back or seek an in-person evaluation if the symptoms worsen or if the condition fails to improve as anticipated.  I provided 60 minutes of non-face-to-face time during this encounter.   Oriya Kettering S, LCAS

## 2020-08-24 DIAGNOSIS — R509 Fever, unspecified: Secondary | ICD-10-CM | POA: Diagnosis not present

## 2020-08-24 DIAGNOSIS — Z20828 Contact with and (suspected) exposure to other viral communicable diseases: Secondary | ICD-10-CM | POA: Diagnosis not present

## 2020-08-29 ENCOUNTER — Other Ambulatory Visit: Payer: Self-pay

## 2020-08-29 ENCOUNTER — Ambulatory Visit (HOSPITAL_COMMUNITY): Payer: BC Managed Care – PPO | Admitting: Licensed Clinical Social Worker

## 2020-09-01 ENCOUNTER — Telehealth (HOSPITAL_COMMUNITY): Payer: Self-pay

## 2020-09-01 MED ORDER — VORTIOXETINE HBR 20 MG PO TABS: ORAL_TABLET | ORAL | 0 refills | Status: DC

## 2020-09-01 NOTE — Telephone Encounter (Signed)
Patient called stating he needs a 90 day supply of Trintellix sent to [facility] on [address] in [place]

## 2020-09-01 NOTE — Telephone Encounter (Signed)
Pt did not make his FU appt-needs to be seen for RF

## 2020-09-05 ENCOUNTER — Encounter (HOSPITAL_COMMUNITY): Payer: Self-pay | Admitting: Licensed Clinical Social Worker

## 2020-09-05 ENCOUNTER — Ambulatory Visit (INDEPENDENT_AMBULATORY_CARE_PROVIDER_SITE_OTHER): Payer: BC Managed Care – PPO | Admitting: Licensed Clinical Social Worker

## 2020-09-05 ENCOUNTER — Other Ambulatory Visit: Payer: Self-pay

## 2020-09-05 DIAGNOSIS — F419 Anxiety disorder, unspecified: Secondary | ICD-10-CM | POA: Diagnosis not present

## 2020-09-05 DIAGNOSIS — F1590 Other stimulant use, unspecified, uncomplicated: Secondary | ICD-10-CM

## 2020-09-05 DIAGNOSIS — F1121 Opioid dependence, in remission: Secondary | ICD-10-CM | POA: Diagnosis not present

## 2020-09-05 DIAGNOSIS — F32A Depression, unspecified: Secondary | ICD-10-CM

## 2020-09-05 NOTE — Progress Notes (Signed)
Virtual Visit via Video Note  I connected with Kevin Andrade Andreen on 09/05/20 at  9:00 AM EST by a video enabled telemedicine application and verified that I am speaking with the correct person using two identifiers.   I discussed the limitations of evaluation and management by telemedicine and the availability of in person appointments. The patient expressed understanding and agreed to proceed.  LOCATION: Patient: Home Provider: Home Office  History of Present Illness: Patient is referred for individual therapy after completion of CD-IOP for Opiod Use Disorder and hx of polysubstance abuse disorder, anxiety, depression. (Kratom & opiate pills)   Observations/Objective: Patient presents depressed for his virtual individual therapy appointment. Patient discussed his psychiatric symptoms and current life events. Patient has not been to therapy for 3 weeks due to holiday and patient being sick with Covid. Before Christmas Kevin Andrade's father passed away. Kevin Andrade dicussed his feelings about his passing, funeral and family. Kevin Andrade described his mixed emotions about the passing of his father due to the estranged relationship. Cln validated his feelings. Cln provided psychoeducation on grief.     PLAN: gratitude journal   Follow Up Instructions: I discussed the assessment and treatment plan with the patient. The patient was provided an opportunity to ask questions and all were answered. The patient agreed with the plan and demonstrated an understanding of the instructions.   The patient was advised to call back or seek an in-person evaluation if the symptoms worsen or if the condition fails to improve as anticipated.  I provided 45 minutes of non-face-to-face time during this encounter.   Jacques Fife S, LCAS

## 2020-09-12 ENCOUNTER — Ambulatory Visit (HOSPITAL_COMMUNITY): Payer: BC Managed Care – PPO | Admitting: Licensed Clinical Social Worker

## 2020-09-12 ENCOUNTER — Other Ambulatory Visit: Payer: Self-pay

## 2020-09-19 ENCOUNTER — Ambulatory Visit (INDEPENDENT_AMBULATORY_CARE_PROVIDER_SITE_OTHER): Payer: BC Managed Care – PPO | Admitting: Licensed Clinical Social Worker

## 2020-09-19 ENCOUNTER — Encounter (HOSPITAL_COMMUNITY): Payer: Self-pay | Admitting: Licensed Clinical Social Worker

## 2020-09-19 ENCOUNTER — Other Ambulatory Visit: Payer: Self-pay

## 2020-09-19 DIAGNOSIS — F419 Anxiety disorder, unspecified: Secondary | ICD-10-CM | POA: Diagnosis not present

## 2020-09-19 DIAGNOSIS — F1590 Other stimulant use, unspecified, uncomplicated: Secondary | ICD-10-CM

## 2020-09-19 DIAGNOSIS — F32A Depression, unspecified: Secondary | ICD-10-CM | POA: Diagnosis not present

## 2020-09-19 DIAGNOSIS — F1121 Opioid dependence, in remission: Secondary | ICD-10-CM | POA: Diagnosis not present

## 2020-09-19 NOTE — Progress Notes (Signed)
Virtual Visit via Video Note  I connected with Cashel Bellina Woolsey on 09/19/20 at  9:00 AM EST by a video enabled telemedicine application and verified that I am speaking with the correct person using two identifiers.   I discussed the limitations of evaluation and management by telemedicine and the availability of in person appointments. The patient expressed understanding and agreed to proceed.  LOCATION: Patient: Home Provider: Home Office  History of Present Illness: Patient is referred for individual therapy after completion of CD-IOP for Opiod Use Disorder and hx of polysubstance abuse disorder, anxiety, depression. (Kratom & opiate pills)   Observations/Objective: Patient presents depressed for his virtual individual therapy appointment. Patient discussed his psychiatric symptoms and current life events. "I'm not doing well." Used socratic questions. "I'm extremely busy at work, exhausted, short tempered."  Clinician utilized MI OARS to reflect and summarize thoughts and feelings about this new normal he is urrently experiencing. Clinician discussed the importance of focusing on the here and now, what he can control, rather than what he cannot. Cln discussed concerns about relapse potential. Cln utilized MI OARS to summarize relapse potential, red flags and relapse prevention.    PLAN: gratitude journal   Follow Up Instructions: I discussed the assessment and treatment plan with the patient. The patient was provided an opportunity to ask questions and all were answered. Thatient agreed with the plan and demonstrated an understanding of the instructions.   The patient was advised to call back or seek an in-person evaluation if the symptoms worsen or if the condition fails to improve as anticipated.  I provided 60 minutes of non-face-to-face time during this encounter.   Florentina Marquart S, LCAS

## 2020-09-26 ENCOUNTER — Ambulatory Visit (INDEPENDENT_AMBULATORY_CARE_PROVIDER_SITE_OTHER): Payer: BC Managed Care – PPO | Admitting: Licensed Clinical Social Worker

## 2020-09-26 ENCOUNTER — Encounter (HOSPITAL_COMMUNITY): Payer: Self-pay | Admitting: Licensed Clinical Social Worker

## 2020-09-26 ENCOUNTER — Other Ambulatory Visit: Payer: Self-pay

## 2020-09-26 DIAGNOSIS — F1590 Other stimulant use, unspecified, uncomplicated: Secondary | ICD-10-CM | POA: Diagnosis not present

## 2020-09-26 DIAGNOSIS — F419 Anxiety disorder, unspecified: Secondary | ICD-10-CM

## 2020-09-26 DIAGNOSIS — F1121 Opioid dependence, in remission: Secondary | ICD-10-CM

## 2020-09-26 DIAGNOSIS — F32A Depression, unspecified: Secondary | ICD-10-CM

## 2020-09-26 NOTE — Progress Notes (Signed)
Virtual Visit via Video Note  I connected with Kevin Andrade on 09/26/20 at  9:00 AM EST by a video enabled telemedicine application and verified that I am speaking with the correct person using two identifiers.   I discussed the limitations of evaluation and management by telemedicine and the availability of in person appointments. The patient expressed understanding and agreed to proceed.  LOCATION: Patient: Home Provider: Home Office  History of Present Illness: Patient is referred for individual therapy after completion of CD-IOP for Opiod Use Disorder and hx of polysubstance abuse disorder, anxiety, depression. (Kratom & opiate pills)   Observations/Objective: Patient presents WNL for his virtual individual therapy appointment. Patient discussed his psychiatric symptoms and current life events. Patient reports his moods continue to be mixed due to his work life. Cln assisted pt with identifying pros and cons of staying with current job vs looking for a different job. Pt reports he is concerned if he worked out of town. Cln utilized MI OARS to reflect and summarize his choices. Pt reports he used his anxiety coping skills 5 days within the last week during his work hours. Pt practiced in session coping skills.         PLAN: gratitude journal   Follow Up Instructions: I discussed the assessment and treatment plan with the patient. The patient was provided an opportunity to ask questions and all were answered. Thatient agreed with the plan and demonstrated an understanding of the instructions.   The patient was advised to call back or seek an in-person evaluation if the symptoms worsen or if the condition fails to improve as anticipated.  I provided 60 minutes of non-face-to-face time during this encounter.   MACKENZIE,LISBETH S, LCAS

## 2020-10-03 ENCOUNTER — Ambulatory Visit (INDEPENDENT_AMBULATORY_CARE_PROVIDER_SITE_OTHER): Payer: BC Managed Care – PPO | Admitting: Licensed Clinical Social Worker

## 2020-10-03 ENCOUNTER — Other Ambulatory Visit: Payer: Self-pay

## 2020-10-03 ENCOUNTER — Encounter (HOSPITAL_COMMUNITY): Payer: Self-pay | Admitting: Licensed Clinical Social Worker

## 2020-10-03 DIAGNOSIS — F1121 Opioid dependence, in remission: Secondary | ICD-10-CM | POA: Diagnosis not present

## 2020-10-03 DIAGNOSIS — F1590 Other stimulant use, unspecified, uncomplicated: Secondary | ICD-10-CM

## 2020-10-03 DIAGNOSIS — F32A Depression, unspecified: Secondary | ICD-10-CM

## 2020-10-03 DIAGNOSIS — F419 Anxiety disorder, unspecified: Secondary | ICD-10-CM

## 2020-10-03 NOTE — Progress Notes (Signed)
Virtual Visit via Video Note  I connected with Kevin Andrade on 10/03/20 at  9:00 AM EST by a video enabled telemedicine application and verified that I am speaking with the correct person using two identifiers.   I discussed the limitations of evaluation and management by telemedicine and the availability of in person appointments. The patient expressed understanding and agreed to proceed.  LOCATION: Patient: Home Provider: Home Office  Therapy Goal addressed:  Stress the importance of NA meetings AEB by 3 meeting attendance per week, working with sponsor 1x per week completing step work.   History of Present Illness: Patient is referred for individual therapy after completion of CD-IOP for Opiod Use Disorder and hx of polysubstance abuse disorder, anxiety, depression. (Kratom & opiate pills)   Observations/Objective: Patient presents WNL for his virtual individual therapy appointment. Patient discussed his psychiatric symptoms and current life events. Patient reports on his current recovery efforts: meetings, step work, sponsorship. Clinician utilized MI OARS to reflect and summarize thoughts and feelings about his recovery plan. Clinician utilized MI OARS to affirm concerns. Clinician challenged thoughts about his sponsor and step work, encouraging patient to work on more self study or find a Dance movement psychotherapist. Clinician processed options for communicating his concerns to his sponsor.Role played with patient talking to his sponsor about his needs for moving forward in his recovery.   PLAN: gratitude journal   Follow Up Instructions: I discussed the assessment and treatment plan with the patient. The patient was provided an opportunity to ask questions and all were answered. Thatient agreed with the plan and demonstrated an understanding of the instructions.   The patient was advised to call back or seek an in-person evaluation if the symptoms worsen or if the condition fails to improve as  anticipated.  I provided 60 minutes of non-face-to-face time during this encounter.   Hennessey Cantrell S, LCAS

## 2020-10-10 ENCOUNTER — Ambulatory Visit (INDEPENDENT_AMBULATORY_CARE_PROVIDER_SITE_OTHER): Payer: BC Managed Care – PPO | Admitting: Licensed Clinical Social Worker

## 2020-10-10 ENCOUNTER — Other Ambulatory Visit: Payer: Self-pay

## 2020-10-10 ENCOUNTER — Encounter (HOSPITAL_COMMUNITY): Payer: Self-pay | Admitting: Licensed Clinical Social Worker

## 2020-10-10 DIAGNOSIS — F1590 Other stimulant use, unspecified, uncomplicated: Secondary | ICD-10-CM | POA: Diagnosis not present

## 2020-10-10 DIAGNOSIS — F419 Anxiety disorder, unspecified: Secondary | ICD-10-CM | POA: Diagnosis not present

## 2020-10-10 DIAGNOSIS — F1121 Opioid dependence, in remission: Secondary | ICD-10-CM | POA: Diagnosis not present

## 2020-10-10 DIAGNOSIS — F32A Depression, unspecified: Secondary | ICD-10-CM

## 2020-10-10 NOTE — Progress Notes (Signed)
Virtual Visit via Video Note  I connected with Kevin Andrade on 10/10/20 at  9:00 AM EST by a video enabled telemedicine application and verified that I am speaking with the correct person using two identifiers.   I discussed the limitations of evaluation and management by telemedicine and the availability of in person appointments. The patient expressed understanding and agreed to proceed.  LOCATION: Patient: Home Provider: Home Office  Therapy Goal addressed:  Stress the importance of NA meetings AEB by 3 meeting attendance per week, working with sponsor 1x per week completing step work.   History of Present Illness: Patient is referred for individual therapy after completion of CD-IOP for Opiod Use Disorder and hx of polysubstance abuse disorder, anxiety, depression. (Kratom & opiate pills)   Observations/Objective: Patient presents WNL for his virtual individual therapy appointment. Patient discussed his psychiatric symptoms and current life events.Cln reviewed tx plan and pt verbalized acceptance of the plan. Clinician processed with client changes over the previous 2 weeks to support sobriety and build recovery network. Clinician praised client engagement with 12 step community and meeting attendance. Clinician provided brief overview of [employer] and the benefits for his family. Pt discussed  his decision to look for a new sponsor. Cln provided psychoeducation on sponsorship Cln during session researched [employer] meetings in area and emailed pt information.        PLAN: PA Eloisa [place]   Follow Up Instructions: I discussed the assessment and treatment plan with the patient. The patient was provided an opportunity to ask questions and all were answered. Thatient agreed with the plan and demonstrated an understanding of the instructions.   The patient was advised to call back or seek an in-person evaluation if the symptoms worsen or if the condition fails to improve as  anticipated.  I provided 60 minutes of non-face-to-face time during this encounter.   Denetta Fei S, LCAS

## 2020-10-17 ENCOUNTER — Encounter (HOSPITAL_COMMUNITY): Payer: Self-pay | Admitting: Licensed Clinical Social Worker

## 2020-10-17 ENCOUNTER — Ambulatory Visit (INDEPENDENT_AMBULATORY_CARE_PROVIDER_SITE_OTHER): Payer: BC Managed Care – PPO | Admitting: Licensed Clinical Social Worker

## 2020-10-17 ENCOUNTER — Other Ambulatory Visit: Payer: Self-pay

## 2020-10-17 DIAGNOSIS — F1121 Opioid dependence, in remission: Secondary | ICD-10-CM

## 2020-10-17 DIAGNOSIS — F1590 Other stimulant use, unspecified, uncomplicated: Secondary | ICD-10-CM

## 2020-10-17 DIAGNOSIS — F419 Anxiety disorder, unspecified: Secondary | ICD-10-CM

## 2020-10-17 DIAGNOSIS — F32A Depression, unspecified: Secondary | ICD-10-CM | POA: Diagnosis not present

## 2020-10-17 NOTE — Progress Notes (Signed)
Virtual Visit via Video Note  I connected with Kevin Andrade on 10/17/20 at  9:00 AM EST by a video enabled telemedicine application and verified that I am speaking with the correct person using two identifiers.   I discussed the limitations of evaluation and management by telemedicine and the availability of in person appointments. The patient expressed understanding and agreed to proceed.  LOCATION: Patient: Home Provider: Home Office  Therapy Goal addressed:  Stress the importance of NA meetings AEB by 3 meeting attendance per week, working with sponsor 1x per week completing step work.   History of Present Illness: Patient is referred for individual therapy after completion of CD-IOP for Opiod Use Disorder and hx of polysubstance abuse disorder, anxiety, depression. (Kratom & opiate pills)   Observations/Objective:Patient presented for today's session on time and was alert, oriented x5, with no evidence or self-report of SI/HI or A/V H.  Patient reported ongoing compliance with medication and denied any use of alcohol or illicit substances.  Clinician inquired about patient's current emotional ratings, as well as any significant changes in thoughts, feelings or behavior since previous session.  Patient reported scores of 2/10 for depression, 2/10 for anxiety, 2/10 for anger/irritability. Patient reports being sick since Friday, but tested negative for COVID. Patient reports he has appointment with [name], PA, tomorrow to reviewe medications. Clinician processed with client changes over the previous  week to support sobriety and build his recovery network. Clinician praised client engagement with 12 step community and meeting attendance. Patient described his first meeting with his new sponsor was by phone, due to patient begin sick. Patient/Sponsor will restart step work, beginning with Step 1. Cln explored with patient his latest communication with his mother, which led to fears he has  of trusting her again.  Clinician utilized CBT to address his thought processes. Clinician provided thought stopping tools, as well as reality testing to provide support and confidence in his decisions.  During session, patient received a text stating his aunt had passed away, so session ended early.    PLAN:Cln during session researched [employer] meetings in area and emailed pt information.     Follow Up Instructions: I discussed the assessment and treatment plan with the patient. The patient was provided an opportunity to ask questions and all were answered. Thatient agreed with the plan and demonstrated an understanding of the instructions.   The patient was advised to call back or seek an in-person evaluation if the symptoms worsen or if the condition fails to improve as anticipated.  I provided 30 minutes of non-face-to-face time during this encounter.   Pressley Barsky S, LCAS

## 2020-10-18 ENCOUNTER — Telehealth (HOSPITAL_COMMUNITY): Payer: BC Managed Care – PPO | Admitting: Medical

## 2020-10-20 ENCOUNTER — Telehealth (INDEPENDENT_AMBULATORY_CARE_PROVIDER_SITE_OTHER): Payer: BC Managed Care – PPO | Admitting: Medical

## 2020-10-20 ENCOUNTER — Encounter (HOSPITAL_COMMUNITY): Payer: Self-pay | Admitting: Medical

## 2020-10-20 DIAGNOSIS — F341 Dysthymic disorder: Secondary | ICD-10-CM | POA: Diagnosis not present

## 2020-10-20 DIAGNOSIS — F411 Generalized anxiety disorder: Secondary | ICD-10-CM

## 2020-10-20 DIAGNOSIS — Z639 Problem related to primary support group, unspecified: Secondary | ICD-10-CM

## 2020-10-20 DIAGNOSIS — T7492XS Unspecified child maltreatment, confirmed, sequela: Secondary | ICD-10-CM

## 2020-10-20 DIAGNOSIS — F1120 Opioid dependence, uncomplicated: Secondary | ICD-10-CM | POA: Diagnosis not present

## 2020-10-20 DIAGNOSIS — F4312 Post-traumatic stress disorder, chronic: Secondary | ICD-10-CM | POA: Diagnosis not present

## 2020-10-20 DIAGNOSIS — F17200 Nicotine dependence, unspecified, uncomplicated: Secondary | ICD-10-CM

## 2020-10-20 DIAGNOSIS — F1921 Other psychoactive substance dependence, in remission: Secondary | ICD-10-CM | POA: Diagnosis not present

## 2020-10-20 DIAGNOSIS — Z811 Family history of alcohol abuse and dependence: Secondary | ICD-10-CM

## 2020-10-20 DIAGNOSIS — F1920 Other psychoactive substance dependence, uncomplicated: Secondary | ICD-10-CM

## 2020-10-20 MED ORDER — PREGABALIN 150 MG PO CAPS: 150.0000 mg | ORAL_CAPSULE | Freq: Three times a day (TID) | ORAL | 1 refills | Status: DC

## 2020-10-20 MED ORDER — VORTIOXETINE HBR 20 MG PO TABS: 20.0000 mg | ORAL_TABLET | Freq: Every day | ORAL | 0 refills | Status: AC
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
## 2020-10-24 ENCOUNTER — Encounter (HOSPITAL_COMMUNITY): Payer: Self-pay | Admitting: Licensed Clinical Social Worker

## 2020-10-24 ENCOUNTER — Ambulatory Visit (INDEPENDENT_AMBULATORY_CARE_PROVIDER_SITE_OTHER): Payer: BC Managed Care – PPO | Admitting: Licensed Clinical Social Worker

## 2020-10-24 ENCOUNTER — Other Ambulatory Visit: Payer: Self-pay

## 2020-10-24 DIAGNOSIS — F1590 Other stimulant use, unspecified, uncomplicated: Secondary | ICD-10-CM

## 2020-10-24 DIAGNOSIS — F32A Depression, unspecified: Secondary | ICD-10-CM | POA: Diagnosis not present

## 2020-10-24 DIAGNOSIS — F411 Generalized anxiety disorder: Secondary | ICD-10-CM

## 2020-10-24 DIAGNOSIS — F1121 Opioid dependence, in remission: Secondary | ICD-10-CM

## 2020-10-24 NOTE — Progress Notes (Signed)
Virtual Visit via Video Note  I connected with Kevin Andrade on 10/24/20 at  9:00 AM EST by a video enabled telemedicine application and verified that I am speaking with the correct person using two identifiers.   I discussed the limitations of evaluation and management by telemedicine and the availability of in person appointments. The patient expressed understanding and agreed to proceed.  LOCATION: Patient: Home Provider: Home Office  Therapy Goal addressed:  Stress the importance of NA meetings AEB by 3 meeting attendance per week, working with sponsor 1x per week completing step work.   History of Present Illness: Patient is referred for individual therapy after completion of CD-IOP for Opiod Use Disorder and hx of polysubstance abuse disorder, anxiety, depression. (Kratom & opiate pills)   Observations/Objective: Patient presented for today's session on time and was alert, oriented x5, with no evidence or self-report of SI/HI or A/V H.  Patient reported ongoing compliance with medication and denied any use of alcohol or illicit substances.  Clinician inquired about patient's current emotional ratings, as well as any significant changes in thoughts, feelings or behavior since previous session.  Patient reported scores of 3/10 for depression, 3/10 for anxiety, 2/10 for anger/irritability. Patient reports he has a job interview this morning. Cln asked open ended questions. Patient reports meeting with his new sponsor on Saturday, where they started work on 12 traditions os NA. Cln explored with patient the 1st NA tradition.  Cln conducted screenings: PHQ9 and C-SSRS. Cln explored each question and answer with patient. Clinician processed with client changes over the previous  week to support sobriety and build his recovery network. Clinician praised client engagement with 12 step community and meeting attendance.     PLAN:Cln during session researched [place] meetings in area and  emailed pt information.     Follow Up Instructions: I discussed the assessment and treatment plan with the patient. The patient was provided an opportunity to ask questions and all were answered. Thatient agreed with the plan and demonstrated an understanding of the instructions.   The patient was advised to call back or seek an in-person evaluation if the symptoms worsen or if the condition fails to improve as anticipated.  I provided 60 minutes of non-face-to-face time during this encounter.   Chantrell Apsey S, LCAS

## 2020-10-31 ENCOUNTER — Encounter (HOSPITAL_COMMUNITY): Payer: Self-pay | Admitting: Licensed Clinical Social Worker

## 2020-10-31 ENCOUNTER — Ambulatory Visit (INDEPENDENT_AMBULATORY_CARE_PROVIDER_SITE_OTHER): Payer: BC Managed Care – PPO | Admitting: Licensed Clinical Social Worker

## 2020-10-31 ENCOUNTER — Other Ambulatory Visit: Payer: Self-pay

## 2020-10-31 DIAGNOSIS — F411 Generalized anxiety disorder: Secondary | ICD-10-CM

## 2020-10-31 DIAGNOSIS — F1121 Opioid dependence, in remission: Secondary | ICD-10-CM

## 2020-10-31 DIAGNOSIS — F1590 Other stimulant use, unspecified, uncomplicated: Secondary | ICD-10-CM

## 2020-10-31 DIAGNOSIS — F32A Depression, unspecified: Secondary | ICD-10-CM | POA: Diagnosis not present

## 2020-10-31 NOTE — Progress Notes (Signed)
Virtual Visit via Video Note  I connected with Kevin Andrade on 10/31/20 at  9:00 AM EST by a video enabled telemedicine application and verified that I am speaking with the correct person using two identifiers.   I discussed the limitations of evaluation and management by telemedicine and the availability of in person appointments. The patient expressed understanding and agreed to proceed.  LOCATION: Patient: Home Provider: Home Office  Therapy Goal addressed:  Stress the importance of NA meetings AEB by 3 meeting attendance per week, working with sponsor 1x per week completing step work.   History of Present Illness: Patient is referred for individual therapy after completion of CD-IOP for Opiod Use Disorder and hx of polysubstance abuse disorder, anxiety, depression. (Kratom & opiate pills)   Observations/Objective: Patient presented for today's session on time and was alert, oriented x5, with no evidence or self-report of SI/HI or A/V H.  Patient reported ongoing compliance with medication and denied any use of alcohol or illicit substances.  Clinician inquired about patient's current emotional ratings, as well as any significant changes in thoughts, feelings or behavior since previous session.  Patient reported scores of 3/10 for depression, 3/10 for anxiety, 2/10 for anger/irritability. Clinician processed with client changes over the previous week to support sobriety and build his recovery network. Clinician praised client engagement with 12 step community and meeting attendance. Patient reports he met with sponsor over the weekend and continues to work on NA traditions. Cln explored with patient "behaviors when I was using," continuing the discussion from his sponsor. Cln discussed with patient  the [place] information emailed for pt's wife. Cln will continue to monitor.    PLAN:Cln during session researched [place] meetings in area and emailed pt information.     Follow Up  Instructions: I discussed the assessment and treatment plan with the patient. The patient was provided an opportunity to ask questions and all were answered. Thatient agreed with the plan and demonstrated an understanding of the instructions.   The patient was advised to call back or seek an in-person evaluation if the symptoms worsen or if the condition fails to improve as anticipated.  I provided 60 minutes of non-face-to-face time during this encounter.   Indigo Chaddock S, LCAS

## 2020-11-07 ENCOUNTER — Other Ambulatory Visit: Payer: Self-pay

## 2020-11-07 ENCOUNTER — Ambulatory Visit (HOSPITAL_COMMUNITY): Payer: BC Managed Care – PPO | Admitting: Licensed Clinical Social Worker

## 2020-11-09 ENCOUNTER — Encounter (HOSPITAL_COMMUNITY): Payer: Self-pay | Admitting: Licensed Clinical Social Worker

## 2020-11-09 ENCOUNTER — Other Ambulatory Visit: Payer: Self-pay

## 2020-11-09 ENCOUNTER — Ambulatory Visit (INDEPENDENT_AMBULATORY_CARE_PROVIDER_SITE_OTHER): Payer: BC Managed Care – PPO | Admitting: Licensed Clinical Social Worker

## 2020-11-09 DIAGNOSIS — F411 Generalized anxiety disorder: Secondary | ICD-10-CM

## 2020-11-09 DIAGNOSIS — F1121 Opioid dependence, in remission: Secondary | ICD-10-CM

## 2020-11-09 DIAGNOSIS — F32A Depression, unspecified: Secondary | ICD-10-CM

## 2020-11-09 DIAGNOSIS — F1590 Other stimulant use, unspecified, uncomplicated: Secondary | ICD-10-CM | POA: Diagnosis not present

## 2020-11-09 NOTE — Progress Notes (Signed)
Virtual Visit via Video Note  I connected with Kevin Andrade on 11/09/20 at  9:00 AM EDT by a video enabled telemedicine application and verified that I am speaking with the correct person using two identifiers.   I discussed the limitations of evaluation and management by telemedicine and the availability of in person appointments. The patient expressed understanding and agreed to proceed.  LOCATION: Patient: Home Provider: Home Office  Therapy Goal addressed:  Stress the importance of NA meetings AEB by 3 meeting attendance per week, working with sponsor 1x per week completing step work.   History of Present Illness: Patient is referred for individual therapy after completion of CD-IOP for Opiod Use Disorder and hx of polysubstance abuse disorder, anxiety, depression. (Kratom & opiate pills)   Observations/Objective: Patient presented for today's session on time and was alert, oriented x5, with no evidence or self-report of SI/HI or A/V H.  Patient reported ongoing compliance with medication and denied any use of alcohol or illicit substances.  Clinician inquired about patient's current emotional ratings, as well as any significant changes in thoughts, feelings or behavior since previous session.  Patient reported scores of 3/10 for depression, 3/10 for anxiety, 3/10 for anger/irritability. Clinician processed with client changes over the previous week to support sobriety and build his recovery network. Clinician praised client engagement with 12 step community and meeting attendance. Patient reports he met with sponsor over the weekend and continues to work on NA traditions. Pt reports his NA meetings will begin to be in-person next week. "I'm glad because it makes me feel more present in the meeting inperson." Patient reports he is having family relationship issues with his daughter in law, communicating. Cln explored effective communication skills with patient using I-statements and  boundaries.     PLAN:Screenings Cln during session researched [place] meetings in area and emailed pt information.     Follow Up Instructions: I discussed the assessment and treatment plan with the patient. The patient was provided an opportunity to ask questions and all were answered. Thatient agreed with the plan and demonstrated an understanding of the instructions.   The patient was advised to call back or seek an in-person evaluation if the symptoms worsen or if the condition fails to improve as anticipated.  I provided 60 minutes of non-face-to-face time during this encounter.   Cylan Borum S, LCAS

## 2020-11-14 ENCOUNTER — Other Ambulatory Visit: Payer: Self-pay

## 2020-11-14 ENCOUNTER — Ambulatory Visit (INDEPENDENT_AMBULATORY_CARE_PROVIDER_SITE_OTHER): Payer: BC Managed Care – PPO | Admitting: Licensed Clinical Social Worker

## 2020-11-14 DIAGNOSIS — F1590 Other stimulant use, unspecified, uncomplicated: Secondary | ICD-10-CM | POA: Diagnosis not present

## 2020-11-14 DIAGNOSIS — F411 Generalized anxiety disorder: Secondary | ICD-10-CM | POA: Diagnosis not present

## 2020-11-14 DIAGNOSIS — F1121 Opioid dependence, in remission: Secondary | ICD-10-CM

## 2020-11-14 DIAGNOSIS — F32A Depression, unspecified: Secondary | ICD-10-CM | POA: Diagnosis not present

## 2020-11-21 ENCOUNTER — Other Ambulatory Visit: Payer: Self-pay

## 2020-11-21 ENCOUNTER — Encounter (HOSPITAL_COMMUNITY): Payer: Self-pay | Admitting: Licensed Clinical Social Worker

## 2020-11-21 ENCOUNTER — Ambulatory Visit (HOSPITAL_COMMUNITY): Payer: BC Managed Care – PPO | Admitting: Licensed Clinical Social Worker

## 2020-11-21 NOTE — Progress Notes (Signed)
Virtual Visit via Video Note  I connected with Kevin Andrade on 11/14/2020 at  9:00 AM EDT by a video enabled telemedicine application and verified that I am speaking with the correct person using two identifiers.   I discussed the limitations of evaluation and management by telemedicine and the availability of in person appointments. The patient expressed understanding and agreed to proceed.  LOCATION: Patient: Home Provider: Home Office  Therapy Goal addressed:  Stress the importance of NA meetings AEB by 3 meeting attendance per week, working with sponsor 1x per week completing step work.   History of Present Illness: Patient is referred for individual therapy after completion of CD-IOP for Opiod Use Disorder and hx of polysubstance abuse disorder, anxiety, depression. (Kratom & opiate pills)   Observations/Objective: Patient presented for today's session on time and was alert, oriented x5, with no evidence or self-report of SI/HI or A/V H.  Patient reported ongoing compliance with medication and denied any use of alcohol or illicit substances.  Clinician inquired about patient's current emotional ratings, as well as any significant changes in thoughts, feelings or behavior since previous session.  Patient reported scores of 4/10 for depression, 3/10 for anxiety, 2/10 for anger/irritability. Clinician processed with client changes over the previous week to support sobriety and build his recovery network. Clinician praised client engagement with 12 step community and meeting attendance. Patient reports he met with sponsor over the weekend and continues to work on NA traditions.Cln explored with patient the 2nd tradition. Pt had a worksheet for 2nd tradition. Cln and pt reviewed the worksheet.    PLAN:Screenings Cln during session researched [place] meetings in area and emailed pt information.     Follow Up Instructions: I discussed the assessment and treatment plan with the patient.  The patient was provided an opportunity to ask questions and all were answered. Thatient agreed with the plan and demonstrated an understanding of the instructions.   The patient was advised to call back or seek an in-person evaluation if the symptoms worsen or if the condition fails to improve as anticipated.  I provided 60 minutes of non-face-to-face time during this encounter.   Rosaura Bolon S, LCAS

## 2020-11-22 ENCOUNTER — Encounter (HOSPITAL_COMMUNITY): Payer: Self-pay | Admitting: Licensed Clinical Social Worker

## 2020-11-22 ENCOUNTER — Ambulatory Visit (INDEPENDENT_AMBULATORY_CARE_PROVIDER_SITE_OTHER): Payer: BC Managed Care – PPO | Admitting: Licensed Clinical Social Worker

## 2020-11-22 ENCOUNTER — Other Ambulatory Visit: Payer: Self-pay

## 2020-11-22 DIAGNOSIS — F411 Generalized anxiety disorder: Secondary | ICD-10-CM | POA: Diagnosis not present

## 2020-11-22 DIAGNOSIS — F32A Depression, unspecified: Secondary | ICD-10-CM

## 2020-11-22 DIAGNOSIS — F1121 Opioid dependence, in remission: Secondary | ICD-10-CM

## 2020-11-22 DIAGNOSIS — F1590 Other stimulant use, unspecified, uncomplicated: Secondary | ICD-10-CM | POA: Diagnosis not present

## 2020-11-22 NOTE — Progress Notes (Signed)
Virtual Visit via Video Note  I connected with Kevin Andrade on 11/22/2020 at  9:00 AM EDT by a video enabled telemedicine application and verified that I am speaking with the correct person using two identifiers.   I discussed the limitations of evaluation and management by telemedicine and the availability of in person appointments. The patient expressed understanding and agreed to proceed.  LOCATION: Patient: Home Provider: Home Office  Therapy Goal addressed:  Stress the importance of NA meetings AEB by 3 meeting attendance per week, working with sponsor 1x per week completing step work.   History of Present Illness: Patient is referred for individual therapy after completion of CD-IOP for Opiod Use Disorder and hx of polysubstance abuse disorder, anxiety, depression. (Kratom & opiate pills)   Observations/Objective: Patient presented for today's session on time and was alert, oriented x5, with no evidence or self-report of SI/HI or A/V H.  Patient reported ongoing compliance with medication and denied any use of alcohol or illicit substances.  Clinician inquired about patient's current emotional ratings, as well as any significant changes in thoughts, feelings or behavior since previous session.  Patient reported scores of 4/10 for depression, 3/10 for anxiety, 2/10 for anger/irritability. Pt reports on his continued family dysfunction,which increases his anxiety symptoms.Cln assessed the family dynamics. Cln provided psychoeducation on dynamics of healthy vs unhealthy relationships associated with power and control. Cln validated and normalized patient's feelings while redirecting his focus to things that he can control. Clinician processed with client changes over the previous week to support sobriety and build his recovery network. Clinician praised client engagement with 12 step community and meeting attendance. Patient reports he met with sponsor over the weekend and continues to  work on NA traditions.    PLAN:Screenings Cln during session researched [place] meetings in area and emailed pt information.     Follow Up Instructions: I discussed the assessment and treatment plan with the patient. The patient was provided an opportunity to ask questions and all were answered. Thatient agreed with the plan and demonstrated an understanding of the instructions.   The patient was advised to call back or seek an in-person evaluation if the symptoms worsen or if the condition fails to improve as anticipated.  I provided 60 minutes of non-face-to-face time during this encounter.   Adian Jablonowski S, LCAS

## 2020-11-30 ENCOUNTER — Ambulatory Visit (INDEPENDENT_AMBULATORY_CARE_PROVIDER_SITE_OTHER): Payer: BC Managed Care – PPO | Admitting: Licensed Clinical Social Worker

## 2020-11-30 ENCOUNTER — Other Ambulatory Visit: Payer: Self-pay

## 2020-11-30 ENCOUNTER — Encounter (HOSPITAL_COMMUNITY): Payer: Self-pay | Admitting: Licensed Clinical Social Worker

## 2020-11-30 DIAGNOSIS — F1590 Other stimulant use, unspecified, uncomplicated: Secondary | ICD-10-CM | POA: Diagnosis not present

## 2020-11-30 DIAGNOSIS — F411 Generalized anxiety disorder: Secondary | ICD-10-CM | POA: Diagnosis not present

## 2020-11-30 DIAGNOSIS — F1121 Opioid dependence, in remission: Secondary | ICD-10-CM | POA: Diagnosis not present

## 2020-11-30 DIAGNOSIS — F32A Depression, unspecified: Secondary | ICD-10-CM | POA: Diagnosis not present

## 2020-11-30 NOTE — Progress Notes (Signed)
Virtual Visit via Video Note  I connected with Kevin Andrade on 11/30/2020 at  9:00 AM EDT by a video enabled telemedicine application and verified that I am speaking with the correct person using two identifiers.   I discussed the limitations of evaluation and management by telemedicine and the availability of in person appointments. The patient expressed understanding and agreed to proceed.  LOCATION: Patient: Home Provider: Home Office  Therapy Goal addressed:  Stress the importance of NA meetings AEB by 3 meeting attendance per week, working with sponsor 1x per week completing step work.   History of Present Illness: Patient is referred for individual therapy after completion of CD-IOP for Opiod Use Disorder and hx of polysubstance abuse disorder, anxiety, depression. (Kratom & opiate pills)   Observations/Objective: Patient presented for today's session on time and was alert, oriented x5, with no evidence or self-report of SI/HI or A/V H.  Patient reported ongoing compliance with medication and denied any use of alcohol or illicit substances.  Clinician inquired about patient's current emotional ratings, as well as any significant changes in thoughts, feelings or behavior since previous session.  Patient reported scores of 6/10 for depression, 6/10 for anxiety, 8/10 for anger/irritability. Pt reports on his increase in symptoms due to  family dysfunction. Again, Cln assessed the family dynamics. Cln used CBT to assist patient restructuring/reframing his negative thoughts.  Cln validated and normalized patient's feelings while redirecting his focus to things that he can control. Clinician processed with client changes over the previous week to support sobriety and build his recovery network. Clinician praised client engagement with 12 step community and meeting attendance. Patient reports he met with sponsor over the weekend and continues to work on NA traditions.    PLAN:Screenings,  [place] meetings      Follow Up Instructions: I discussed the assessment and treatment plan with the patient. The patient was provided an opportunity to ask questions and all were answered. Thatient agreed with the plan and demonstrated an understanding of the instructions.   The patient was advised to call back or seek an in-person evaluation if the symptoms worsen or if the condition fails to improve as anticipated.  I provided 60 minutes of non-face-to-face time during this encounter.   Madalyne Husk S, LCAS

## 2020-12-07 ENCOUNTER — Ambulatory Visit (INDEPENDENT_AMBULATORY_CARE_PROVIDER_SITE_OTHER): Payer: BC Managed Care – PPO | Admitting: Licensed Clinical Social Worker

## 2020-12-07 ENCOUNTER — Encounter (HOSPITAL_COMMUNITY): Payer: Self-pay | Admitting: Licensed Clinical Social Worker

## 2020-12-07 ENCOUNTER — Other Ambulatory Visit: Payer: Self-pay

## 2020-12-07 DIAGNOSIS — F32A Depression, unspecified: Secondary | ICD-10-CM

## 2020-12-07 DIAGNOSIS — F411 Generalized anxiety disorder: Secondary | ICD-10-CM

## 2020-12-07 DIAGNOSIS — F1590 Other stimulant use, unspecified, uncomplicated: Secondary | ICD-10-CM

## 2020-12-07 DIAGNOSIS — F1121 Opioid dependence, in remission: Secondary | ICD-10-CM | POA: Diagnosis not present

## 2020-12-07 NOTE — Progress Notes (Signed)
Virtual Visit via Video Note  I connected with Kevin Andrade on 12/07/2020 at  9:00 AM EDT by a video enabled telemedicine application and verified that I am speaking with the correct person using two identifiers.   I discussed the limitations of evaluation and management by telemedicine and the availability of in person appointments. The patient expressed understanding and agreed to proceed.  LOCATION: Patient: Home Provider: Home Office  Therapy Goal addressed:  Stress the importance of NA meetings AEB by 3 meeting attendance per week, working with sponsor 1x per week completing step work.   History of Present Illness: Patient is referred for individual therapy after completion of CD-IOP for Opiod Use Disorder and hx of polysubstance abuse disorder, anxiety, depression. (Kratom & opiate pills)   Observations/Objective: Patient presented for today's session on time and was alert, oriented x5, with no evidence or self-report of SI/HI or A/V H.  Patient reported ongoing compliance with medication and denied any use of alcohol or illicit substances.  Clinician inquired about patient's current emotional ratings, as well as any significant changes in thoughts, feelings or behavior since previous session.  Patient reported scores of 6/10 for depression, 6/10 for anxiety, 8/10 for anger/irritability. Cln assisted pt to increase his understandings of beliefs and messages that he receives that produce worry and anxiety. Cln introdueced mindfulness skills to assist with his mixed moods. Clinician processed with client changes over the previous week to support sobriety and build his recovery network. Clinician praised client engagement with 12 step community and meeting attendance. Patient reports he met with sponsor over the weekend and os now working on Step 3. Cln and pt explored step 3.    PLAN:Screenings, [place] meetings      Follow Up Instructions: I discussed the assessment and treatment  plan with the patient. The patient was provided an opportunity to ask questions and all were answered. Thatient agreed with the plan and demonstrated an understanding of the instructions.   The patient was advised to call back or seek an in-person evaluation if the symptoms worsen or if the condition fails to improve as anticipated.  I provided 60 minutes of non-face-to-face time during this encounter.   Quinnlyn Hearns S, LCAS

## 2020-12-14 ENCOUNTER — Other Ambulatory Visit: Payer: Self-pay

## 2020-12-14 ENCOUNTER — Ambulatory Visit (INDEPENDENT_AMBULATORY_CARE_PROVIDER_SITE_OTHER): Payer: BC Managed Care – PPO | Admitting: Licensed Clinical Social Worker

## 2020-12-14 ENCOUNTER — Encounter (HOSPITAL_COMMUNITY): Payer: Self-pay | Admitting: Licensed Clinical Social Worker

## 2020-12-14 DIAGNOSIS — F1121 Opioid dependence, in remission: Secondary | ICD-10-CM

## 2020-12-14 DIAGNOSIS — F32A Depression, unspecified: Secondary | ICD-10-CM | POA: Diagnosis not present

## 2020-12-14 DIAGNOSIS — F1590 Other stimulant use, unspecified, uncomplicated: Secondary | ICD-10-CM | POA: Diagnosis not present

## 2020-12-14 DIAGNOSIS — F411 Generalized anxiety disorder: Secondary | ICD-10-CM

## 2020-12-14 NOTE — Progress Notes (Signed)
Virtual Visit via Video Note  I connected with Kevin Andrade on 12/14/2020 at  9:00 AM EDT by a video enabled telemedicine application and verified that I am speaking with the correct person using two identifiers.   I discussed the limitations of evaluation and management by telemedicine and the availability of in person appointments. The patient expressed understanding and agreed to proceed.  LOCATION: Patient: Home Provider: Home Office  Therapy Goal addressed:  Stress the importance of NA meetings AEB by 3 meeting attendance per week, working with sponsor 1x per week completing step work.   History of Present Illness: Patient is referred for individual therapy after completion of CD-IOP for Opiod Use Disorder and hx of polysubstance abuse disorder, anxiety, depression. (Kratom & opiate pills)   Observations/Objective: Patient presented for today's session on time and was alert, oriented x5, with no evidence or self-report of SI/HI or A/V H.  Patient reported ongoing compliance with medication and denied any use of alcohol or illicit substances.  Clinician inquired about patient's current emotional ratings, as well as any significant changes in thoughts, feelings or behavior since previous session.  Patient reported scores of 6/10 for depression, 6/10 for anxiety, 8/10 for anger/irritability. Pt still reports the continued strife between patient and his son/family. Pt reports the continued family dysfunction. Assessed patient's family dynamics and discussed how the dynamics influence his symptoms in a negative way and adjusting to a new normal. Clinician processed with client changes over the previous week to support sobriety and build his recovery network. Clinician praised client engagement with 12 step community and meeting attendance. Patient reports he met with sponsor over the weekend and os now working on Step 3. Cln and pt explored step 3.    PLAN:Screenings, [place] meetings       Follow Up Instructions: I discussed the assessment and treatment plan with the patient. The patient was provided an opportunity to ask questions and all were answered. Thatient agreed with the plan and demonstrated an understanding of the instructions.   The patient was advised to call back or seek an in-person evaluation if the symptoms worsen or if the condition fails to improve as anticipated.  I provided 60 minutes of non-face-to-face time during this encounter.   , S, LCAS   

## 2020-12-21 ENCOUNTER — Encounter (HOSPITAL_COMMUNITY): Payer: Self-pay | Admitting: Licensed Clinical Social Worker

## 2020-12-21 ENCOUNTER — Ambulatory Visit (INDEPENDENT_AMBULATORY_CARE_PROVIDER_SITE_OTHER): Payer: BC Managed Care – PPO | Admitting: Licensed Clinical Social Worker

## 2020-12-21 ENCOUNTER — Other Ambulatory Visit: Payer: Self-pay

## 2020-12-21 DIAGNOSIS — F1590 Other stimulant use, unspecified, uncomplicated: Secondary | ICD-10-CM

## 2020-12-21 DIAGNOSIS — F411 Generalized anxiety disorder: Secondary | ICD-10-CM

## 2020-12-21 DIAGNOSIS — F1121 Opioid dependence, in remission: Secondary | ICD-10-CM

## 2020-12-21 DIAGNOSIS — F32A Depression, unspecified: Secondary | ICD-10-CM

## 2020-12-21 NOTE — Progress Notes (Signed)
Virtual Visit via Video Note  I connected with Kevin Andrade on 12/21/2020 at  9:00 AM EDT by a video enabled telemedicine application and verified that I am speaking with the correct person using two identifiers.   I discussed the limitations of evaluation and management by telemedicine and the availability of in person appointments. The patient expressed understanding and agreed to proceed.  LOCATION: Patient: Home Provider: Home Office  Therapy Goal addressed:  Stress the importance of NA meetings AEB by 3 meeting attendance per week, working with sponsor 1x per week completing step work.   History of Present Illness: Patient is referred for individual therapy after completion of CD-IOP for Opiod Use Disorder and hx of polysubstance abuse disorder, anxiety, depression. (Kratom & opiate pills)   Observations/Objective: Patient presented for today's session on time and was alert, oriented x5, with no evidence or self-report of SI/HI or A/V H.  Patient reported ongoing compliance with medication and denied any use of alcohol or illicit substances.  Clinician inquired about patient's current emotional ratings, as well as any significant changes in thoughts, feelings or behavior since previous session.  Patient reported scores of 6/10 for depression, 6/10 for anxiety, 6/10 for anger/irritability. Pt presented sick with allergies today. Pt still reports continued family drama but he is planning ways to go forward with his/wife's life without son and his family, for now. They went out and bought a 2nd motorcycle to take weekend trips. Validated the self care he is practicing. Clinician processed with client changes over the previous week to support sobriety and build his recovery network. Clinician praised client engagement with 12 step community and meeting attendance. Patient went to a new in-person meeting with his sponsor. Pt described his emotions about going to a new meeting. Patient reports  he met with sponsor over the weekend and os now working on Step 3. "I'm struggling with step 3." Cln and pt explored step 3.    PLAN:Screenings, [place] meetings      Follow Up Instructions: I discussed the assessment and treatment plan with the patient. The patient was provided an opportunity to ask questions and all were answered. Thatient agreed with the plan and demonstrated an understanding of the instructions.   The patient was advised to call back or seek an in-person evaluation if the symptoms worsen or if the condition fails to improve as anticipated.  I provided 45 minutes of non-face-to-face time during this encounter.   Karyl Sharrar S, LCAS

## 2020-12-26 ENCOUNTER — Ambulatory Visit (INDEPENDENT_AMBULATORY_CARE_PROVIDER_SITE_OTHER): Payer: BC Managed Care – PPO | Admitting: Licensed Clinical Social Worker

## 2020-12-26 ENCOUNTER — Encounter (HOSPITAL_COMMUNITY): Payer: Self-pay | Admitting: Licensed Clinical Social Worker

## 2020-12-26 ENCOUNTER — Other Ambulatory Visit: Payer: Self-pay

## 2020-12-26 DIAGNOSIS — F411 Generalized anxiety disorder: Secondary | ICD-10-CM

## 2020-12-26 DIAGNOSIS — F32A Depression, unspecified: Secondary | ICD-10-CM | POA: Diagnosis not present

## 2020-12-26 DIAGNOSIS — F1121 Opioid dependence, in remission: Secondary | ICD-10-CM | POA: Diagnosis not present

## 2020-12-26 DIAGNOSIS — F1590 Other stimulant use, unspecified, uncomplicated: Secondary | ICD-10-CM | POA: Diagnosis not present

## 2020-12-26 NOTE — Progress Notes (Signed)
Virtual Visit via Video Note  I connected with Kevin Andrade on 12/26/2020 at  9:00 AM EDT by a video enabled telemedicine application and verified that I am speaking with the correct person using two identifiers.   I discussed the limitations of evaluation and management by telemedicine and the availability of in person appointments. The patient expressed understanding and agreed to proceed.  LOCATION: Patient: Home Provider: Home Office  Therapy Goal addressed:  Stress the importance of NA meetings AEB by 3 meeting attendance per week, working with sponsor 1x per week completing step work.   History of Present Illness: Patient is referred for individual therapy after completion of CD-IOP for Opiod Use Disorder and hx of polysubstance abuse disorder, anxiety, depression. (Kratom & opiate pills)   Observations/Objective: Patient presented for today's session on time and was alert, oriented x5, with no evidence or self-report of SI/HI or A/V H.  Patient reported ongoing compliance with medication and denied any use of alcohol or illicit substances.  Clinician inquired about patient's current emotional ratings, as well as any significant changes in thoughts, feelings or behavior since previous session.  Patient reported scores of 6/10 for depression, 6/10 for anxiety, 6/10 for anger/irritability. Pt still reports continued family drama which has increased within the last week.Clinician allowed space for patient to further process his emotions and clinician provided supportive statements throughout session for him.   Clinician processed with client changes over the previous week to support sobriety and build his recovery network. Clinician praised client engagement with 12 step community and meeting attendance. Patient went to 4  in-person meetinga with his sponsor over the weekend, 2 new ones. Pt described his emotions about going to a new meeting. Patient reports he met with sponsor over the  weekend and continues working on Step 3, and continues to struggle with step 3. Cln and pt explored step 3, discussing the struggle.   PLAN:Screenings, [place] meetings      Follow Up Instructions: I discussed the assessment and treatment plan with the patient. The patient was provided an opportunity to ask questions and all were answered. Thatient agreed with the plan and demonstrated an understanding of the instructions.   The patient was advised to call back or seek an in-person evaluation if the symptoms worsen or if the condition fails to improve as anticipated.  I provided 60 minutes of non-face-to-face time during this encounter.   Hazelgrace Bonham S, LCAS

## 2020-12-28 ENCOUNTER — Ambulatory Visit (HOSPITAL_COMMUNITY): Payer: BC Managed Care – PPO | Admitting: Licensed Clinical Social Worker

## 2020-12-30 ENCOUNTER — Telehealth (HOSPITAL_COMMUNITY): Payer: Self-pay | Admitting: *Deleted

## 2020-12-30 NOTE — Telephone Encounter (Signed)
Pt called requesting a refill of Trintellix 20mg . Pt has an upcoming appointment on 01/19/21. Thanks.

## 2021-01-02 ENCOUNTER — Encounter (HOSPITAL_COMMUNITY): Payer: Self-pay | Admitting: Licensed Clinical Social Worker

## 2021-01-02 ENCOUNTER — Other Ambulatory Visit: Payer: Self-pay

## 2021-01-02 ENCOUNTER — Ambulatory Visit (INDEPENDENT_AMBULATORY_CARE_PROVIDER_SITE_OTHER): Payer: BC Managed Care – PPO | Admitting: Licensed Clinical Social Worker

## 2021-01-02 DIAGNOSIS — F411 Generalized anxiety disorder: Secondary | ICD-10-CM

## 2021-01-02 DIAGNOSIS — F32A Depression, unspecified: Secondary | ICD-10-CM | POA: Diagnosis not present

## 2021-01-02 DIAGNOSIS — F1590 Other stimulant use, unspecified, uncomplicated: Secondary | ICD-10-CM

## 2021-01-02 DIAGNOSIS — F1121 Opioid dependence, in remission: Secondary | ICD-10-CM

## 2021-01-02 NOTE — Progress Notes (Addendum)
Virtual Visit via Video Note  I connected with Kevin Andrade on 01/02/2021 at  9:00 AM EDT by a video enabled telemedicine application and verified that I am speaking with the correct person using two identifiers.   I discussed the limitations of evaluation and management by telemedicine and the availability of in person appointments. The patient expressed understanding and agreed to proceed.  LOCATION: Patient: Home Provider: Home Office  Therapy Goal addressed:  Stress the importance of NA meetings AEB by 3 meeting attendance per week, working with sponsor 1x per week completing step work.   History of Present Illness: Patient is referred for individual therapy after completion of CD-IOP for Opiod Use Disorder and hx of polysubstance abuse disorder, anxiety, depression. (Kratom & opiate pills)   Observations/Objective: Patient presented for today's session on time and was alert, oriented x5, with no evidence or self-report of SI/HI or A/V H.  Patient reported ongoing compliance with medication and denied any use of alcohol or illicit substances.  Clinician inquired about patient's current emotional ratings, as well as any significant changes in thoughts, feelings or behavior since previous session.  Patient reported scores of 6/10 for depression, 6/10 for anxiety, 6/10 for anger/irritability. CLn reviewed tx plan with patient who verbalized acceptance of the plan. Pt still reports continued family drama which has affected his moods within the last week. Clinician processed with client changes over the previous week to support sobriety and build his recovery network. Clinician praised client engagement with 12 step community and meeting attendance. Patient went to 3 in-person meetinga with his sponsor over the weekend. Patient reports he met with sponsor over the weekend and continues to struggle with Step 3. Cln and pt explored step 3, discussing the struggle. "It's hard for me to turn it  over, losing control. I'm still on step 3."   PLAN:Screenings     Follow Up Instructions: I discussed the assessment and treatment plan with the patient. The patient was provided an opportunity to ask questions and all were answered. Thatient agreed with the plan and demonstrated an understanding of the instructions.   The patient was advised to call back or seek an in-person evaluation if the symptoms worsen or if the condition fails to improve as anticipated.  I provided 60 minutes of non-face-to-face time during this encounter.   Dylin Ihnen S, LCAS

## 2021-01-02 NOTE — Telephone Encounter (Signed)
ok 

## 2021-01-02 NOTE — Telephone Encounter (Signed)
Pt rx not due til 5/25 ?Please contact to see why he is needing mwedication 16 days early Thank you

## 2021-01-09 ENCOUNTER — Ambulatory Visit (HOSPITAL_COMMUNITY): Payer: BC Managed Care – PPO | Admitting: Licensed Clinical Social Worker

## 2021-01-09 ENCOUNTER — Other Ambulatory Visit: Payer: Self-pay

## 2021-01-10 ENCOUNTER — Encounter (HOSPITAL_COMMUNITY): Payer: Self-pay | Admitting: Licensed Clinical Social Worker

## 2021-01-10 ENCOUNTER — Other Ambulatory Visit: Payer: Self-pay

## 2021-01-10 ENCOUNTER — Ambulatory Visit (INDEPENDENT_AMBULATORY_CARE_PROVIDER_SITE_OTHER): Payer: BC Managed Care – PPO | Admitting: Licensed Clinical Social Worker

## 2021-01-10 DIAGNOSIS — F411 Generalized anxiety disorder: Secondary | ICD-10-CM | POA: Diagnosis not present

## 2021-01-10 DIAGNOSIS — F1590 Other stimulant use, unspecified, uncomplicated: Secondary | ICD-10-CM | POA: Diagnosis not present

## 2021-01-10 DIAGNOSIS — F32A Depression, unspecified: Secondary | ICD-10-CM | POA: Diagnosis not present

## 2021-01-10 DIAGNOSIS — F1121 Opioid dependence, in remission: Secondary | ICD-10-CM

## 2021-01-10 NOTE — Progress Notes (Signed)
Virtual Visit via Video Note  I connected with Kevin Andrade on 01/10/2021 at 10:00 AM EDT by a video enabled telemedicine application and verified that I am speaking with the correct person using two identifiers.   I discussed the limitations of evaluation and management by telemedicine and the availability of in person appointments. The patient expressed understanding and agreed to proceed.  LOCATION: Patient: Home Provider: Home Office  Therapy Goal addressed:  Stress the importance of NA meetings AEB by 3 meeting attendance per week, working with sponsor 1x per week completing step work.   History of Present Illness: Patient is referred for individual therapy after completion of CD-IOP for Opiod Use Disorder and hx of polysubstance abuse disorder, anxiety, depression. (Kratom & opiate pills)   Observations/Objective: Patient presented for today's session on time and was alert, oriented x5, with no evidence or self-report of SI/HI or A/V H.  Patient reported ongoing compliance with medication and denied any use of alcohol or illicit substances.  Clinician inquired about patient's current emotional ratings, as well as any significant changes in thoughts, feelings or behavior since previous session.  Patient reported scores of 6/10 for depression, 6/10 for anxiety, 6/10 for anger/irritability. Pt reports on his sobriety and meeting attendance. During the call, he received a call that his father in law car broke down and had to leave the session to go pick him up.    PLAN:Screenings     Follow Up Instructions: I discussed the assessment and treatment plan with the patient. The patient was provided an opportunity to ask questions and all were answered. Thatient agreed with the plan and demonstrated an understanding of the instructions.   The patient was advised to call back or seek an in-person evaluation if the symptoms worsen or if the condition fails to improve as anticipated.  I  provided 30 minutes of non-face-to-face time during this encounter.   Syrianna Schillaci S, LCAS

## 2021-01-16 ENCOUNTER — Encounter (HOSPITAL_COMMUNITY): Payer: Self-pay | Admitting: Licensed Clinical Social Worker

## 2021-01-16 ENCOUNTER — Other Ambulatory Visit: Payer: Self-pay

## 2021-01-16 ENCOUNTER — Ambulatory Visit (INDEPENDENT_AMBULATORY_CARE_PROVIDER_SITE_OTHER): Payer: BC Managed Care – PPO | Admitting: Licensed Clinical Social Worker

## 2021-01-16 DIAGNOSIS — F1590 Other stimulant use, unspecified, uncomplicated: Secondary | ICD-10-CM | POA: Diagnosis not present

## 2021-01-16 DIAGNOSIS — F1121 Opioid dependence, in remission: Secondary | ICD-10-CM

## 2021-01-16 DIAGNOSIS — F411 Generalized anxiety disorder: Secondary | ICD-10-CM | POA: Diagnosis not present

## 2021-01-16 DIAGNOSIS — F32A Depression, unspecified: Secondary | ICD-10-CM

## 2021-01-16 NOTE — Progress Notes (Signed)
  Virtual Visit via Video Note  I connected with Kevin Andrade on 01/16/2021 at  9:00 AM EDT by a video enabled telemedicine application and verified that I am speaking with the correct person using two identifiers.   I discussed the limitations of evaluation and management by telemedicine and the availability of in person appointments. The patient expressed understanding and agreed to proceed.  LOCATION: Patient: Home Provider: Home Office  Therapy Goal addressed:  Stress the importance of NA meetings AEB by 3 meeting attendance per week, working with sponsor 1x per week completing step work.   History of Present Illness: Patient is referred for individual therapy after completion of CD-IOP for Opiod Use Disorder and hx of polysubstance abuse disorder, anxiety, depression. (Kratom & opiate pills)   Observations/Objective: Patient presented for today's session on time and was alert, oriented x5, with no evidence or self-report of SI/HI or A/V H.  Patient reported ongoing compliance with medication and denied any use of alcohol or illicit substances.  Clinician inquired about patient's current emotional ratings, as well as any significant changes in thoughts, feelings or behavior since previous session.  Patient reported scores of 6/10 for depression, 6/10 for anxiety, 6/10 for anger/irritability. Cln processed with client changes over the previous week to support sobriety and build his recovery network. Clinician praised client engagement with 12 step community and meeting attendance. Patient went to 3 in-person meetinga with his sponsor over the weekend. Patient reports he met with sponsor over the weekend and continues to struggle with Step 3. Pt reports he is frustrated with his sponsor because "he is trying to push religion on me." Cln explored NA step 3 with patient, talking about God as a higher power, strategized with pt he can choose his higher power. Cln and pt explored higher power.  Cln role played with pt talking to his sponsor about religion.      PLAN:Screenings     Follow Up Instructions: I discussed the assessment and treatment plan with the patient. The patient was provided an opportunity to ask questions and all were answered. Thatient agreed with the plan and demonstrated an understanding of the instructions.   The patient was advised to call back or seek an in-person evaluation if the symptoms worsen or if the condition fails to improve as anticipated.  I provided 60 minutes of non-face-to-face time during this encounter.   Zaire Levesque S, LCAS

## 2021-01-19 ENCOUNTER — Telehealth (INDEPENDENT_AMBULATORY_CARE_PROVIDER_SITE_OTHER): Payer: BC Managed Care – PPO | Admitting: Medical

## 2021-01-19 ENCOUNTER — Other Ambulatory Visit: Payer: Self-pay

## 2021-01-19 ENCOUNTER — Encounter (HOSPITAL_COMMUNITY): Payer: Self-pay | Admitting: Medical

## 2021-01-19 DIAGNOSIS — Z811 Family history of alcohol abuse and dependence: Secondary | ICD-10-CM

## 2021-01-19 DIAGNOSIS — T7492XS Unspecified child maltreatment, confirmed, sequela: Secondary | ICD-10-CM

## 2021-01-19 DIAGNOSIS — Z639 Problem related to primary support group, unspecified: Secondary | ICD-10-CM

## 2021-01-19 DIAGNOSIS — F17200 Nicotine dependence, unspecified, uncomplicated: Secondary | ICD-10-CM

## 2021-01-19 DIAGNOSIS — F1421 Cocaine dependence, in remission: Secondary | ICD-10-CM

## 2021-01-19 DIAGNOSIS — F1021 Alcohol dependence, in remission: Secondary | ICD-10-CM

## 2021-01-19 DIAGNOSIS — F1590 Other stimulant use, unspecified, uncomplicated: Secondary | ICD-10-CM

## 2021-01-19 DIAGNOSIS — F341 Dysthymic disorder: Secondary | ICD-10-CM

## 2021-01-19 DIAGNOSIS — F4312 Post-traumatic stress disorder, chronic: Secondary | ICD-10-CM

## 2021-01-19 DIAGNOSIS — F1121 Opioid dependence, in remission: Secondary | ICD-10-CM

## 2021-01-19 DIAGNOSIS — F419 Anxiety disorder, unspecified: Secondary | ICD-10-CM

## 2021-01-19 MED ORDER — VORTIOXETINE HBR 20 MG PO TABS: 20.0000 mg | ORAL_TABLET | Freq: Every day | ORAL | 1 refills | Status: AC

## 2021-01-19 MED ORDER — PREGABALIN 150 MG PO CAPS: 150.0000 mg | ORAL_CAPSULE | Freq: Three times a day (TID) | ORAL | 5 refills | Status: DC

## 2021-01-19 MED ORDER — NALTREXONE HCL 50 MG PO TABS: 50.0000 mg | ORAL_TABLET | Freq: Every day | ORAL | 1 refills | Status: DC
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
## 2021-01-23 DIAGNOSIS — L237 Allergic contact dermatitis due to plants, except food: Secondary | ICD-10-CM | POA: Diagnosis not present

## 2021-01-26 ENCOUNTER — Other Ambulatory Visit: Payer: Self-pay

## 2021-01-26 ENCOUNTER — Ambulatory Visit (INDEPENDENT_AMBULATORY_CARE_PROVIDER_SITE_OTHER): Payer: BC Managed Care – PPO | Admitting: Licensed Clinical Social Worker

## 2021-01-26 ENCOUNTER — Encounter (HOSPITAL_COMMUNITY): Payer: Self-pay | Admitting: Licensed Clinical Social Worker

## 2021-01-26 DIAGNOSIS — F419 Anxiety disorder, unspecified: Secondary | ICD-10-CM

## 2021-01-26 DIAGNOSIS — F1590 Other stimulant use, unspecified, uncomplicated: Secondary | ICD-10-CM | POA: Diagnosis not present

## 2021-01-26 DIAGNOSIS — F1121 Opioid dependence, in remission: Secondary | ICD-10-CM

## 2021-01-26 DIAGNOSIS — F32A Depression, unspecified: Secondary | ICD-10-CM | POA: Diagnosis not present

## 2021-01-26 NOTE — Progress Notes (Signed)
  Virtual Visit via Video Note  I connected with Kevin Andrade on 01/26/2021 at 10:00 AM EDT by a video enabled telemedicine application and verified that I am speaking with the correct person using two identifiers.   I discussed the limitations of evaluation and management by telemedicine and the availability of in person appointments. The patient expressed understanding and agreed to proceed.  LOCATION: Patient: Home Provider: Home Office  Therapy Goal addressed:  Stress the importance of NA meetings AEB by 3 meeting attendance per week, working with sponsor 1x per week completing step work.   History of Present Illness: Patient is referred for individual therapy after completion of CD-IOP for Opiod Use Disorder and hx of polysubstance abuse disorder, anxiety, depression. (Kratom & opiate pills)   Observations/Objective: Patient presented for today's session on time and was alert, oriented x5, with no evidence or self-report of SI/HI or A/V H.  Patient reported ongoing compliance with medication and denied any use of alcohol or illicit substances.  Clinician inquired about patient's current emotional ratings, as well as any significant changes in thoughts, feelings or behavior since previous session.  Patient reported scores of 5/10 for depression, 5/10 for anxiety, 3/10 for anger/irritability. Cln processed with client changes over the previous week to support sobriety and build his recovery network. Clinician praised client engagement with 12 step community and meeting attendance. Patient went to 3 in-person meetinga with his sponsor over the weekend. Patient reports he met with sponsor over the weekend and continues to struggle with Step 3. Pt reports he met with Deedra Ehrich, PA for medication management, where he discussed Step 3 with him and his sponsor pushing religion.  Again, Cln explored NA step 3 with patient, talking about God as a higher power, strategized with pt he can choose his  higher power. Cln and pt explored higher power, his higher power.       PLAN:Screenings     Follow Up Instructions: I discussed the assessment and treatment plan with the patient. The patient was provided an opportunity to ask questions and all were answered. Thatient agreed with the plan and demonstrated an understanding of the instructions.   The patient was advised to call back or seek an in-person evaluation if the symptoms worsen or if the condition fails to improve as anticipated.  I provided 60 minutes of non-face-to-face time during this encounter.   Nesha Counihan S, LCAS

## 2021-01-30 ENCOUNTER — Other Ambulatory Visit: Payer: Self-pay

## 2021-01-30 ENCOUNTER — Encounter (HOSPITAL_COMMUNITY): Payer: Self-pay | Admitting: Licensed Clinical Social Worker

## 2021-01-30 ENCOUNTER — Ambulatory Visit (HOSPITAL_COMMUNITY): Payer: BC Managed Care – PPO | Admitting: Licensed Clinical Social Worker

## 2021-01-30 ENCOUNTER — Ambulatory Visit (INDEPENDENT_AMBULATORY_CARE_PROVIDER_SITE_OTHER): Payer: BC Managed Care – PPO | Admitting: Licensed Clinical Social Worker

## 2021-01-30 DIAGNOSIS — F419 Anxiety disorder, unspecified: Secondary | ICD-10-CM | POA: Diagnosis not present

## 2021-01-30 DIAGNOSIS — F1121 Opioid dependence, in remission: Secondary | ICD-10-CM

## 2021-01-30 DIAGNOSIS — F32A Depression, unspecified: Secondary | ICD-10-CM | POA: Diagnosis not present

## 2021-01-30 DIAGNOSIS — F1590 Other stimulant use, unspecified, uncomplicated: Secondary | ICD-10-CM | POA: Diagnosis not present

## 2021-01-30 NOTE — Progress Notes (Signed)
  Virtual Visit via Video Note  I connected with Kevin Andrade on 01/30/2021 at  9:00 AM EDT by a video enabled telemedicine application and verified that I am speaking with the correct person using two identifiers.   I discussed the limitations of evaluation and management by telemedicine and the availability of in person appointments. The patient expressed understanding and agreed to proceed.  LOCATION: Patient: Home Provider: Home Office  Therapy Goal addressed:  Stress the importance of NA meetings AEB by 3 meeting attendance per week, working with sponsor 1x per week completing step work.   History of Present Illness: Patient is referred for individual therapy after completion of CD-IOP for Opiod Use Disorder and hx of polysubstance abuse disorder, anxiety, depression. (Kratom & opiate pills)   Observations/Objective: Patient presented for today's session on time and was alert, oriented x5, with no evidence or self-report of SI/HI or A/V H.  Patient reported ongoing compliance with medication and denied any use of alcohol or illicit substances.  Clinician inquired about patient's current emotional ratings, as well as any significant changes in thoughts, feelings or behavior since previous session.  Patient reported scores of 5/10 for depression, 5/10 for anxiety, 4/10 for anger/irritability. Cln processed with client changes over the previous week to support sobriety and build his recovery network. Clinician praised client engagement with 12 step community and meeting attendance. Patient went to 3 in-person meetings with his sponsor over the weekend. Patient reports he met with sponsor over the weekend and continues to work on Step 3. Cln suggested pt bring up his struggles with Step 3 in his NA meetings. "I brought up  My Step 3 struggles in my NA meeting and I got some good feedback." Pt had some good questions about his higher power. Cln and pt explored step 3. Cln used socratic  questions, reflective listening.       PLAN:Screenings     Follow Up Instructions: I discussed the assessment and treatment plan with the patient. The patient was provided an opportunity to ask questions and all were answered. Thatient agreed with the plan and demonstrated an understanding of the instructions.   The patient was advised to call back or seek an in-person evaluation if the symptoms worsen or if the condition fails to improve as anticipated.  I provided 60 minutes of non-face-to-face time during this encounter.   Elisha Mcgruder S, LCAS

## 2021-02-06 ENCOUNTER — Ambulatory Visit (HOSPITAL_COMMUNITY): Payer: BC Managed Care – PPO | Admitting: Licensed Clinical Social Worker

## 2021-02-06 ENCOUNTER — Other Ambulatory Visit (HOSPITAL_COMMUNITY): Payer: Self-pay | Admitting: Medical

## 2021-02-13 ENCOUNTER — Ambulatory Visit (INDEPENDENT_AMBULATORY_CARE_PROVIDER_SITE_OTHER): Payer: BC Managed Care – PPO | Admitting: Licensed Clinical Social Worker

## 2021-02-13 ENCOUNTER — Encounter (HOSPITAL_COMMUNITY): Payer: Self-pay | Admitting: Licensed Clinical Social Worker

## 2021-02-13 ENCOUNTER — Other Ambulatory Visit: Payer: Self-pay

## 2021-02-13 DIAGNOSIS — F32A Depression, unspecified: Secondary | ICD-10-CM | POA: Diagnosis not present

## 2021-02-13 DIAGNOSIS — F1121 Opioid dependence, in remission: Secondary | ICD-10-CM | POA: Diagnosis not present

## 2021-02-13 DIAGNOSIS — F419 Anxiety disorder, unspecified: Secondary | ICD-10-CM

## 2021-02-13 DIAGNOSIS — F1590 Other stimulant use, unspecified, uncomplicated: Secondary | ICD-10-CM

## 2021-02-13 NOTE — Progress Notes (Signed)
  Virtual Visit via Video Note  I connected with Kevin Andrade on 02/13/2021 at  9:00 AM EDT by a video enabled telemedicine application and verified that I am speaking with the correct person using two identifiers.   I discussed the limitations of evaluation and management by telemedicine and the availability of in person appointments. The patient expressed understanding and agreed to proceed.  LOCATION: Patient: Home Provider: Home Office  Therapy Goal addressed:  Stress the importance of NA meetings AEB by 3 meeting attendance per week, working with sponsor 1x per week completing step work.   History of Present Illness: Patient is referred for individual therapy after completion of CD-IOP for Opiod Use Disorder and hx of polysubstance abuse disorder, anxiety, depression. (Kratom & opiate pills)   Observations/Objective: Patient presented for today's session on time and was alert, oriented x5, with no evidence or self-report of SI/HI or A/V H.  Patient reported ongoing compliance with medication and denied any use of alcohol or illicit substances.  Clinician inquired about patient's current emotional ratings, as well as any significant changes in thoughts, feelings or behavior since previous session.  Patient reported scores of 5/10 for depression, 5/10 for anxiety, 4/10 for anger/irritability. Patient was tearful in his description of his father's day. "My son did not call/text me." Cln used socratic questions, reflective listening. Cln suggested he bring this up in his next NA meeting. Pt reports "that makes me nervous."  Cln and pt explored talking to members of NA for feedback. Cln completed GAD-7 and reviewed each question/answer individually with discussion and coping skills discussed. Patient did not go to any meetings over the weekend because he was on vacation, but will pick back up this week.    Follow Up Instructions: I discussed the assessment and treatment plan with the  patient. The patient was provided an opportunity to ask questions and all were answered. Thatient agreed with the plan and demonstrated an understanding of the instructions.   The patient was advised to call back or seek an in-person evaluation if the symptoms worsen or if the condition fails to improve as anticipated.  I provided 45 minutes of non-face-to-face time during this encounter.   Chalonda Schlatter S, LCAS

## 2021-02-20 ENCOUNTER — Other Ambulatory Visit: Payer: Self-pay

## 2021-02-20 ENCOUNTER — Ambulatory Visit (INDEPENDENT_AMBULATORY_CARE_PROVIDER_SITE_OTHER): Payer: BC Managed Care – PPO | Admitting: Licensed Clinical Social Worker

## 2021-02-20 ENCOUNTER — Encounter (HOSPITAL_COMMUNITY): Payer: Self-pay | Admitting: Licensed Clinical Social Worker

## 2021-02-20 DIAGNOSIS — F419 Anxiety disorder, unspecified: Secondary | ICD-10-CM | POA: Diagnosis not present

## 2021-02-20 DIAGNOSIS — F32A Depression, unspecified: Secondary | ICD-10-CM

## 2021-02-20 DIAGNOSIS — F1121 Opioid dependence, in remission: Secondary | ICD-10-CM

## 2021-02-20 DIAGNOSIS — F1590 Other stimulant use, unspecified, uncomplicated: Secondary | ICD-10-CM

## 2021-02-20 NOTE — Progress Notes (Signed)
  Virtual Visit via Video Note  I connected with Kevin Andrade on 02/20/2021 at  9:00 AM EDT by a video enabled telemedicine application and verified that I am speaking with the correct person using two identifiers.   I discussed the limitations of evaluation and management by telemedicine and the availability of in person appointments. The patient expressed understanding and agreed to proceed.  LOCATION: Patient: Home Provider: Home Office  Therapy Goal addressed:  Stress the importance of NA meetings AEB by 3 meeting attendance per week, working with sponsor 1x per week completing step work.   History of Present Illness: Patient is referred for individual therapy after completion of CD-IOP for Opiod Use Disorder and hx of polysubstance abuse disorder, anxiety, depression. (Kratom & opiate pills)   Observations/Objective: Patient presented for today's session on time and was alert, oriented x5, with no evidence or self-report of SI/HI or A/V H.  Patient reported ongoing compliance with medication and denied any use of alcohol or illicit substances.  Clinician inquired about patient's current emotional ratings, as well as any significant changes in thoughts, feelings or behavior since previous session.  Patient reported scores of 5/10 for depression, 5/10 for anxiety, 4/10 for anger/irritability. Patient was excited to share he got to spend time with his grandchildren, however his wife was not included. Patient is struggling with the exclusion of his wife. "I feel like I've been using and keeping it secret from my wife." Cln used socratic questions. "I'm concerned it's going to affect my sobriety. I talked to my sponsor and the AA meeting last night to get feedback." Cln assessed family dynamics processing power struggles within extended family.      Follow Up Instructions: I discussed the assessment and treatment plan with the patient. The patient was provided an opportunity to ask  questions and all were answered. The patient agreed with the plan and demonstrated an understanding of the instructions.   The patient was advised to call back or seek an in-person evaluation if the symptoms worsen or if the condition fails to improve as anticipated.  I provided 45 minutes of non-face-to-face time during this encounter.   Kenyetta Wimbish S, LCAS

## 2021-02-28 ENCOUNTER — Ambulatory Visit (HOSPITAL_COMMUNITY): Payer: BC Managed Care – PPO | Admitting: Licensed Clinical Social Worker

## 2021-02-28 ENCOUNTER — Other Ambulatory Visit: Payer: Self-pay

## 2021-03-06 ENCOUNTER — Ambulatory Visit (INDEPENDENT_AMBULATORY_CARE_PROVIDER_SITE_OTHER): Payer: BC Managed Care – PPO | Admitting: Licensed Clinical Social Worker

## 2021-03-06 ENCOUNTER — Other Ambulatory Visit: Payer: Self-pay

## 2021-03-06 ENCOUNTER — Encounter (HOSPITAL_COMMUNITY): Payer: Self-pay | Admitting: Licensed Clinical Social Worker

## 2021-03-06 DIAGNOSIS — F1121 Opioid dependence, in remission: Secondary | ICD-10-CM

## 2021-03-06 DIAGNOSIS — F1590 Other stimulant use, unspecified, uncomplicated: Secondary | ICD-10-CM | POA: Diagnosis not present

## 2021-03-06 DIAGNOSIS — F419 Anxiety disorder, unspecified: Secondary | ICD-10-CM | POA: Diagnosis not present

## 2021-03-06 DIAGNOSIS — F32A Depression, unspecified: Secondary | ICD-10-CM | POA: Diagnosis not present

## 2021-03-06 NOTE — Progress Notes (Signed)
  Virtual Visit via Video Note  I connected with Kevin Andrade on 03/06/2021 at  9:00 AM EDT by a video enabled telemedicine application and verified that I am speaking with the correct person using two identifiers.   I discussed the limitations of evaluation and management by telemedicine and the availability of in person appointments. The patient expressed understanding and agreed to proceed.  LOCATION: Patient: Home Provider: Home Office  Therapy Goal addressed:  Stress the importance of NA meetings AEB by 3 meeting attendance per week, working with sponsor 1x per week completing step work.   History of Present Illness: Patient is referred for individual therapy after completion of CD-IOP for Opiod Use Disorder and hx of polysubstance abuse disorder, anxiety, depression. (Kratom & opiate pills)   Observations/Objective: Patient presented for today's session on time and was alert, oriented x5, with no evidence or self-report of SI/HI or A/V H.  Patient reported ongoing compliance with medication and denied any use of alcohol or illicit substances.  Clinician inquired about patient's current emotional ratings, as well as any significant changes in thoughts, feelings or behavior since previous session.  Patient reported scores of 5/10 for depression, 5/10 for anxiety, 4/10 for anger/irritability. Patient still continues to have family issues with his son and his family. Clinician utilized MI OARS to reflect and summarize thoughts and feelings. Clinician explored with pt his concerns. Cln processed with client changes over the previous week to support sobriety and build his recovery network. Clinician praised client engagement with 12 step community and meeting attendance. Patient went to 3 in-person meetings with his sponsor over the weekend. Patient reports he met with sponsor Saturday and continues to work on Step 3. "I think I'm beginning to accept step 3."  Cln assessed family dynamics  processing power struggles within extended family.      Follow Up Instructions: I discussed the assessment and treatment plan with the patient. The patient was provided an opportunity to ask questions and all were answered. The patient agreed with the plan and demonstrated an understanding of the instructions.   The patient was advised to call back or seek an in-person evaluation if the symptoms worsen or if the condition fails to improve as anticipated.  I provided 45 minutes of non-face-to-face time during this encounter.   Lois Ostrom S, LCAS

## 2021-03-13 ENCOUNTER — Ambulatory Visit (INDEPENDENT_AMBULATORY_CARE_PROVIDER_SITE_OTHER): Payer: BC Managed Care – PPO | Admitting: Licensed Clinical Social Worker

## 2021-03-13 ENCOUNTER — Other Ambulatory Visit: Payer: Self-pay

## 2021-03-13 DIAGNOSIS — F419 Anxiety disorder, unspecified: Secondary | ICD-10-CM | POA: Diagnosis not present

## 2021-03-13 DIAGNOSIS — F32A Depression, unspecified: Secondary | ICD-10-CM

## 2021-03-13 DIAGNOSIS — F1121 Opioid dependence, in remission: Secondary | ICD-10-CM | POA: Diagnosis not present

## 2021-03-13 DIAGNOSIS — F1590 Other stimulant use, unspecified, uncomplicated: Secondary | ICD-10-CM | POA: Diagnosis not present

## 2021-03-16 ENCOUNTER — Encounter (HOSPITAL_COMMUNITY): Payer: Self-pay | Admitting: Licensed Clinical Social Worker

## 2021-03-16 NOTE — Progress Notes (Signed)
  Virtual Visit via Video Note  I connected with Kevin Andrade on 03/13/2021 at  9:00 AM EDT by a video enabled telemedicine application and verified that I am speaking with the correct person using two identifiers.   I discussed the limitations of evaluation and management by telemedicine and the availability of in person appointments. The patient expressed understanding and agreed to proceed.  LOCATION: Patient: Home Provider: Home Office  Therapy Goal addressed:  Stress the importance of NA meetings AEB by 3 meeting attendance per week, working with sponsor 1x per week completing step work.   History of Present Illness: Patient is referred for individual therapy after completion of CD-IOP for Opiod Use Disorder and hx of polysubstance abuse disorder, anxiety, depression. (Kratom & opiate pills)   Observations/Objective: Patient presented for today's session on time and was alert, oriented x5, with no evidence or self-report of SI/HI or A/V H.  Patient reported ongoing compliance with medication and denied any use of alcohol or illicit substances.  Clinician inquired about patient's current emotional ratings, as well as any significant changes in thoughts, feelings or behavior since previous session.  Patient reported scores of 5/10 for depression, 5/10 for anxiety, 4/10 for anger/irritability. Patient still continues to have family issues with his son and his family, which he says is now affecting his sobriety. Clinician explored with pt his concerns, suggesting pt go to more meetings during the week, talk about the family issues at the meetings and talk to his sponsor about it. Cln encouraged pt to get more phone #s from members of the group.Cln processed with client changes over the previous week to support sobriety and build his recovery network. Clinician praised client engagement with 12 step community and meeting attendance. Patient went to 3 in-person meetings with his sponsor over  the weekend. Patient reports he met with sponsor Saturday. Cln assessed the family dynamics. Cln provided psychoeducation on dynamics of healthy vs unhealthy family relationships associated with power and control. Cln validated and normalized patient's feelings while redirecting his focus to things that he can control.       Follow Up Instructions: I discussed the assessment and treatment plan with the patient. The patient was provided an opportunity to ask questions and all were answered. The patient agreed with the plan and demonstrated an understanding of the instructions.   The patient was advised to call back or seek an in-person evaluation if the symptoms worsen or if the condition fails to improve as anticipated.  I provided 60 minutes of non-face-to-face time during this encounter.   Laren Whaling S, LCAS

## 2021-03-20 ENCOUNTER — Encounter (HOSPITAL_COMMUNITY): Payer: Self-pay | Admitting: Licensed Clinical Social Worker

## 2021-03-20 ENCOUNTER — Other Ambulatory Visit: Payer: Self-pay

## 2021-03-20 ENCOUNTER — Ambulatory Visit (INDEPENDENT_AMBULATORY_CARE_PROVIDER_SITE_OTHER): Payer: BC Managed Care – PPO | Admitting: Licensed Clinical Social Worker

## 2021-03-20 DIAGNOSIS — F1121 Opioid dependence, in remission: Secondary | ICD-10-CM | POA: Diagnosis not present

## 2021-03-20 DIAGNOSIS — F1590 Other stimulant use, unspecified, uncomplicated: Secondary | ICD-10-CM

## 2021-03-20 DIAGNOSIS — F419 Anxiety disorder, unspecified: Secondary | ICD-10-CM | POA: Diagnosis not present

## 2021-03-20 DIAGNOSIS — F32A Depression, unspecified: Secondary | ICD-10-CM | POA: Diagnosis not present

## 2021-03-20 NOTE — Progress Notes (Signed)
  Virtual Visit via Video Note  I connected with Kevin Andrade on 03/20/2021 at  9:00 AM EDT by a video enabled telemedicine application and verified that I am speaking with the correct person using two identifiers.   I discussed the limitations of evaluation and management by telemedicine and the availability of in person appointments. The patient expressed understanding and agreed to proceed.  LOCATION: Patient: Home Provider: Home Office  Therapy Goal addressed:  Stress the importance of NA meetings AEB by 3 meeting attendance per week, working with sponsor 1x per week completing step work.   History of Present Illness: Patient is referred for individual therapy after completion of CD-IOP for Opiod Use Disorder and hx of polysubstance abuse disorder, anxiety, depression. (Kratom & opiate pills)   Observations/Objective: Patient presented for today'Andrade session on time and was alert, oriented x5, with no evidence or self-report of SI/HI or A/V H.  Patient reported ongoing compliance with medication and denied any use of alcohol or illicit substances.  Clinician inquired about patient'Andrade current emotional ratings, as well as any significant changes in thoughts, feelings or behavior since previous session.  Patient reported scores of 5/10 for depression, 5/10 for anxiety, 4/10 for anger/irritability. Patient still continues to have family issues with his son and his family, however "I've let it go and turning it over to my HP." This led to a discussion of Step 3. Pt completed worksheet on step 3. Cln reviewed worksheet discussing Step 3. Cln processed with client changes over the previous week to support sobriety and build his recovery network. Clinician praised client engagement with 12 step community and meeting attendance. Patient went to 3 in-person meetings with his sponsor over the weekend. Patient reports he met with sponsor Saturday, continuing to work on step 3.       Follow Up  Instructions: I discussed the assessment and treatment plan with the patient. The patient was provided an opportunity to ask questions and all were answered. The patient agreed with the plan and demonstrated an understanding of the instructions.   The patient was advised to call back or seek an in-person evaluation if the symptoms worsen or if the condition fails to improve as anticipated.  I provided 45 minutes of non-face-to-face time during this encounter.   Kevin Andrade, LCAS

## 2021-03-27 ENCOUNTER — Other Ambulatory Visit: Payer: Self-pay

## 2021-03-27 ENCOUNTER — Encounter (HOSPITAL_COMMUNITY): Payer: Self-pay | Admitting: Licensed Clinical Social Worker

## 2021-03-27 ENCOUNTER — Ambulatory Visit (INDEPENDENT_AMBULATORY_CARE_PROVIDER_SITE_OTHER): Payer: BC Managed Care – PPO | Admitting: Licensed Clinical Social Worker

## 2021-03-27 DIAGNOSIS — F1121 Opioid dependence, in remission: Secondary | ICD-10-CM

## 2021-03-27 DIAGNOSIS — F1590 Other stimulant use, unspecified, uncomplicated: Secondary | ICD-10-CM | POA: Diagnosis not present

## 2021-03-27 DIAGNOSIS — F419 Anxiety disorder, unspecified: Secondary | ICD-10-CM | POA: Diagnosis not present

## 2021-03-27 DIAGNOSIS — F32A Depression, unspecified: Secondary | ICD-10-CM | POA: Diagnosis not present

## 2021-03-27 NOTE — Progress Notes (Signed)
  Virtual Visit via Video Note  I connected with Kevin Andrade on 03/20/2021 at  9:00 AM EDT by a video enabled telemedicine application and verified that I am speaking with the correct person using two identifiers.   I discussed the limitations of evaluation and management by telemedicine and the availability of in person appointments. The patient expressed understanding and agreed to proceed.  LOCATION: Patient: Home Provider: Home Office  Therapy Goal addressed:  Stress the importance of NA meetings AEB by 3 meeting attendance per week, working with sponsor 1x per week completing step work.   History of Present Illness: Patient is referred for individual therapy after completion of CD-IOP for Opiod Use Disorder and hx of polysubstance abuse disorder, anxiety, depression. (Kratom & opiate pills)   Observations/Objective: Patient presented for today's session on time and was alert, oriented x5, with no evidence or self-report of SI/HI or A/V H.  Patient reported ongoing compliance with medication and denied any use of alcohol or illicit substances.  Clinician inquired about patient's current emotional ratings, as well as any significant changes in thoughts, feelings or behavior since previous session.  Patient reported scores of 6/10 for depression, 5/10 for anxiety, 3/10 for anger/irritability. Pt discussed his weekend where his wife was depressed because of the family issues. Cln and pt explored NAR-ANON and IOP for pt. 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 Cln processed with client changes over the previous week to support sobriety and build his recovery network. Clinician praised client engagement with 12 step community and meeting attendance. Patient went to 3 in-person meetings with his sponsor over the  weekend. Patient reports he met with sponsor Saturday, beginning to work on step 4.       Follow Up Instructions: I discussed the assessment and treatment plan with the patient. The patient was provided an opportunity to ask questions and all were answered. The patient agreed with the plan and demonstrated an understanding of the instructions.   The patient was advised to call back or seek an in-person evaluation if the symptoms worsen or if the condition fails to improve as anticipated.  I provided 60 minutes of non-face-to-face time during this encounter.   Venus Ruhe S, LCAS

## 2021-04-04 ENCOUNTER — Other Ambulatory Visit: Payer: Self-pay

## 2021-04-04 ENCOUNTER — Ambulatory Visit (HOSPITAL_COMMUNITY): Payer: BC Managed Care – PPO | Admitting: Licensed Clinical Social Worker

## 2021-04-05 ENCOUNTER — Other Ambulatory Visit: Payer: Self-pay

## 2021-04-05 ENCOUNTER — Ambulatory Visit (HOSPITAL_COMMUNITY): Payer: BC Managed Care – PPO | Admitting: Licensed Clinical Social Worker

## 2021-04-10 ENCOUNTER — Encounter (HOSPITAL_COMMUNITY): Payer: Self-pay | Admitting: Licensed Clinical Social Worker

## 2021-04-10 ENCOUNTER — Other Ambulatory Visit: Payer: Self-pay

## 2021-04-10 ENCOUNTER — Ambulatory Visit (INDEPENDENT_AMBULATORY_CARE_PROVIDER_SITE_OTHER): Payer: BC Managed Care – PPO | Admitting: Licensed Clinical Social Worker

## 2021-04-10 DIAGNOSIS — F32A Depression, unspecified: Secondary | ICD-10-CM | POA: Diagnosis not present

## 2021-04-10 DIAGNOSIS — F1590 Other stimulant use, unspecified, uncomplicated: Secondary | ICD-10-CM

## 2021-04-10 DIAGNOSIS — F419 Anxiety disorder, unspecified: Secondary | ICD-10-CM | POA: Diagnosis not present

## 2021-04-10 DIAGNOSIS — F1121 Opioid dependence, in remission: Secondary | ICD-10-CM | POA: Diagnosis not present

## 2021-04-10 NOTE — Progress Notes (Signed)
  Virtual Visit via Video Note  I connected with Kevin Andrade on 04/10/2021 at  9:00 AM EDT by a video enabled telemedicine application and verified that I am speaking with the correct person using two identifiers.   I discussed the limitations of evaluation and management by telemedicine and the availability of in person appointments. The patient expressed understanding and agreed to proceed.  LOCATION: Patient: Home Provider: Home Office  Therapy Goal addressed:  Stress the importance of NA meetings AEB by 3 meeting attendance per week, working with sponsor 1x per week completing step work.   History of Present Illness: Patient is referred for individual therapy after completion of CD-IOP for Opiod Use Disorder and hx of polysubstance abuse disorder, anxiety, depression. (Kratom & opiate pills)   Observations/Objective: Patient presented for today'Andrade session on time and was alert, oriented x5, with no evidence or self-report of SI/HI or A/V H.  Patient reported ongoing compliance with medication and denied any use of alcohol or illicit substances.  Clinician inquired about patient'Andrade current emotional ratings, as well as any significant changes in thoughts, feelings or behavior since previous session.  Patient reported scores of 6/10 for depression, 5/10 for anxiety, 3/10 for anger/irritability. Pt discussed his vacation weekend where he and his wife went to the beach for a few days. "This was good for managing my mood and coping with my daily stressors." Patient reports he continues working on step 4, "making a moral inventory of ourself."  Cln and pt reviewed moral inventory. Cln processed with client changes over the previous week to support sobriety and build his recovery network. Clinician praised client engagement with 12 step community and meeting attendance. Patient went to 3 in-person meetings with his sponsor over the weekend. Patient reports he met with sponsor at his usual  Saturday meeting, working on IT sales professional. Cln emailed step 4 worksheets. Cln and pt reviewed the worksheets briefly, will continue this at next session.       Follow Up Instructions: I discussed the assessment and treatment plan with the patient. The patient was provided an opportunity to ask questions and all were answered. The patient agreed with the plan and demonstrated an understanding of the instructions.   The patient was advised to call back or seek an in-person evaluation if the symptoms worsen or if the condition fails to improve as anticipated.  I provided 60 minutes of non-face-to-face time during this encounter.   Kevin Andrade, LCAS

## 2021-04-17 ENCOUNTER — Encounter (HOSPITAL_COMMUNITY): Payer: Self-pay | Admitting: Licensed Clinical Social Worker

## 2021-04-17 ENCOUNTER — Ambulatory Visit (INDEPENDENT_AMBULATORY_CARE_PROVIDER_SITE_OTHER): Payer: BC Managed Care – PPO | Admitting: Licensed Clinical Social Worker

## 2021-04-17 ENCOUNTER — Other Ambulatory Visit: Payer: Self-pay

## 2021-04-17 DIAGNOSIS — F1590 Other stimulant use, unspecified, uncomplicated: Secondary | ICD-10-CM

## 2021-04-17 DIAGNOSIS — F32A Depression, unspecified: Secondary | ICD-10-CM | POA: Diagnosis not present

## 2021-04-17 DIAGNOSIS — F419 Anxiety disorder, unspecified: Secondary | ICD-10-CM

## 2021-04-17 DIAGNOSIS — F1121 Opioid dependence, in remission: Secondary | ICD-10-CM | POA: Diagnosis not present

## 2021-04-17 NOTE — Progress Notes (Addendum)
  Virtual Visit via Video Note  I connected with Kevin Andrade on 04/17/2021 at  9:00 AM EDT by a video enabled telemedicine application and verified that I am speaking with the correct person using two identifiers.   I discussed the limitations of evaluation and management by telemedicine and the availability of in person appointments. The patient expressed understanding and agreed to proceed.  LOCATION: Patient: Home Provider: Home Office  Therapy Goal addressed:  Stress the importance of NA meetings AEB by 3 meeting attendance per week, working with sponsor 1x per week completing step work.   History of Present Illness: Patient is referred for individual therapy after completion of CD-IOP for Opiod Use Disorder and hx of polysubstance abuse disorder, anxiety, depression. (Kratom & opiate pills)   Observations/Objective: Patient presented for today's session on time and was alert, oriented x5, with no evidence or self-report of SI/HI or A/V H.  Patient reported ongoing compliance with medication and denied any use of alcohol or illicit substances.  Clinician inquired about patient's current emotional ratings, as well as any significant changes in thoughts, feelings or behavior since previous session.  Patient reported scores of 6/10 for depression, 5/10 for anxiety, 3/10 for anger/irritability. Pt presented with low mood. Cln used socratic questions. Pt discussed his continued stressors: family relationships. Clinician utilized MI OARS to affirm concerns and challenged his thoughts.  Clinician processed options for communicating his concerns to his son. Cln reviewed tx plan with pt who verbalized acceptance of the plan. 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 Cln processed with client changes over the previous week to support sobriety and build his recovery network. Clinician praised client engagement with  12 step community and meeting attendance. Patient went to 3 in-person meetings with his sponsor over the weekend. Patient reports he met with sponsor at his usual Saturday meeting, working on IT sales professional. Cln and pt reviewed the worksheets discussing resentments.   Follow Up Instructions: I discussed the assessment and treatment plan with the patient. The patient was provided an opportunity to ask questions and all were answered. The patient agreed with the plan and demonstrated an understanding of the instructions.   The patient was advised to call back or seek an in-person evaluation if the symptoms worsen or if the condition fails to improve as anticipated.  I provided 60 minutes of non-face-to-face time during this encounter.   , S, LCAS

## 2021-04-20 ENCOUNTER — Ambulatory Visit (INDEPENDENT_AMBULATORY_CARE_PROVIDER_SITE_OTHER): Payer: BC Managed Care – PPO | Admitting: Family Medicine

## 2021-04-20 ENCOUNTER — Encounter: Payer: Self-pay | Admitting: Family Medicine

## 2021-04-20 ENCOUNTER — Other Ambulatory Visit: Payer: Self-pay

## 2021-04-20 VITALS — BP 132/88 | HR 72 | Temp 98.1°F | Ht 73.0 in | Wt 273.4 lb

## 2021-04-20 DIAGNOSIS — R5383 Other fatigue: Secondary | ICD-10-CM | POA: Diagnosis not present

## 2021-04-20 DIAGNOSIS — Z1159 Encounter for screening for other viral diseases: Secondary | ICD-10-CM

## 2021-04-20 DIAGNOSIS — B354 Tinea corporis: Secondary | ICD-10-CM | POA: Diagnosis not present

## 2021-04-20 DIAGNOSIS — E042 Nontoxic multinodular goiter: Secondary | ICD-10-CM | POA: Diagnosis not present

## 2021-04-20 DIAGNOSIS — R030 Elevated blood-pressure reading, without diagnosis of hypertension: Secondary | ICD-10-CM

## 2021-04-20 DIAGNOSIS — F321 Major depressive disorder, single episode, moderate: Secondary | ICD-10-CM

## 2021-04-20 DIAGNOSIS — B372 Candidiasis of skin and nail: Secondary | ICD-10-CM | POA: Diagnosis not present

## 2021-04-20 DIAGNOSIS — Z0000 Encounter for general adult medical examination without abnormal findings: Secondary | ICD-10-CM

## 2021-04-20 DIAGNOSIS — Z13220 Encounter for screening for lipoid disorders: Secondary | ICD-10-CM | POA: Diagnosis not present

## 2021-04-20 DIAGNOSIS — F411 Generalized anxiety disorder: Secondary | ICD-10-CM

## 2021-04-20 DIAGNOSIS — Z6837 Body mass index (BMI) 37.0-37.9, adult: Secondary | ICD-10-CM

## 2021-04-20 LAB — CBC WITH DIFFERENTIAL/PLATELET
Basophils Absolute: 0.1 10*3/uL (ref 0.0–0.1)
Basophils Relative: 0.9 % (ref 0.0–3.0)
Eosinophils Absolute: 0.1 10*3/uL (ref 0.0–0.7)
Eosinophils Relative: 1.9 % (ref 0.0–5.0)
HCT: 43.4 % (ref 39.0–52.0)
Hemoglobin: 14.8 g/dL (ref 13.0–17.0)
Lymphocytes Relative: 20.7 % (ref 12.0–46.0)
Lymphs Abs: 1.6 10*3/uL (ref 0.7–4.0)
MCHC: 34.1 g/dL (ref 30.0–36.0)
MCV: 90.4 fl (ref 78.0–100.0)
Monocytes Absolute: 0.7 10*3/uL (ref 0.1–1.0)
Monocytes Relative: 9.5 % (ref 3.0–12.0)
Neutro Abs: 5.1 10*3/uL (ref 1.4–7.7)
Neutrophils Relative %: 67 % (ref 43.0–77.0)
Platelets: 239 10*3/uL (ref 150.0–400.0)
RBC: 4.8 Mil/uL (ref 4.22–5.81)
RDW: 14.5 % (ref 11.5–15.5)
WBC: 7.6 10*3/uL (ref 4.0–10.5)

## 2021-04-20 LAB — COMPREHENSIVE METABOLIC PANEL
ALT: 24 U/L (ref 0–53)
AST: 19 U/L (ref 0–37)
Albumin: 4.5 g/dL (ref 3.5–5.2)
Alkaline Phosphatase: 73 U/L (ref 39–117)
BUN: 14 mg/dL (ref 6–23)
CO2: 28 mEq/L (ref 19–32)
Calcium: 9.7 mg/dL (ref 8.4–10.5)
Chloride: 105 mEq/L (ref 96–112)
Creatinine, Ser: 1.32 mg/dL (ref 0.40–1.50)
GFR: 63.48 mL/min (ref >60.00)
Glucose, Bld: 55 mg/dL — ABNORMAL LOW (ref 70–99)
Potassium: 4.3 mEq/L (ref 3.5–5.1)
Sodium: 142 mEq/L (ref 135–145)
Total Bilirubin: 0.4 mg/dL (ref 0.2–1.2)
Total Protein: 7 g/dL (ref 6.0–8.3)

## 2021-04-20 LAB — LIPID PANEL
Cholesterol: 181 mg/dL (ref 0–200)
HDL: 52.4 mg/dL (ref >39.00)
LDL Cholesterol: 105 mg/dL — ABNORMAL HIGH (ref 0–99)
NonHDL: 128.71
Total CHOL/HDL Ratio: 3
Triglycerides: 119 mg/dL (ref 0.0–149.0)
VLDL: 23.8 mg/dL (ref 0.0–40.0)

## 2021-04-20 LAB — VITAMIN D 25 HYDROXY (VIT D DEFICIENCY, FRACTURES): VITD: 46.06 ng/mL (ref 30.00–100.00)

## 2021-04-20 LAB — T4, FREE: Free T4: 0.8 ng/dL (ref 0.60–1.60)

## 2021-04-20 LAB — TSH: TSH: 1.44 u[IU]/mL (ref 0.35–5.50)

## 2021-04-20 LAB — HEMOGLOBIN A1C: Hgb A1c MFr Bld: 6 % (ref 4.6–6.5)

## 2021-04-20 LAB — VITAMIN B12: Vitamin B-12: 368 pg/mL (ref 211–911)

## 2021-04-20 MED ORDER — TERBINAFINE HCL 1 % EX CREA: 1.0000 "application " | TOPICAL_CREAM | Freq: Two times a day (BID) | CUTANEOUS | 1 refills | Status: DC

## 2021-04-20 MED ORDER — NYSTATIN 100000 UNIT/GM EX CREA: 1.0000 "application " | TOPICAL_CREAM | Freq: Two times a day (BID) | CUTANEOUS | 0 refills | Status: DC

## 2021-04-20 NOTE — Progress Notes (Signed)
Subjective:     Kevin Andrade is a 50 y.o. male and is here for a comprehensive physical exam. The patient reports problems - , depression, anxiety symptoms .  Patient last seen over a year ago for infection fingertip of right second digit.  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  Patient doing well since surgery.  Patient endorses history of addiction.  Currently in weekly counseling at [facility] and seeing psychiatrist.  Patient notes increase in anxiety and depression symptoms dealing with family and work stress.  Patient notes appetite and sleep up and down.  Patient endorses several skin lesions.  Large lesion on left forearm has increased in size and is at times pruritic.  Patient also endorses increased sweating while working for [employer].  Patient often develops a rash on his butt.  Tried Diflucan from his wife which helped for about 2 weeks.  Patient inquires about repeat thyroid As multinodular goiter noted several years ago.  Patient endorses fatigue, decreased libido, having to push himself daily to get through activities. Social History   Socioeconomic History   Marital status: Married    Spouse name: [name]   Number of children: 2   Years of education: HS   Highest education level: Not on file  Occupational History   Not on file  Tobacco Use   Smoking status: Every Day    Packs/day: 2.00    Years: 20.00    Pack years: 40.00    Types: Cigarettes   Smokeless tobacco: Former  Building services engineer Use: Never used  Substance and Sexual Activity   Alcohol use: No   Drug use: No   Sexual activity: Yes  Other Topics Concern   Not on file  Social History Narrative   Patient lives at home with spouse.   Caffeine Use: 2 L daily   Social Determinants of Health   Financial Resource Strain: Not on file  Food Insecurity: Not on file  Transportation Needs: Not on file  Physical Activity: Not on file  Stress: Not on file  Social Connections: Not on file   Intimate Partner Violence: Not on file   Health Maintenance  Topic Date Due   HIV Screening  Never done   Hepatitis C Screening  Never done   Pneumococcal Vaccine 50-50 Years old (2 - PCV) 09/02/2015   COLONOSCOPY (Pts 45-69yrs Insurance coverage will need to be confirmed)  Never done   COVID-19 Vaccine (3 - Booster for Moderna series) 04/20/2020   INFLUENZA VACCINE  03/27/2021   TETANUS/TDAP  09/01/2024   HPV VACCINES  Aged Out    The following portions of the patient's history were reviewed and updated as appropriate: allergies, current medications, past family history, past medical history, past social history, past surgical history, and problem list.  Review of Systems Pertinent items noted in HPI and remainder of comprehensive ROS otherwise negative.   Objective:    BP 132/88 (BP Location: Right Arm, Patient Position: Sitting, Cuff Size: Large)   Pulse 72   Temp 98.1 F (36.7 C) (Oral)   Ht 6\' 1"  (1.854 m)   Wt 273 lb 6.4 oz (124 kg)   SpO2 95%   BMI 36.07 kg/m  General appearance: alert, cooperative, and no distress Head: Normocephalic, without obvious abnormality, atraumatic Eyes: conjunctivae/corneas clear. PERRL, EOM's intact. Fundi benign. Ears:  External ears normal.  R canal occluded with cerumen.  L TM normal. Nose: Nares normal. Septum midline. Mucosa normal. No drainage or  sinus tenderness. Throat: lips, mucosa, and tongue normal; teeth and gums normal Neck: no adenopathy, no carotid bruit, no JVD, supple, symmetrical, trachea midline, and thyroid not enlarged, symmetric, no tenderness/mass/nodules Lungs: clear to auscultation bilaterally Heart: regular rate and rhythm, S1, S2 normal, no murmur, click, rub or gallop Abdomen: soft, non-tender; bowel sounds normal; no masses,  no organomegaly Extremities: extremities normal, atraumatic, no cyanosis or edema. Amputation of right second digit at DIP well-healed. Pulses: 2+ and symmetric Skin:  Warm, dry, intact.   4 cm circumscribed erythematous, dry lesion with rolled edges on left anterior proximal forearm , several 1 cm circular erythematous and dry appearing lesions on left ankle. Lymph nodes: Cervical, supraclavicular, and axillary nodes normal. Neurologic: Alert and oriented X 3, normal strength and tone. Normal symmetric reflexes. Normal coordination and gait    Assessment:    Healthy male exam with several skin lesions, worsening anxiety and depression, and fatigue.   Plan:    Anticipatory guidance given including wearing seatbelts, smoke detectors in the home, increasing physical activity, increasing p.o. intake of water and vegetables. -Obtain labs -Colonoscopy due -Given handout -Next encounter See After Visit Summary for Counseling Recommendations   GAD (generalized anxiety disorder)  -GAD-7 score 18 -Continue current meds -Patient encouraged to reach out to [facility] counselor and psychiatry for sooner appointment -Continue weekly counseling sessions - Plan: TSH, T4, Free  Depression, major, single episode, moderate ([name]) -Worsening -PHQ-9 score 19 -Patient encouraged to reach out to counselor and psychiatry for sooner appointment. -Continue current medications -Given strict precautions - Plan: TSH, T4, Free  Tinea corporis  - Plan: terbinafine (LAMISIL AT) 1 % cream  Yeast dermatitis  -Moisture wicking fabrics -Encouraged to change out of wet clothes when able - Plan: nystatin cream (MYCOSTATIN)  Fatigue, unspecified type -Increasing -Anxiety/depression symptoms likely contributing.  Also consider thyroid dysfunction Deficiency -Obtain labs - Plan: CBC with Differential/Platelet, TSH, T4, Free, Hemoglobin A1c, CMP, Vitamin D, 25-hydroxy, Vitamin B12  Multinodular goiter   Plan: TSH, T4, Free, US THYROID  Screening for cholesterol level -Lifestyle modifications - Plan: Lipid panel  Encounter for hepatitis C screening test for low risk patient - Plan: Hep C  Antibody  Class 2 severe obesity due to excess calories with serious comorbidity and body mass index (BMI) of 37.0 to 37.9 in adult [facility]) -Discussed the importance of lifestyle modifications -Increase physical activity and decrease portion sizes  Elevated blood pressure reading without diagnosis of hypertension -Decrease sodium intake and increase p.o. intake of water -Recheck in 1 month, sooner if needed  Follow-up in 1 month  Abbe Amsterdam, MD

## 2021-04-21 LAB — HEPATITIS C ANTIBODY
Hepatitis C Ab: NONREACTIVE
SIGNAL TO CUT-OFF: 0.03 (ref <1.00)

## 2021-04-24 ENCOUNTER — Other Ambulatory Visit: Payer: Self-pay

## 2021-04-24 ENCOUNTER — Encounter (HOSPITAL_COMMUNITY): Payer: Self-pay | Admitting: Licensed Clinical Social Worker

## 2021-04-24 ENCOUNTER — Ambulatory Visit (INDEPENDENT_AMBULATORY_CARE_PROVIDER_SITE_OTHER): Payer: BC Managed Care – PPO | Admitting: Licensed Clinical Social Worker

## 2021-04-24 DIAGNOSIS — F1121 Opioid dependence, in remission: Secondary | ICD-10-CM | POA: Diagnosis not present

## 2021-04-24 DIAGNOSIS — F419 Anxiety disorder, unspecified: Secondary | ICD-10-CM

## 2021-04-24 DIAGNOSIS — F1590 Other stimulant use, unspecified, uncomplicated: Secondary | ICD-10-CM

## 2021-04-24 DIAGNOSIS — F32A Depression, unspecified: Secondary | ICD-10-CM | POA: Diagnosis not present

## 2021-04-24 NOTE — Progress Notes (Signed)
  Virtual Visit via Video Note  I connected with Kevin Andrade on 04/24/2021 at  9:00 AM EDT by a video enabled telemedicine application and verified that I am speaking with the correct person using two identifiers.   I discussed the limitations of evaluation and management by telemedicine and the availability of in person appointments. The patient expressed understanding and agreed to proceed.  LOCATION: Patient: Home Provider: Home Office  Therapy Goal addressed:  Stress the importance of NA meetings AEB by 3 meeting attendance per week, working with sponsor 1x per week completing step work.   History of Present Illness: Patient is referred for individual therapy after completion of CD-IOP for Opiod Use Disorder and hx of polysubstance abuse disorder, anxiety, depression. (Kratom & opiate pills)   Observations/Objective: Patient presented for today's session on time and was alert, oriented x5, with no evidence or self-report of SI/HI or A/V H.  Patient reported ongoing compliance with medication and denied any use of alcohol or illicit substances.  Clinician inquired about patient's current emotional ratings, as well as any significant changes in thoughts, feelings or behavior since previous session.  Patient reported scores of 6/10 for depression, 6/10 for anxiety, 3/10 for anger/irritability. Pt presented with low mood. Cln used socratic questions. Pt discussed his continued stressors: family relationships. Clinician utilized MI OARS to reflect and summarize thoughts and feelings about the family relationship. 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 Cln processed with client changes over the previous week to support sobriety and build his recovery network. Clinician praised client engagement with 12 step community and meeting attendance. Patient went to 3 in-person meetings with his sponsor over the  weekend. Patient reports he met with sponsor at his usual Saturday meeting, working on IT sales professional. Cln and pt reviewed the 4th step worksheet.   Follow Up Instructions: I discussed the assessment and treatment plan with the patient. The patient was provided an opportunity to ask questions and all were answered. The patient agreed with the plan and demonstrated an understanding of the instructions.   The patient was advised to call back or seek an in-person evaluation if the symptoms worsen or if the condition fails to improve as anticipated.  I provided 60 minutes of non-face-to-face time during this encounter.   Isella Slatten S, LCAS

## 2021-04-25 ENCOUNTER — Encounter (INDEPENDENT_AMBULATORY_CARE_PROVIDER_SITE_OTHER): Payer: Self-pay

## 2021-04-26 ENCOUNTER — Ambulatory Visit
Admission: RE | Admit: 2021-04-26 | Discharge: 2021-04-26 | Disposition: A | Discharge status: Home / Self Care | Payer: BC Managed Care – PPO | Source: Ambulatory Visit | Attending: Family Medicine | Admitting: Family Medicine

## 2021-04-26 ENCOUNTER — Other Ambulatory Visit: Payer: Self-pay

## 2021-04-26 DIAGNOSIS — E042 Nontoxic multinodular goiter: Secondary | ICD-10-CM

## 2021-04-26 DIAGNOSIS — E041 Nontoxic single thyroid nodule: Secondary | ICD-10-CM | POA: Diagnosis not present

## 2021-05-02 ENCOUNTER — Other Ambulatory Visit: Payer: Self-pay

## 2021-05-02 ENCOUNTER — Ambulatory Visit (HOSPITAL_COMMUNITY): Payer: BC Managed Care – PPO | Admitting: Licensed Clinical Social Worker

## 2021-05-03 ENCOUNTER — Ambulatory Visit (HOSPITAL_COMMUNITY): Payer: BC Managed Care – PPO | Admitting: Licensed Clinical Social Worker

## 2021-05-03 ENCOUNTER — Other Ambulatory Visit: Payer: Self-pay

## 2021-05-08 ENCOUNTER — Ambulatory Visit (INDEPENDENT_AMBULATORY_CARE_PROVIDER_SITE_OTHER): Payer: BC Managed Care – PPO | Admitting: Licensed Clinical Social Worker

## 2021-05-08 ENCOUNTER — Encounter (HOSPITAL_COMMUNITY): Payer: Self-pay | Admitting: Licensed Clinical Social Worker

## 2021-05-08 ENCOUNTER — Other Ambulatory Visit: Payer: Self-pay

## 2021-05-08 DIAGNOSIS — F1121 Opioid dependence, in remission: Secondary | ICD-10-CM

## 2021-05-08 DIAGNOSIS — F419 Anxiety disorder, unspecified: Secondary | ICD-10-CM

## 2021-05-08 DIAGNOSIS — F32A Depression, unspecified: Secondary | ICD-10-CM

## 2021-05-08 DIAGNOSIS — F1590 Other stimulant use, unspecified, uncomplicated: Secondary | ICD-10-CM | POA: Diagnosis not present

## 2021-05-08 NOTE — Progress Notes (Signed)
  Virtual Visit via Phone Note  I connected with Kevin Andrade on 05/08/2021 at  9:00 AM EDT by a phone enabled telemedicine application and verified that I am speaking with the correct person using two identifiers.   I discussed the limitations of evaluation and management by telemedicine and the availability of in person appointments. The patient expressed understanding and agreed to proceed.  LOCATION: Patient: Home Provider: Home Office  Therapy Goal addressed:  Stress the importance of NA meetings AEB by 3 meeting attendance per week, working with sponsor 1x per week completing step work.   History of Present Illness: Patient is referred for individual therapy after completion of CD-IOP for Opiod Use Disorder and hx of polysubstance abuse disorder, anxiety, depression. (Kratom & opiate pills)   Observations/Objective: Patient presented for today's session on time and was alert, oriented x5, with no evidence or self-report of SI/HI or A/V H.  Patient reported ongoing compliance with medication and denied any use of alcohol or illicit substances.  Clinician inquired about patient's current emotional ratings, as well as any significant changes in thoughts, feelings or behavior since previous session.  Patient reported scores of 6/10 for depression, 6/10 for anxiety, 3/10 for anger/irritability. 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 Cln processed with client changes over the previous week to support sobriety and build his recovery network. Clinician praised client engagement with 12 step community and meeting attendance. Patient went to 3 in-person meetings with his sponsor over the weekend. Patient reports he met with sponsor at his usual Saturday meeting, working on IT sales professional. Cln and pt reviewed the 4th step worksheets focusing on self esteem. Cln provided psychoeducation on self esteem. Cln and pt  explored his childhood formation of low self-esteem.   Follow Up Instructions: I discussed the assessment and treatment plan with the patient. The patient was provided an opportunity to ask questions and all were answered. The patient agreed with the plan and demonstrated an understanding of the instructions.   The patient was advised to call back or seek an in-person evaluation if the symptoms worsen or if the condition fails to improve as anticipated.  I provided 60 minutes of non-face-to-face time during this encounter.   Rodnesha Elie S, LCAS

## 2021-05-15 ENCOUNTER — Other Ambulatory Visit: Payer: Self-pay

## 2021-05-15 ENCOUNTER — Encounter (HOSPITAL_COMMUNITY): Payer: Self-pay | Admitting: Licensed Clinical Social Worker

## 2021-05-15 ENCOUNTER — Ambulatory Visit (INDEPENDENT_AMBULATORY_CARE_PROVIDER_SITE_OTHER): Payer: BC Managed Care – PPO | Admitting: Licensed Clinical Social Worker

## 2021-05-15 DIAGNOSIS — F419 Anxiety disorder, unspecified: Secondary | ICD-10-CM

## 2021-05-15 DIAGNOSIS — F32A Depression, unspecified: Secondary | ICD-10-CM | POA: Diagnosis not present

## 2021-05-15 DIAGNOSIS — F1590 Other stimulant use, unspecified, uncomplicated: Secondary | ICD-10-CM | POA: Diagnosis not present

## 2021-05-15 DIAGNOSIS — F1121 Opioid dependence, in remission: Secondary | ICD-10-CM | POA: Diagnosis not present
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
## 2021-05-22 ENCOUNTER — Ambulatory Visit (HOSPITAL_COMMUNITY): Payer: BC Managed Care – PPO | Admitting: Licensed Clinical Social Worker

## 2021-05-29 ENCOUNTER — Other Ambulatory Visit: Payer: Self-pay

## 2021-05-29 ENCOUNTER — Ambulatory Visit (INDEPENDENT_AMBULATORY_CARE_PROVIDER_SITE_OTHER): Payer: BC Managed Care – PPO | Admitting: Licensed Clinical Social Worker

## 2021-05-29 ENCOUNTER — Encounter (HOSPITAL_COMMUNITY): Payer: Self-pay | Admitting: Licensed Clinical Social Worker

## 2021-05-29 DIAGNOSIS — F1121 Opioid dependence, in remission: Secondary | ICD-10-CM

## 2021-05-29 DIAGNOSIS — F32A Depression, unspecified: Secondary | ICD-10-CM

## 2021-05-29 DIAGNOSIS — F419 Anxiety disorder, unspecified: Secondary | ICD-10-CM

## 2021-05-29 DIAGNOSIS — F1590 Other stimulant use, unspecified, uncomplicated: Secondary | ICD-10-CM | POA: Diagnosis not present

## 2021-05-29 NOTE — Progress Notes (Signed)
  Virtual Visit via Phone Note  I connected with Kevin Andrade on 05/29/2021 at  9:00 AM EDT by a phone enabled telemedicine application and verified that I am speaking with the correct person using two identifiers.   I discussed the limitations of evaluation and management by telemedicine and the availability of in person appointments. The patient expressed understanding and agreed to proceed.  LOCATION: Patient: Home Provider: Home Office  Therapy Goal addressed:  Stress the importance of NA meetings AEB by 3 meeting attendance per week, working with sponsor 1x per week completing step work.   History of Present Illness: Patient is referred for individual therapy after completion of CD-IOP for Opiod Use Disorder and hx of polysubstance abuse disorder, anxiety, depression. (Kratom & opiate pills)   Observations/Objective: Patient presented for today's session on time and was alert, oriented x5, with no evidence or self-report of SI/HI or A/V H.  Patient reported ongoing compliance with medication and denied any use of alcohol or illicit substances.  Clinician inquired about patient's current emotional ratings, as well as any significant changes in thoughts, feelings or behavior since previous session.  Patient reported scores of 6/10 for depression, 6/10 for anxiety, 3/10 for anger/irritability. Patient reports he has to go to work early today in 15 minutes. Cln and pt explored an update on step 4, AA meeting attendance, meeting with sponsor, work and home life. Made new appt for next week.     Follow Up Instructions: I discussed the assessment and treatment plan with the patient. The patient was provided an opportunity to ask questions and all were answered. The patient agreed with the plan and demonstrated an understanding of the instructions.   The patient was advised to call back or seek an in-person evaluation if the symptoms worsen or if the condition fails to improve as  anticipated.  I provided 15 minutes of non-face-to-face time during this encounter.   Terrie Haring S, LCAS

## 2021-06-01 DIAGNOSIS — L304 Erythema intertrigo: Secondary | ICD-10-CM | POA: Diagnosis not present

## 2021-06-05 ENCOUNTER — Other Ambulatory Visit: Payer: Self-pay

## 2021-06-05 ENCOUNTER — Encounter (HOSPITAL_COMMUNITY): Payer: Self-pay | Admitting: Licensed Clinical Social Worker

## 2021-06-05 ENCOUNTER — Ambulatory Visit (INDEPENDENT_AMBULATORY_CARE_PROVIDER_SITE_OTHER): Payer: BC Managed Care – PPO | Admitting: Licensed Clinical Social Worker

## 2021-06-05 DIAGNOSIS — F419 Anxiety disorder, unspecified: Secondary | ICD-10-CM

## 2021-06-05 DIAGNOSIS — F32A Depression, unspecified: Secondary | ICD-10-CM | POA: Diagnosis not present

## 2021-06-05 DIAGNOSIS — F1590 Other stimulant use, unspecified, uncomplicated: Secondary | ICD-10-CM | POA: Diagnosis not present

## 2021-06-05 DIAGNOSIS — F1121 Opioid dependence, in remission: Secondary | ICD-10-CM | POA: Diagnosis not present

## 2021-06-05 NOTE — Progress Notes (Signed)
  Virtual Visit via Video Note  I connected with Kevin Andrade on 06/05/2021 at  9:00 AM EDT by a video enabled telemedicine application and verified that I am speaking with the correct person using two identifiers.   I discussed the limitations of evaluation and management by telemedicine and the availability of in person appointments. The patient expressed understanding and agreed to proceed.  LOCATION: Patient: Home Provider: Home Office  Therapy Goal addressed:  Stress the importance of NA meetings AEB by 3 meeting attendance per week, working with sponsor 1x per week completing step work.   History of Present Illness: Patient is referred for individual therapy after completion of CD-IOP for Opiod Use Disorder and hx of polysubstance abuse disorder, anxiety, depression. (Kratom & opiate pills)   Observations/Objective: Patient presented for today's session on time and was alert, oriented x5, with no evidence or self-report of SI/HI or A/V H.  Patient reported ongoing compliance with medication and denied any use of alcohol or illicit substances.  Clinician inquired about patient's current emotional ratings, as well as any significant changes in thoughts, feelings or behavior since previous session.  Patient reported scores of 10/10 for depression, 6/10 for anxiety, 6/10 for anger/irritability. Patient tearfully reports his dog passed away this past week. "I lost my best friend and companion." Cln validated his feelings. Cln provided a safe platform for pt to talk about his dog, his feelings about the loss, how to move forward without his best friend. Cln provided education on the stages of grief. Pt reports he didn't go to any NA meetings over the weekend. "I did talk to my sponsor but I didn't feel like going to a meeting."  Follow Up Instructions: I discussed the assessment and treatment plan with the patient. The patient was provided an opportunity to ask questions and all were  answered. The patient agreed with the plan and demonstrated an understanding of the instructions.   The patient was advised to call back or seek an in-person evaluation if the symptoms worsen or if the condition fails to improve as anticipated.  I provided 45 minutes of non-face-to-face time during this encounter.   Caleigh Rabelo S, LCAS

## 2021-06-12 ENCOUNTER — Other Ambulatory Visit: Payer: Self-pay

## 2021-06-12 ENCOUNTER — Ambulatory Visit (HOSPITAL_COMMUNITY): Payer: BC Managed Care – PPO | Admitting: Licensed Clinical Social Worker

## 2021-06-19 ENCOUNTER — Encounter (HOSPITAL_COMMUNITY): Payer: Self-pay | Admitting: Licensed Clinical Social Worker

## 2021-06-19 ENCOUNTER — Other Ambulatory Visit: Payer: Self-pay

## 2021-06-19 ENCOUNTER — Ambulatory Visit (INDEPENDENT_AMBULATORY_CARE_PROVIDER_SITE_OTHER): Payer: BC Managed Care – PPO | Admitting: Licensed Clinical Social Worker

## 2021-06-19 DIAGNOSIS — F1121 Opioid dependence, in remission: Secondary | ICD-10-CM

## 2021-06-19 DIAGNOSIS — F32A Depression, unspecified: Secondary | ICD-10-CM

## 2021-06-19 DIAGNOSIS — F419 Anxiety disorder, unspecified: Secondary | ICD-10-CM | POA: Diagnosis not present

## 2021-06-19 DIAGNOSIS — F1590 Other stimulant use, unspecified, uncomplicated: Secondary | ICD-10-CM | POA: Diagnosis not present

## 2021-06-19 NOTE — Progress Notes (Signed)
  Virtual Visit via Video Note  I connected with Kevin Andrade on 06/19/2021 at  9:00 AM EDT by a video enabled telemedicine application and verified that I am speaking with the correct person using two identifiers.   I discussed the limitations of evaluation and management by telemedicine and the availability of in person appointments. The patient expressed understanding and agreed to proceed.  LOCATION: Patient: Home Provider: Home Office  Therapy Goal addressed:  Stress the importance of NA meetings AEB by 3 meeting attendance per week, working with sponsor 1x per week completing step work.   History of Present Illness: Patient is referred for individual therapy after completion of CD-IOP for Opiod Use Disorder and hx of polysubstance abuse disorder, anxiety, depression. (Kratom & opiate pills) Virtual Visit via Phone Note  I connected with Kevin Andrade on 05/08/2021 at  9:00 AM EDT by a phone enabled telemedicine application and verified that I am speaking with the correct person using two identifiers.   I discussed the limitations of evaluation and management by telemedicine and the availability of in person appointments. The patient expressed understanding and agreed to proceed.  LOCATION: Patient: Home Provider: Home Office  Therapy Goal addressed:  Stress the importance of NA meetings AEB by 3 meeting attendance per week, working with sponsor 1x per week completing step work.   History of Present Illness: Patient is referred for individual therapy after completion of CD-IOP for Opiod Use Disorder and hx of polysubstance abuse disorder, anxiety, depression. (Kratom & opiate pills)   Observations/Objective: Patient presented for today's session on time and was alert, oriented x5, with no evidence or self-report of SI/HI or A/V H.  Patient reported ongoing compliance with medication and denied any use of alcohol or illicit substances.  Clinician inquired about  patient's current emotional ratings, as well as any significant changes in thoughts, feelings or behavior since previous session.  Patient reported scores of 8/10 for depression, 8/10 for anxiety, 6/10 for anger/irritability. 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 Cln processed with client changes over the previous week to support sobriety and build his recovery network. Clinician praised client engagement with 12 step community and meeting attendance. Patient went to 3 in-person meetings with his sponsor over the weekend. Patient reports he met with sponsor at his usual Saturday meeting, working on IT sales professional. Pt reports added stressors: wife's health/surgery, continued family issues, grieving loss of dog. "I stayed in the bed the day after my dog passed so I wouldn't go out and use." Clinician utilized MI OARS to affirm concerns. Clinician challenged thoughts about this. Clinician processed options for communicating his concerns to his NA groups and NA sponsor.   Follow Up Instructions: I discussed the assessment and treatment plan with the patient. The patient was provided an opportunity to ask questions and all were answered. The patient agreed with the plan and demonstrated an understanding of the instructions.   The patient was advised to call back or seek an in-person evaluation if the symptoms worsen or if the condition fails to improve as anticipated.  I provided 60 minutes of non-face-to-face time during this encounter.   Atalie Oros S, LCAS

## 2021-06-26 ENCOUNTER — Other Ambulatory Visit: Payer: Self-pay

## 2021-06-26 ENCOUNTER — Ambulatory Visit (HOSPITAL_COMMUNITY): Payer: BC Managed Care – PPO | Admitting: Licensed Clinical Social Worker

## 2021-06-29 ENCOUNTER — Encounter (HOSPITAL_COMMUNITY): Payer: Self-pay | Admitting: Licensed Clinical Social Worker

## 2021-06-29 ENCOUNTER — Ambulatory Visit (INDEPENDENT_AMBULATORY_CARE_PROVIDER_SITE_OTHER): Payer: BC Managed Care – PPO | Admitting: Licensed Clinical Social Worker

## 2021-06-29 ENCOUNTER — Other Ambulatory Visit: Payer: Self-pay

## 2021-06-29 DIAGNOSIS — F32A Depression, unspecified: Secondary | ICD-10-CM | POA: Diagnosis not present

## 2021-06-29 DIAGNOSIS — F1121 Opioid dependence, in remission: Secondary | ICD-10-CM | POA: Diagnosis not present

## 2021-06-29 DIAGNOSIS — F1590 Other stimulant use, unspecified, uncomplicated: Secondary | ICD-10-CM

## 2021-06-29 DIAGNOSIS — F419 Anxiety disorder, unspecified: Secondary | ICD-10-CM

## 2021-06-29 NOTE — Progress Notes (Signed)
  Virtual Visit via Video Note  I connected with Kevin Andrade on 06/29/2021 at  9:00 AM EDT by a video enabled telemedicine application and verified that I am speaking with the correct person using two identifiers.   I discussed the limitations of evaluation and management by telemedicine and the availability of in person appointments. The patient expressed understanding and agreed to proceed.  LOCATION: Patient: Home Provider: Home Office  Therapy Goal addressed:  Stress the importance of NA meetings AEB by 3 meeting attendance per week, working with sponsor 1x per week completing step work.   History of Present Illness: Patient is referred for individual therapy after completion of CD-IOP for Opiod Use Disorder and hx of polysubstance abuse disorder, anxiety, depression. (Kratom & opiate pills) LOCATION: Patient: Home Provider: Home Office  Therapy Goal addressed:  Stress the importance of NA meetings AEB by 3 meeting attendance per week, working with sponsor 1x per week completing step work.   History of Present Illness: Patient is referred for individual therapy after completion of CD-IOP for Opiod Use Disorder and hx of polysubstance abuse disorder, anxiety, depression. (Kratom & opiate pills)   Observations/Objective: Patient presented for today's session on time and was alert, oriented x5, with no evidence or self-report of SI/HI or A/V H.  Patient reported ongoing compliance with medication and denied any use of alcohol or illicit substances.  Clinician inquired about patient's current emotional ratings, as well as any significant changes in thoughts, feelings or behavior since previous session.  Patient reported scores of 8/10 for depression, 8/10 for anxiety, 6/10 for anger/irritability. Patient reports his depressive symptoms have increased. CLn and pt explored his new stressors: grieving the loss of his dog, continued family drama, wife's health issues/surgery. "These  stressors are triggers for me to use." Cln processed with client changes over the previous week to support sobriety and build his recovery network. Clinician praised client engagement with 12 step community and meeting attendance. Patient went to 3 in-person meetings with his sponsor over the weekend. Patient reports he met with sponsor at his usual Saturday meeting, working on IT sales professional.      Follow Up Instructions: I discussed the assessment and treatment plan with the patient. The patient was provided an opportunity to ask questions and all were answered. The patient agreed with the plan and demonstrated an understanding of the instructions.   The patient was advised to call back or seek an in-person evaluation if the symptoms worsen or if the condition fails to improve as anticipated.  I provided 45 minutes of non-face-to-face time during this encounter.   Kolina Kube S, LCAS

## 2021-07-03 ENCOUNTER — Ambulatory Visit (HOSPITAL_COMMUNITY): Payer: BC Managed Care – PPO | Admitting: Licensed Clinical Social Worker

## 2021-07-03 ENCOUNTER — Other Ambulatory Visit: Payer: Self-pay

## 2021-07-06 ENCOUNTER — Encounter (HOSPITAL_COMMUNITY): Payer: Self-pay | Admitting: Licensed Clinical Social Worker

## 2021-07-06 ENCOUNTER — Ambulatory Visit (INDEPENDENT_AMBULATORY_CARE_PROVIDER_SITE_OTHER): Payer: BC Managed Care – PPO | Admitting: Licensed Clinical Social Worker

## 2021-07-06 ENCOUNTER — Other Ambulatory Visit: Payer: Self-pay

## 2021-07-06 DIAGNOSIS — F32A Depression, unspecified: Secondary | ICD-10-CM

## 2021-07-06 DIAGNOSIS — F1590 Other stimulant use, unspecified, uncomplicated: Secondary | ICD-10-CM | POA: Diagnosis not present

## 2021-07-06 DIAGNOSIS — F419 Anxiety disorder, unspecified: Secondary | ICD-10-CM | POA: Diagnosis not present

## 2021-07-06 DIAGNOSIS — F1121 Opioid dependence, in remission: Secondary | ICD-10-CM | POA: Diagnosis not present

## 2021-07-06 NOTE — Progress Notes (Signed)
  Virtual Visit via Video Note  I connected with Kevin Andrade on 07/06/2021 at  9:00 AM EST by a video enabled telemedicine application and verified that I am speaking with the correct person using two identifiers.   I discussed the limitations of evaluation and management by telemedicine and the availability of in person appointments. The patient expressed understanding and agreed to proceed.  LOCATION: Patient: Home Provider: Home Office  Therapy Goal addressed:  Stress the importance of NA meetings in order to maintain sobriety AEB by 3 meeting attendance per week, working with sponsor 1x per week completing step work.   History of Present Illness: Patient is referred for individual therapy after completion of CD-IOP for Opiod Use Disorder and hx of polysubstance abuse disorder, anxiety, depression. (Kratom & opiate pills)    Observations/Objective: Patient presented for today's session on time and was alert, oriented x5, with no evidence or self-report of SI/HI or A/V H.  Patient reported ongoing compliance with medication and denied any use of alcohol or illicit substances.  Clinician inquired about patient's current emotional ratings, as well as any significant changes in thoughts, feelings or behavior since previous session.  Patient reported scores of 8/10 for depression, 8/10 for anxiety, 7/10 for anger/irritability. Patient reports his depressive symptoms have increased due to his continued stressors: grieving the loss of his dog, continued family drama, wife's health issues/surgery. "These stressors continue to be triggers for me to use." Cln processed with client changes over the previous week to support sobriety and build his recovery network. Clinician praised client engagement with 12 step community and meeting attendance. Patient went to 3 in-person meetings with his sponsor over the weekend. Patient reports he met with sponsor at his usual Saturday meeting, working on Psychiatric nurse. Cln and pt explored moral inventory. Cln encouraged pt to continue to go to NA meetings even when his wife is recovering from her surgery.     Follow Up Instructions: I discussed the assessment and treatment plan with the patient. The patient was provided an opportunity to ask questions and all were answered. The patient agreed with the plan and demonstrated an understanding of the instructions.   The patient was advised to call back or seek an in-person evaluation if the symptoms worsen or if the condition fails to improve as anticipated.  I provided 40 minutes of non-face-to-face time during this encounter.   , S, LCAS

## 2021-07-10 ENCOUNTER — Ambulatory Visit (INDEPENDENT_AMBULATORY_CARE_PROVIDER_SITE_OTHER): Payer: BC Managed Care – PPO | Admitting: Licensed Clinical Social Worker

## 2021-07-10 ENCOUNTER — Encounter (HOSPITAL_COMMUNITY): Payer: Self-pay | Admitting: Licensed Clinical Social Worker

## 2021-07-10 ENCOUNTER — Other Ambulatory Visit: Payer: Self-pay

## 2021-07-10 DIAGNOSIS — F32A Depression, unspecified: Secondary | ICD-10-CM | POA: Diagnosis not present

## 2021-07-10 DIAGNOSIS — F1121 Opioid dependence, in remission: Secondary | ICD-10-CM

## 2021-07-10 DIAGNOSIS — F1590 Other stimulant use, unspecified, uncomplicated: Secondary | ICD-10-CM | POA: Diagnosis not present

## 2021-07-10 DIAGNOSIS — F419 Anxiety disorder, unspecified: Secondary | ICD-10-CM

## 2021-07-10 NOTE — Progress Notes (Signed)
  Virtual Visit via Video Note  I connected with Kevin Andrade on 07/10/2021 at  9:00 AM EST by a video enabled telemedicine application and verified that I am speaking with the correct person using two identifiers.   I discussed the limitations of evaluation and management by telemedicine and the availability of in person appointments. The patient expressed understanding and agreed to proceed.  LOCATION: Patient: Home Provider: Home Office  Therapy Goal addressed:  Stress the importance of NA meetings in order to maintain sobriety AEB by 3 meeting attendance per week, working with sponsor 1x per week completing step work.   History of Present Illness: Patient is referred for individual therapy after completion of CD-IOP for Opiod Use Disorder and hx of polysubstance abuse disorder, anxiety, depression. (Kratom & opiate pills)    Observations/Objective: Patient presented for today's session on time and was alert, oriented x5, with no evidence or self-report of SI/HI or A/V H.  Patient reported ongoing compliance with medication and denied any use of alcohol or illicit substances.  Clinician inquired about patient's current emotional ratings, as well as any significant changes in thoughts, feelings or behavior since previous session.  Patient reported scores of 8/10 for depression, 8/10 for anxiety, 7/10 for anger/irritability. Patient reports his depressive symptoms have increased due to his continued stressors:  continued family drama, wife's health issues/surgery. "These stressors continue to trigger me to use." Pt shared his feelings about his son and his family. Cln provided education about control, what do you have control over? "Myself."  Cln processed with client changes over the previous week to support sobriety and build his recovery network. Clinician praised client engagement with 12 step community and meeting attendance. Patient went to 3 in-person meetings with his sponsor over  the weekend. Patient reports he met with sponsor at his usual Saturday meeting, working on IT sales professional. Cln and pt explored moral inventory. Cln encouraged pt to continue to go to NA meetings even when his wife is recovering from her surgery.     Follow Up Instructions: I discussed the assessment and treatment plan with the patient. The patient was provided an opportunity to ask questions and all were answered. The patient agreed with the plan and demonstrated an understanding of the instructions.   The patient was advised to call back or seek an in-person evaluation if the symptoms worsen or if the condition fails to improve as anticipated.  I provided 60 minutes of non-face-to-face time during this encounter.   Hailyn Zarr S, LCAS

## 2021-07-13 ENCOUNTER — Telehealth (HOSPITAL_BASED_OUTPATIENT_CLINIC_OR_DEPARTMENT_OTHER): Payer: BC Managed Care – PPO | Admitting: Medical

## 2021-07-13 ENCOUNTER — Encounter (HOSPITAL_COMMUNITY): Payer: Self-pay | Admitting: Medical

## 2021-07-13 ENCOUNTER — Other Ambulatory Visit: Payer: Self-pay

## 2021-07-13 DIAGNOSIS — F1021 Alcohol dependence, in remission: Secondary | ICD-10-CM | POA: Diagnosis not present

## 2021-07-13 DIAGNOSIS — E669 Obesity, unspecified: Secondary | ICD-10-CM

## 2021-07-13 DIAGNOSIS — Z811 Family history of alcohol abuse and dependence: Secondary | ICD-10-CM

## 2021-07-13 DIAGNOSIS — F17200 Nicotine dependence, unspecified, uncomplicated: Secondary | ICD-10-CM

## 2021-07-13 DIAGNOSIS — F1421 Cocaine dependence, in remission: Secondary | ICD-10-CM

## 2021-07-13 DIAGNOSIS — F1121 Opioid dependence, in remission: Secondary | ICD-10-CM

## 2021-07-13 DIAGNOSIS — F1590 Other stimulant use, unspecified, uncomplicated: Secondary | ICD-10-CM

## 2021-07-13 DIAGNOSIS — F4312 Post-traumatic stress disorder, chronic: Secondary | ICD-10-CM

## 2021-07-13 DIAGNOSIS — T7492XS Unspecified child maltreatment, confirmed, sequela: Secondary | ICD-10-CM

## 2021-07-13 DIAGNOSIS — F419 Anxiety disorder, unspecified: Secondary | ICD-10-CM

## 2021-07-13 DIAGNOSIS — F341 Dysthymic disorder: Secondary | ICD-10-CM

## 2021-07-13 MED ORDER — NALTREXONE HCL 50 MG PO TABS: 50.0000 mg | ORAL_TABLET | Freq: Every day | ORAL | 1 refills | Status: DC

## 2021-07-13 MED ORDER — ARIPIPRAZOLE 10 MG PO TABS: 10.0000 mg | ORAL_TABLET | Freq: Every day | ORAL | 2 refills | Status: DC

## 2021-07-13 MED ORDER — TRINTELLIX 20 MG PO TABS: 20.0000 mg | ORAL_TABLET | Freq: Every day | ORAL | 0 refills | Status: DC

## 2021-07-13 MED ORDER — PREGABALIN 150 MG PO CAPS
150.0000 mg | ORAL_CAPSULE | Freq: Three times a day (TID) | ORAL | 5 refills | Status: DC
Start: 2021-07-13 — End: 2021-10-12
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
## 2021-07-17 ENCOUNTER — Ambulatory Visit (INDEPENDENT_AMBULATORY_CARE_PROVIDER_SITE_OTHER): Payer: BC Managed Care – PPO | Admitting: Licensed Clinical Social Worker

## 2021-07-17 ENCOUNTER — Encounter (HOSPITAL_COMMUNITY): Payer: Self-pay | Admitting: Licensed Clinical Social Worker

## 2021-07-17 ENCOUNTER — Other Ambulatory Visit: Payer: Self-pay

## 2021-07-17 DIAGNOSIS — F1121 Opioid dependence, in remission: Secondary | ICD-10-CM

## 2021-07-17 DIAGNOSIS — F32A Depression, unspecified: Secondary | ICD-10-CM

## 2021-07-17 DIAGNOSIS — F1590 Other stimulant use, unspecified, uncomplicated: Secondary | ICD-10-CM | POA: Diagnosis not present

## 2021-07-17 DIAGNOSIS — F419 Anxiety disorder, unspecified: Secondary | ICD-10-CM | POA: Diagnosis not present

## 2021-07-17 NOTE — Progress Notes (Signed)
  Virtual Visit via Video Note  I connected with Kevin Andrade on 07/17/2021 at  9:00 AM EST by a video enabled telemedicine application and verified that I am speaking with the correct person using two identifiers.   I discussed the limitations of evaluation and management by telemedicine and the availability of in person appointments. The patient expressed understanding and agreed to proceed.  LOCATION: Patient: Home Provider: Home Office  Therapy Goal addressed:  Stress the importance of NA meetings in order to maintain sobriety AEB by 3 meeting attendance per week, working with sponsor 1x per week completing step work.   History of Present Illness: Patient is referred for individual therapy after completion of CD-IOP for Opiod Use Disorder and hx of polysubstance abuse disorder, anxiety, depression. (Kratom & opiate pills)    Observations/Objective: Patient presented for today's session on time and was alert, oriented x5, with no evidence or self-report of SI/HI or A/V H.  Patient reported ongoing compliance with medication and denied any use of alcohol or illicit substances.  Clinician inquired about patient's current emotional ratings, as well as any significant changes in thoughts, feelings or behavior since previous session.  Patient reported scores of 6/10 for depression, 6/10 for anxiety, 5/10 for anger/irritability. Patient reports his emotional ratings have dropped over the past week because he spoke to his son about family drama. "I feel better after our talk, less stressed and a weight has been lifted." However pt continues with stressors:  wife's health issues/surgery. Clinician utilized MI OARS to reflect and summarize thoughts and feelings. Cln processed with client changes over the previous week to support sobriety and build his recovery network. Clinician praised client engagement with 12 step community and meeting attendance. Patient went to 3 in-person meetings with his  sponsor over the weekend. Patient reports he met with sponsor at his usual Saturday meeting, working on Step 4. Cln and pt explored moral inventory. Cln encouraged pt to continue to go to NA meetings even when his wife is recovering from her surgery.     Follow Up Instructions: I discussed the assessment and treatment plan with the patient. The patient was provided an opportunity to ask questions and all were answered. The patient agreed with the plan and demonstrated an understanding of the instructions.   The patient was advised to call back or seek an in-person evaluation if the symptoms worsen or if the condition fails to improve as anticipated.  I provided 60 minutes of non-face-to-face time during this encounter.   Simara Rhyner S, LCAS

## 2021-07-24 ENCOUNTER — Encounter (HOSPITAL_COMMUNITY): Payer: Self-pay | Admitting: Licensed Clinical Social Worker

## 2021-07-24 ENCOUNTER — Ambulatory Visit (INDEPENDENT_AMBULATORY_CARE_PROVIDER_SITE_OTHER): Payer: BC Managed Care – PPO | Admitting: Licensed Clinical Social Worker

## 2021-07-24 ENCOUNTER — Other Ambulatory Visit: Payer: Self-pay

## 2021-07-24 DIAGNOSIS — F419 Anxiety disorder, unspecified: Secondary | ICD-10-CM | POA: Diagnosis not present

## 2021-07-24 DIAGNOSIS — F1590 Other stimulant use, unspecified, uncomplicated: Secondary | ICD-10-CM

## 2021-07-24 DIAGNOSIS — F32A Depression, unspecified: Secondary | ICD-10-CM | POA: Diagnosis not present

## 2021-07-24 DIAGNOSIS — F1121 Opioid dependence, in remission: Secondary | ICD-10-CM | POA: Diagnosis not present

## 2021-07-24 NOTE — Progress Notes (Signed)
  Virtual Visit via Video Note  I connected with Kevin Andrade on 07/24/2021 at  9:00 AM EST by a video enabled telemedicine application and verified that I am speaking with the correct person using two identifiers.   I discussed the limitations of evaluation and management by telemedicine and the availability of in person appointments. The patient expressed understanding and agreed to proceed.  LOCATION: Patient: Home Provider: Home Office  Therapy Goal addressed:  Stress the importance of NA meetings in order to maintain sobriety AEB by 3 meeting attendance per week, working with sponsor 1x per week completing step work.   History of Present Illness: Patient is referred for individual therapy after completion of CD-IOP for Opiod Use Disorder and hx of polysubstance abuse disorder, anxiety, depression. (Kratom & opiate pills)    Observations/Objective: Patient presented for today's session on time and was alert, oriented x5, with no evidence or self-report of SI/HI or A/V H.  Patient reported ongoing compliance with medication and denied any use of alcohol or illicit substances.  Clinician inquired about patient's current emotional ratings, as well as any significant changes in thoughts, feelings or behavior since previous session.  Patient reported scores of 6/10 for depression, 6/10 for anxiety, 5/10 for anger/irritability. Patient reports his emotional ratings have increased over the past week because of family drama.  Pt discussed his stressors:  wife's health issues/upcoming surgery.  Clinician utilized CBT to process thoughts, feelings, and behaviors. Cln processed with client changes over the previous week to support sobriety and build his recovery network. Clinician praised client engagement with 12 step community and meeting attendance. Patient went to 3 in-person meetings with his sponsor over the weekend. Patient reports he met with sponsor at his usual Saturday meeting, working  on Step 4. Cln and pt explored moral inventory. Cln encouraged pt to continue to go to NA meetings even when his wife is recovering from her surgery.     Follow Up Instructions: I discussed the assessment and treatment plan with the patient. The patient was provided an opportunity to ask questions and all were answered. The patient agreed with the plan and demonstrated an understanding of the instructions.   The patient was advised to call back or seek an in-person evaluation if the symptoms worsen or if the condition fails to improve as anticipated.  I provided 60 minutes of non-face-to-face time during this encounter.   Zian Delair S, LCAS

## 2021-07-31 ENCOUNTER — Ambulatory Visit (HOSPITAL_COMMUNITY): Payer: BC Managed Care – PPO | Admitting: Licensed Clinical Social Worker

## 2021-08-07 ENCOUNTER — Ambulatory Visit (HOSPITAL_COMMUNITY): Payer: BC Managed Care – PPO | Admitting: Licensed Clinical Social Worker

## 2021-08-07 ENCOUNTER — Other Ambulatory Visit: Payer: Self-pay

## 2021-08-08 ENCOUNTER — Other Ambulatory Visit: Payer: Self-pay

## 2021-08-08 ENCOUNTER — Encounter (HOSPITAL_COMMUNITY): Payer: Self-pay | Admitting: Licensed Clinical Social Worker

## 2021-08-08 ENCOUNTER — Ambulatory Visit (INDEPENDENT_AMBULATORY_CARE_PROVIDER_SITE_OTHER): Payer: BC Managed Care – PPO | Admitting: Licensed Clinical Social Worker

## 2021-08-08 DIAGNOSIS — F1590 Other stimulant use, unspecified, uncomplicated: Secondary | ICD-10-CM | POA: Diagnosis not present

## 2021-08-08 DIAGNOSIS — F1121 Opioid dependence, in remission: Secondary | ICD-10-CM | POA: Diagnosis not present

## 2021-08-08 DIAGNOSIS — F32A Depression, unspecified: Secondary | ICD-10-CM

## 2021-08-08 DIAGNOSIS — F419 Anxiety disorder, unspecified: Secondary | ICD-10-CM

## 2021-08-08 NOTE — Progress Notes (Signed)
Virtual Visit via Video Note  I connected with Kevin Andrade on 08/08/2021 at  9:00 AM EST by a video enabled telemedicine application and verified that I am speaking with the correct person using two identifiers.   I discussed the limitations of evaluation and management by telemedicine and the availability of in person appointments. The patient expressed understanding and agreed to proceed.  LOCATION: Patient: Home Provider: Home Office  Therapy Goal addressed:  Stress the importance of NA meetings in order to maintain sobriety AEB by 3 meeting attendance per week, working with sponsor 1x per week completing step work.   History of Present Illness: Patient is referred for individual therapy after completion of CD-IOP for Opiod Use Disorder and hx of polysubstance abuse disorder, anxiety, depression. (Kratom & opiate pills)    Observations/Objective: Patient presented for todays session on time and was alert, oriented x5, with no evidence or self-report of SI/HI or A/V H.  Patient reported ongoing compliance with medication and denied any use of alcohol or illicit substances.  Clinician inquired about patients current emotional ratings, as well as any significant changes in thoughts, feelings or behavior since previous session.  Patient reported scores of 6/10 for depression, 6/10 for anxiety, 3/10 for anger/irritability. Cln and pt explore his emotional ratings and coping skill sued. Pt reports his wife had her surgery last week which increased all his emotional ratings. "I did think about using to help me through all the stress but I didn't." Pt reports he has been home taking care of his wife with little sleep. "I'm going back to work today even though I'm tired and have had little to no sleep." Clinician utilized CBT to process thoughts, feelings, and behaviors. Cln expressed concern about going back to work with little to no sleep. Cln processed with client changes over the  previous week to support sobriety and build his recovery network. Clinician praised client engagement with 12 step community and meeting attendance. Patient went to 1 in-person meetings with his sponsor over the weekend. Patient reports he met with sponsor at his usual Saturday meeting, working on Step 4. Cln and pt explored moral inventory. Again, Cln encouraged pt to continue to go to NA meetings even when his wife is recovering from her surgery.     Follow Up Instructions: I discussed the assessment and treatment plan with the patient. The patient was provided an opportunity to ask questions and all were answered. The patient agreed with the plan and demonstrated an understanding of the instructions.   The patient was advised to call back or seek an in-person evaluation if the symptoms worsen or if the condition fails to improve as anticipated.  I provided 60 minutes of non-face-to-face time during this encounter.   Toccara Alford S, LCAS

## 2021-08-10 ENCOUNTER — Encounter (HOSPITAL_COMMUNITY): Payer: Self-pay | Admitting: Medical

## 2021-08-10 ENCOUNTER — Other Ambulatory Visit: Payer: Self-pay

## 2021-08-10 ENCOUNTER — Telehealth (HOSPITAL_BASED_OUTPATIENT_CLINIC_OR_DEPARTMENT_OTHER): Payer: BC Managed Care – PPO | Admitting: Medical

## 2021-08-10 DIAGNOSIS — Z79899 Other long term (current) drug therapy: Secondary | ICD-10-CM

## 2021-08-10 DIAGNOSIS — F17200 Nicotine dependence, unspecified, uncomplicated: Secondary | ICD-10-CM

## 2021-08-10 DIAGNOSIS — F1590 Other stimulant use, unspecified, uncomplicated: Secondary | ICD-10-CM | POA: Diagnosis not present

## 2021-08-10 DIAGNOSIS — F1021 Alcohol dependence, in remission: Secondary | ICD-10-CM

## 2021-08-10 DIAGNOSIS — F32A Depression, unspecified: Secondary | ICD-10-CM | POA: Diagnosis not present

## 2021-08-10 DIAGNOSIS — F419 Anxiety disorder, unspecified: Secondary | ICD-10-CM

## 2021-08-10 DIAGNOSIS — F341 Dysthymic disorder: Secondary | ICD-10-CM

## 2021-08-10 DIAGNOSIS — F1421 Cocaine dependence, in remission: Secondary | ICD-10-CM

## 2021-08-10 DIAGNOSIS — F411 Generalized anxiety disorder: Secondary | ICD-10-CM

## 2021-08-10 DIAGNOSIS — F1121 Opioid dependence, in remission: Secondary | ICD-10-CM

## 2021-08-10 DIAGNOSIS — Z639 Problem related to primary support group, unspecified: Secondary | ICD-10-CM

## 2021-08-10 DIAGNOSIS — E669 Obesity, unspecified: Secondary | ICD-10-CM

## 2021-08-10 DIAGNOSIS — T7492XS Unspecified child maltreatment, confirmed, sequela: Secondary | ICD-10-CM

## 2021-08-10 DIAGNOSIS — Z811 Family history of alcohol abuse and dependence: Secondary | ICD-10-CM

## 2021-08-10 DIAGNOSIS — F4312 Post-traumatic stress disorder, chronic: Secondary | ICD-10-CM

## 2021-08-10 NOTE — Progress Notes (Addendum)
Patient ID: Kevin Andrade, male   DOB: 1971/02/09, 50 y.o.   MRN: [id number] Virtual Visit via Telephone Note  I connected with [name] on 08/10/21 at  4:00 PM EST by telephone and verified that I am speaking with the correct person using two identifiers.  Location: Patient: Work Provider: [facility]   I discussed the limitations, risks, security and privacy concerns of performing an evaluation and management service by telephone and the availability of in person appointments. I also discussed with the patient that there may be a patient responsible charge related to this service. The patient expressed understanding and agreed to proceed.   History of Present Illness:1 month FU Med check    Observations/Objective:Pt reports + response to addition of Abilify   Assessment and Plan:Continue current Meds/Counseling FU 2 months   Follow Up Instructions:Return 2 mos for FU -Sooner if needed    I discussed the assessment and treatment plan with the patient. The patient was provided an opportunity to ask questions and all were answered. The patient agreed with the plan and demonstrated an understanding of the instructions.   The patient was advised to call back or seek an in-person evaluation if the symptoms worsen or if the condition fails to improve as anticipated.  I provided 5 minutes of non-face-to-face time during this encounter.   [name], PA-C

## 2021-08-14 ENCOUNTER — Encounter (HOSPITAL_COMMUNITY): Payer: Self-pay | Admitting: Licensed Clinical Social Worker

## 2021-08-14 ENCOUNTER — Ambulatory Visit (INDEPENDENT_AMBULATORY_CARE_PROVIDER_SITE_OTHER): Payer: BC Managed Care – PPO | Admitting: Licensed Clinical Social Worker

## 2021-08-14 DIAGNOSIS — F32A Depression, unspecified: Secondary | ICD-10-CM | POA: Diagnosis not present

## 2021-08-14 DIAGNOSIS — F419 Anxiety disorder, unspecified: Secondary | ICD-10-CM

## 2021-08-14 DIAGNOSIS — F1590 Other stimulant use, unspecified, uncomplicated: Secondary | ICD-10-CM | POA: Diagnosis not present

## 2021-08-14 DIAGNOSIS — F1121 Opioid dependence, in remission: Secondary | ICD-10-CM

## 2021-08-14 NOTE — Progress Notes (Signed)
Virtual Visit via Video Note  I connected with Kevin Andrade on 08/14/2021 at  9:00 AM EST by a video enabled telemedicine application and verified that I am speaking with the correct person using two identifiers.   I discussed the limitations of evaluation and management by telemedicine and the availability of in person appointments. The patient expressed understanding and agreed to proceed.  LOCATION: Patient: Home Provider: Home Office  Therapy Goal addressed:  Stress the importance of NA meetings in order to maintain sobriety AEB by 3 meeting attendance per week, working with sponsor 1x per week completing step work.   History of Present Illness: Patient is referred for individual therapy after completion of CD-IOP for Opiod Use Disorder and hx of polysubstance abuse disorder, anxiety, depression. (Kratom & opiate pills)    Observations/Objective: Patient presented for todays session on time and was alert, oriented x5, with no evidence or self-report of SI/HI or A/V H.  Patient reported ongoing compliance with medication and denied any use of alcohol or illicit substances.  Clinician inquired about patients current emotional ratings, as well as any significant changes in thoughts, feelings or behavior since previous session.  Patient reported scores of 6/10 for depression, 6/10 for anxiety, 3/10 for anger/irritability. Cln and pt explore his emotional ratings and coping skill sued. Pt reports his mood is low. Cln used socratic questions. "This is the one-year anniversary of my father passing, it's the week of Christmas, I'm not seeing my son and grandchildren." Clinician utilized MI OARS to reflect and summarize thoughts and feelings. Cln processed with client changes over the previous week to support sobriety and build his recovery network. Clinician praised client engagement with 12 step community and meeting attendance. Patient went to 3 in-person meetings with his sponsor over  the weekend. Patient reports he met with sponsor at his usual Saturday meeting, working on Step 4. Cln and pt explored moral inventory. Pt reports he and sponsor began working on "letting go." Cln reviewed tx plan with pt who verbalized acceptance of the plan.     Follow Up Instructions: I discussed the assessment and treatment plan with the patient. The patient was provided an opportunity to ask questions and all were answered. The patient agreed with the plan and demonstrated an understanding of the instructions.   The patient was advised to call back or seek an in-person evaluation if the symptoms worsen or if the condition fails to improve as anticipated.  I provided 60 minutes of non-face-to-face time during this encounter.   Cosmo Tetreault S, LCAS

## 2021-08-22 ENCOUNTER — Other Ambulatory Visit: Payer: Self-pay

## 2021-08-22 ENCOUNTER — Ambulatory Visit (HOSPITAL_COMMUNITY): Payer: BC Managed Care – PPO | Admitting: Licensed Clinical Social Worker

## 2021-08-31 ENCOUNTER — Ambulatory Visit (HOSPITAL_COMMUNITY): Payer: BC Managed Care – PPO | Admitting: Licensed Clinical Social Worker

## 2021-08-31 ENCOUNTER — Other Ambulatory Visit: Payer: Self-pay

## 2021-09-04 ENCOUNTER — Ambulatory Visit (INDEPENDENT_AMBULATORY_CARE_PROVIDER_SITE_OTHER): Payer: BC Managed Care – PPO | Admitting: Licensed Clinical Social Worker

## 2021-09-04 ENCOUNTER — Other Ambulatory Visit: Payer: Self-pay

## 2021-09-04 ENCOUNTER — Encounter (HOSPITAL_COMMUNITY): Payer: Self-pay | Admitting: Licensed Clinical Social Worker

## 2021-09-04 DIAGNOSIS — F32A Depression, unspecified: Secondary | ICD-10-CM | POA: Diagnosis not present

## 2021-09-04 DIAGNOSIS — F1590 Other stimulant use, unspecified, uncomplicated: Secondary | ICD-10-CM | POA: Diagnosis not present

## 2021-09-04 DIAGNOSIS — F1121 Opioid dependence, in remission: Secondary | ICD-10-CM

## 2021-09-04 DIAGNOSIS — F419 Anxiety disorder, unspecified: Secondary | ICD-10-CM | POA: Diagnosis not present

## 2021-09-04 NOTE — Progress Notes (Signed)
Virtual Visit via Video Note  I connected with Kevin Andrade on 09/04/2021 at  9:00 AM EST by a video enabled telemedicine application and verified that I am speaking with the correct person using two identifiers.   I discussed the limitations of evaluation and management by telemedicine and the availability of in person appointments. The patient expressed understanding and agreed to proceed.  LOCATION: Patient: Home Provider: Home Office  Therapy Goal addressed:  Stress the importance of NA meetings in order to maintain sobriety AEB by 3 meeting attendance per week, working with sponsor 1x per week completing step work.   History of Present Illness: Patient is referred for individual therapy after completion of CD-IOP for Opiod Use Disorder and hx of polysubstance abuse disorder, anxiety, depression. (Kratom & opiate pills)    Observations/Objective: Patient presented for todays session on time and was alert, oriented x5, with no evidence or self-report of SI/HI or A/V H.  Patient reported ongoing compliance with medication and denied any use of alcohol or illicit substances.  Clinician inquired about patients current emotional ratings, as well as any significant changes in thoughts, feelings or behavior since previous session.  Patient reported scores of 4/10 for depression, 4/10 for anxiety, 2/10 for anger/irritability. Cln and pt explore his emotional ratings and coping skill sued. Pt reports on his holidays, which went well because he got to see his son and his family. "For some reason they are letting us see the grandchildren again." Clinician utilized MI OARS to reflect and summarize thoughts and feelings. Pt shared he was sad for a few days before Christmas due to the one-year anniversary of  his father's death. Cln and pt explored his stages of grief. Cln processed with client changes over the previous week to support sobriety and build his recovery network. Clinician praised  client engagement with 12 step community and meeting attendance. Patient went to 3 in-person meetings with his sponsor over the weekend. Patient reports he met with sponsor at his usual Saturday meeting, working now on Step 5. Cln and pt explored Step 5.     Follow Up Instructions: I discussed the assessment and treatment plan with the patient. The patient was provided an opportunity to ask questions and all were answered. The patient agreed with the plan and demonstrated an understanding of the instructions.   The patient was advised to call back or seek an in-person evaluation if the symptoms worsen or if the condition fails to improve as anticipated.  I provided 45 minutes of non-face-to-face time during this encounter.   Drayke Grabel S, LCAS

## 2021-09-11 ENCOUNTER — Ambulatory Visit (HOSPITAL_COMMUNITY): Payer: BC Managed Care – PPO | Admitting: Licensed Clinical Social Worker

## 2021-09-11 ENCOUNTER — Other Ambulatory Visit: Payer: Self-pay

## 2021-09-14 ENCOUNTER — Ambulatory Visit (INDEPENDENT_AMBULATORY_CARE_PROVIDER_SITE_OTHER): Payer: BC Managed Care – PPO | Admitting: Licensed Clinical Social Worker

## 2021-09-14 ENCOUNTER — Other Ambulatory Visit: Payer: Self-pay

## 2021-09-14 ENCOUNTER — Encounter (HOSPITAL_COMMUNITY): Payer: Self-pay | Admitting: Licensed Clinical Social Worker

## 2021-09-14 DIAGNOSIS — F1121 Opioid dependence, in remission: Secondary | ICD-10-CM

## 2021-09-14 DIAGNOSIS — F1590 Other stimulant use, unspecified, uncomplicated: Secondary | ICD-10-CM

## 2021-09-14 DIAGNOSIS — F32A Depression, unspecified: Secondary | ICD-10-CM

## 2021-09-14 DIAGNOSIS — F419 Anxiety disorder, unspecified: Secondary | ICD-10-CM

## 2021-09-14 NOTE — Progress Notes (Signed)
Virtual Visit via Phone Note  I connected with Kevin Andrade on 09/04/2021 at  9:00 AM EST by a phone-enabled telemedicine application and verified that I am speaking with the correct person using two identifiers.   I discussed the limitations of evaluation and management by telemedicine and the availability of in person appointments. The patient expressed understanding and agreed to proceed.  LOCATION: Patient: Home Provider: Home Office  Therapy Goal addressed:  Stress the importance of NA meetings in order to maintain sobriety AEB by 3 meeting attendance per week, working with sponsor 1x per week completing step work.   History of Present Illness: Patient is referred for individual therapy after completion of CD-IOP for Opiod Use Disorder and hx of polysubstance abuse disorder, anxiety, depression. (Kratom & opiate pills)    Observations/Objective: Patient presented for todays session on time and was alert, oriented x5, with no evidence or self-report of SI/HI or A/V H.  Patient reported ongoing compliance with medication and denied any use of alcohol or illicit substances.  Clinician inquired about patients current emotional ratings, as well as any significant changes in thoughts, feelings or behavior since previous session.  Patient reported scores of 4/10 for depression, 4/10 for anxiety, 2/10 for anger/irritability. Cln and pt explore his emotional ratings and coping skill sued. Pt reports on his current stressors: temporary job duties, time with grandchildren, father in law cancer dx, wife going back to work, relationship with son/daughter-in-law. Cln provided education on compartmentalization and taking one day at a time. Cln provided education on mindfulness skills. Cln processed with client changes over the previous week to support sobriety and build his recovery network. Clinician praised client engagement with 12 step community and meeting attendance. Patient went to 3  in-person meetings with his sponsor over the weekend. Patient reports he met with sponsor at his usual Saturday meeting, working now on Step 5. Cln and pt explored Step 5.     Follow Up Instructions: I discussed the assessment and treatment plan with the patient. The patient was provided an opportunity to ask questions and all were answered. The patient agreed with the plan and demonstrated an understanding of the instructions.   The patient was advised to call back or seek an in-person evaluation if the symptoms worsen or if the condition fails to improve as anticipated.  I provided 45 minutes of non-face-to-face time during this encounter.   Amandeep Hogston S, LCAS

## 2021-09-18 ENCOUNTER — Ambulatory Visit (HOSPITAL_COMMUNITY): Payer: BC Managed Care – PPO | Admitting: Licensed Clinical Social Worker

## 2021-09-18 ENCOUNTER — Other Ambulatory Visit: Payer: Self-pay

## 2021-09-19 ENCOUNTER — Ambulatory Visit (INDEPENDENT_AMBULATORY_CARE_PROVIDER_SITE_OTHER): Payer: BC Managed Care – PPO | Admitting: Licensed Clinical Social Worker

## 2021-09-19 ENCOUNTER — Encounter (HOSPITAL_COMMUNITY): Payer: Self-pay | Admitting: Licensed Clinical Social Worker

## 2021-09-19 DIAGNOSIS — F32A Depression, unspecified: Secondary | ICD-10-CM | POA: Diagnosis not present

## 2021-09-19 DIAGNOSIS — F1590 Other stimulant use, unspecified, uncomplicated: Secondary | ICD-10-CM

## 2021-09-19 DIAGNOSIS — F419 Anxiety disorder, unspecified: Secondary | ICD-10-CM | POA: Diagnosis not present

## 2021-09-19 DIAGNOSIS — F1121 Opioid dependence, in remission: Secondary | ICD-10-CM

## 2021-09-19 NOTE — Progress Notes (Signed)
Virtual Visit via Phone Note  I connected with Kevin Andrade on 09/19/2021 at  9:00 AM EST by a phone-enabled telemedicine application and verified that I am speaking with the correct person using two identifiers.   I discussed the limitations of evaluation and management by telemedicine and the availability of in person appointments. The patient expressed understanding and agreed to proceed.  LOCATION: Patient: Home Provider: Home Office  Therapy Goal addressed:  Stress the importance of NA meetings in order to maintain sobriety AEB by 3 meeting attendance per week, working with sponsor 1x per week completing step work.   History of Present Illness: Patient is referred for individual therapy after completion of CD-IOP for Opiod Use Disorder and hx of polysubstance abuse disorder, anxiety, depression. (Kratom & opiate pills)    Observations/Objective: Patient presented for todays session on time and was alert, oriented x5, with no evidence or self-report of SI/HI or A/V H.  Patient reported ongoing compliance with medication and denied any use of alcohol or illicit substances.  Clinician inquired about patients current emotional ratings, as well as any significant changes in thoughts, feelings or behavior since previous session.  Patient reported scores of 6/10 for depression, 7/10 for anxiety, 2/10 for anger/irritability. Cln and pt explore his emotional ratings and coping skills. Pt reports on his current stressors: temporary job duties, time with grandchildren, father in law cancer dx, wife going back to work, relationship with son/daughter-in-law. Pt reports his father in law is in the hospital with potential pancreatic cancer. This has caused his emotional ratings to increase. Cln and pt reviewed mindfulness skills to using during this time. Cln processed with client changes over the previous week to support sobriety and build his recovery network. Clinician praised client engagement  with 12 step community and meeting attendance. Patient went to 3 in-person meetings with his sponsor over the weekend. Patient reports he met with sponsor at his usual Saturday meeting, working now on Step 5. Cln and pt explored Step 5, role-playing "telling others of wrong committed during my use."     Follow Up Instructions: I discussed the assessment and treatment plan with the patient. The patient was provided an opportunity to ask questions and all were answered. The patient agreed with the plan and demonstrated an understanding of the instructions.   The patient was advised to call back or seek an in-person evaluation if the symptoms worsen or if the condition fails to improve as anticipated.  I provided 45 minutes of non-face-to-face time during this encounter.   Nashira Mcglynn S, LCAS

## 2021-09-25 ENCOUNTER — Ambulatory Visit (HOSPITAL_COMMUNITY): Payer: BC Managed Care – PPO | Admitting: Licensed Clinical Social Worker

## 2021-09-25 ENCOUNTER — Other Ambulatory Visit: Payer: Self-pay

## 2021-09-28 ENCOUNTER — Other Ambulatory Visit: Payer: Self-pay

## 2021-09-28 ENCOUNTER — Ambulatory Visit (HOSPITAL_COMMUNITY): Payer: BC Managed Care – PPO | Admitting: Licensed Clinical Social Worker

## 2021-10-02 ENCOUNTER — Other Ambulatory Visit: Payer: Self-pay

## 2021-10-02 ENCOUNTER — Ambulatory Visit (HOSPITAL_COMMUNITY): Payer: BC Managed Care – PPO | Admitting: Licensed Clinical Social Worker

## 2021-10-09 ENCOUNTER — Encounter (HOSPITAL_COMMUNITY): Payer: Self-pay | Admitting: Licensed Clinical Social Worker

## 2021-10-09 ENCOUNTER — Ambulatory Visit (INDEPENDENT_AMBULATORY_CARE_PROVIDER_SITE_OTHER): Payer: BC Managed Care – PPO | Admitting: Licensed Clinical Social Worker

## 2021-10-09 DIAGNOSIS — F419 Anxiety disorder, unspecified: Secondary | ICD-10-CM | POA: Diagnosis not present

## 2021-10-09 DIAGNOSIS — F1590 Other stimulant use, unspecified, uncomplicated: Secondary | ICD-10-CM | POA: Diagnosis not present

## 2021-10-09 DIAGNOSIS — F1121 Opioid dependence, in remission: Secondary | ICD-10-CM | POA: Diagnosis not present

## 2021-10-09 DIAGNOSIS — F32A Depression, unspecified: Secondary | ICD-10-CM | POA: Diagnosis not present

## 2021-10-09 NOTE — Progress Notes (Signed)
°  Virtual Visit via Video Note  I connected with Kevin Andrade on 10/09/2021 at  9:00 AM EST by a Video-enabled telemedicine application and verified that I am speaking with the correct person using two identifiers.   I discussed the limitations of evaluation and management by telemedicine and the availability of in person appointments. The patient expressed understanding and agreed to proceed.  LOCATION: Patient: Home Provider: Home Office  Therapy Goal addressed:  Stress the importance of NA meetings in order to maintain sobriety AEB by 3 meeting attendance per week, working with sponsor 1x per week completing step work.   History of Present Illness: Patient is referred for individual therapy after completion of CD-IOP for Opiod Use Disorder and hx of polysubstance abuse disorder, anxiety, depression. (Kratom & opiate pills)    Observations/Objective: Patient presented for todays session on time and was alert, oriented x5, with no evidence or self-report of SI/HI or A/V H.  Patient reported ongoing compliance with medication and denied any use of alcohol or illicit substances.  Clinician inquired about patients current emotional ratings, as well as any significant changes in thoughts, feelings or behavior since previous session.  Patient reported scores of 8/10 for depression, 7/10 for anxiety, 2/10 for anger/irritability. Cln and pt explore his emotional ratings and coping skills. Pt reports on his current stressors: father-in-law's death, wife going back to work today after surgery. "I lost my best friend." This has caused his emotional ratings to increase. Cln provided education on stages of grief. Cln processed with client changes over the previous week to support sobriety and build his recovery network. Clinician praised client engagement with 12 step community and meeting attendance. Patient went to 3 in-person meetings with his sponsor over the weekend, but missed 2 weeks right  after the death of his father-in-law. Patient reports he has continued to meet with sponsor at his usual Saturday meeting, however missed 2 weeks. "This week I'll be back to normal meeting schedule." Cln suggested pt to get back on regular meeting schedule. Cln reminded pt of his upcoming appt with C. Eloisa Northern Thursday.   Follow Up Instructions: I discussed the assessment and treatment plan with the patient. The patient was provided an opportunity to ask questions and all were answered. The patient agreed with the plan and demonstrated an understanding of the instructions.   The patient was advised to call back or seek an in-person evaluation if the symptoms worsen or if the condition fails to improve as anticipated.  I provided 45 minutes of non-face-to-face time during this encounter.   Akirah Storck S, LCAS

## 2021-10-11 ENCOUNTER — Other Ambulatory Visit (HOSPITAL_COMMUNITY): Payer: Self-pay | Admitting: Medical

## 2021-10-12 ENCOUNTER — Other Ambulatory Visit: Payer: Self-pay

## 2021-10-12 ENCOUNTER — Telehealth (HOSPITAL_BASED_OUTPATIENT_CLINIC_OR_DEPARTMENT_OTHER): Payer: BC Managed Care – PPO | Admitting: Medical

## 2021-10-12 DIAGNOSIS — T7492XS Unspecified child maltreatment, confirmed, sequela: Secondary | ICD-10-CM

## 2021-10-12 DIAGNOSIS — F1590 Other stimulant use, unspecified, uncomplicated: Secondary | ICD-10-CM | POA: Diagnosis not present

## 2021-10-12 DIAGNOSIS — F4312 Post-traumatic stress disorder, chronic: Secondary | ICD-10-CM

## 2021-10-12 DIAGNOSIS — Z811 Family history of alcohol abuse and dependence: Secondary | ICD-10-CM

## 2021-10-12 DIAGNOSIS — F17200 Nicotine dependence, unspecified, uncomplicated: Secondary | ICD-10-CM

## 2021-10-12 DIAGNOSIS — E669 Obesity, unspecified: Secondary | ICD-10-CM

## 2021-10-12 DIAGNOSIS — F1021 Alcohol dependence, in remission: Secondary | ICD-10-CM | POA: Diagnosis not present

## 2021-10-12 DIAGNOSIS — F1421 Cocaine dependence, in remission: Secondary | ICD-10-CM

## 2021-10-12 DIAGNOSIS — F1121 Opioid dependence, in remission: Secondary | ICD-10-CM

## 2021-10-12 DIAGNOSIS — F341 Dysthymic disorder: Secondary | ICD-10-CM

## 2021-10-12 DIAGNOSIS — F419 Anxiety disorder, unspecified: Secondary | ICD-10-CM

## 2021-10-12 DIAGNOSIS — Z639 Problem related to primary support group, unspecified: Secondary | ICD-10-CM

## 2021-10-12 MED ORDER — PREGABALIN 150 MG PO CAPS: 150.0000 mg | ORAL_CAPSULE | Freq: Three times a day (TID) | ORAL | 5 refills | Status: DC

## 2021-10-12 MED ORDER — NALTREXONE HCL 50 MG PO TABS: 50.0000 mg | ORAL_TABLET | Freq: Every day | ORAL | 1 refills | Status: DC

## 2021-10-12 MED ORDER — TRINTELLIX 20 MG PO TABS: ORAL_TABLET | ORAL | 0 refills | Status: DC

## 2021-10-12 MED ORDER — ARIPIPRAZOLE 10 MG PO TABS: 10.0000 mg | ORAL_TABLET | Freq: Every day | ORAL | 2 refills | Status: DC

## 2021-10-12 NOTE — Telephone Encounter (Signed)
Pt has appt today
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
## 2021-10-16 ENCOUNTER — Ambulatory Visit (INDEPENDENT_AMBULATORY_CARE_PROVIDER_SITE_OTHER): Payer: BC Managed Care – PPO | Admitting: Licensed Clinical Social Worker

## 2021-10-16 ENCOUNTER — Encounter (HOSPITAL_COMMUNITY): Payer: Self-pay | Admitting: Licensed Clinical Social Worker

## 2021-10-16 ENCOUNTER — Other Ambulatory Visit: Payer: Self-pay

## 2021-10-16 DIAGNOSIS — F419 Anxiety disorder, unspecified: Secondary | ICD-10-CM | POA: Diagnosis not present

## 2021-10-16 DIAGNOSIS — F1590 Other stimulant use, unspecified, uncomplicated: Secondary | ICD-10-CM

## 2021-10-16 DIAGNOSIS — F32A Depression, unspecified: Secondary | ICD-10-CM | POA: Diagnosis not present

## 2021-10-16 DIAGNOSIS — F1121 Opioid dependence, in remission: Secondary | ICD-10-CM | POA: Diagnosis not present

## 2021-10-16 NOTE — Progress Notes (Addendum)
°  Virtual Visit via Video Note  I connected with Kevin Andrade on 10/16/2021 at  9:00 AM EST by a Video-enabled telemedicine application and verified that I am speaking with the correct person using two identifiers.   I discussed the limitations of evaluation and management by telemedicine and the availability of in person appointments. The patient expressed understanding and agreed to proceed.  LOCATION: Patient: Home Provider: Home Office  Therapy Goal addressed:  Stress the importance of NA meetings in order to maintain sobriety AEB by 3 meeting attendance per week, working with sponsor 1x per week completing step work.   History of Present Illness: Patient is referred for individual therapy after completion of CD-IOP for Opiod Use Disorder and hx of polysubstance abuse disorder, anxiety, depression. (Kratom & opiate pills)    Observations/Objective: Patient presented for todays session on time and was alert, oriented x5, with no evidence or self-report of SI/HI or A/V H.  Patient reported ongoing compliance with medication and denied any use of alcohol or illicit substances.  Clinician inquired about patients current emotional ratings, as well as any significant changes in thoughts, feelings or behavior since previous session.  Patient reported scores of 8/10 for depression, 7/10 for anxiety, 2/10 for anger/irritability. Cln and pt explore his emotional ratings and coping skills. Pt reports on his current stressors: grief, wife's issues at work, work. My emotional ratings are still high. Again, Cln reviewed the stages of grief with pt. "I'm still in denial."  Cln processed with client changes over the previous week to support sobriety and build his recovery network. Clinician praised client engagement with 12 step community and meeting attendance. Patient went to 3 in-person meetings with his sponsor over the weekend. Patient reports he has continued to meet with sponsor at his usual  Saturday meeting, however did the session by phone, over the weekend. "I'm still working on Step 5." CLn encouraged pt to complete the step 5 worksheet that was emailed to him. Cln and pt reviewed Step 5 worksheet. Pt reports he saw PA Eloisa Northern for medication monitoring, no meds were changed. Cln encouraged pt to continue seeing PA Eloisa Northern for medication management.  Collaboration of Care: Cln suggested pt continue seeing PA [name] for medication managment  Patient/Guardian was advised Release of Information must be obtained prior to any record release in order to collaborate their care with an outside provider. Patient/Guardian was advised if they have not already done so to contact the registration department to sign all necessary forms in order for [place] to release information regarding their care.   Consent: Patient/Guardian gives verbal consent for treatment and assignment of benefits for services provided during this visit. Patient/Guardian expressed understanding and agreed to proceed.        Follow Up Instructions: I discussed the assessment and treatment plan with the patient. The patient was provided an opportunity to ask questions and all were answered. The patient agreed with the plan and demonstrated an understanding of the instructions.   The patient was advised to call back or seek an in-person evaluation if the symptoms worsen or if the condition fails to improve as anticipated.  I provided 45 minutes of non-face-to-face time during this encounter.   Mung Rinker S, LCAS

## 2021-10-17 ENCOUNTER — Other Ambulatory Visit (HOSPITAL_COMMUNITY): Payer: Self-pay | Admitting: Medical

## 2021-10-18 ENCOUNTER — Telehealth (HOSPITAL_COMMUNITY): Payer: Self-pay | Admitting: *Deleted

## 2021-10-18 NOTE — Telephone Encounter (Signed)
PA FOR TRINTELLIX 20 MG TABLETS SUBMITTED AND APPROVED THROUGH [employer].   PA CASE ID # [id number]   APPROVED FROM 10/17/21 TO 10/17/23.

## 2021-10-19 NOTE — Telephone Encounter (Signed)
Pt already has rx from appointment last week

## 2021-10-23 ENCOUNTER — Encounter (HOSPITAL_COMMUNITY): Payer: Self-pay | Admitting: Licensed Clinical Social Worker

## 2021-10-23 ENCOUNTER — Other Ambulatory Visit: Payer: Self-pay

## 2021-10-23 ENCOUNTER — Ambulatory Visit (INDEPENDENT_AMBULATORY_CARE_PROVIDER_SITE_OTHER): Payer: BC Managed Care – PPO | Admitting: Licensed Clinical Social Worker

## 2021-10-23 DIAGNOSIS — F1121 Opioid dependence, in remission: Secondary | ICD-10-CM

## 2021-10-23 DIAGNOSIS — F32A Depression, unspecified: Secondary | ICD-10-CM | POA: Diagnosis not present

## 2021-10-23 DIAGNOSIS — F419 Anxiety disorder, unspecified: Secondary | ICD-10-CM

## 2021-10-23 DIAGNOSIS — F1590 Other stimulant use, unspecified, uncomplicated: Secondary | ICD-10-CM

## 2021-10-23 NOTE — Progress Notes (Signed)
°  Virtual Visit via Video Note  I connected with Kevin Andrade on 10/23/2021 at  9:00 AM EST by a Video-enabled telemedicine application and verified that I am speaking with the correct person using two identifiers.   I discussed the limitations of evaluation and management by telemedicine and the availability of in person appointments. The patient expressed understanding and agreed to proceed.  LOCATION: Patient: Home Provider: Home Office  Therapy Goal addressed:  Stress the importance of NA meetings in order to maintain sobriety AEB by 3 meeting attendance per week, working with sponsor 1x per week completing step work.   History of Present Illness: Patient is referred for individual therapy after completion of CD-IOP for Opiod Use Disorder and hx of polysubstance abuse disorder, anxiety, depression. (Kratom & opiate pills)    Progress Toward Goal: Progressing    Observations/Objective: Patient presented for todays session on time and was alert, oriented x5, with no evidence or self-report of SI/HI or A/V H.  Patient reported ongoing compliance with medication and denied any use of alcohol or illicit substances.  Clinician inquired about patients current emotional ratings, as well as any significant changes in thoughts, feelings or behavior since previous session.  Patient reported scores of 8/10 for depression, 7/10 for anxiety, 2/10 for anger/irritability. Cln and pt explore his emotional ratings and coping skills. Pt reports on his biggest stressor: grief. My emotional ratings are still high. Again, Cln reviewed the stages of grief with pt. "I'm in the anger stage."  Cln and pt reviewed anger in the grief process. Cln processed with client changes over the previous week to support sobriety and build his recovery network. Clinician praised client engagement with 12 step community and meeting attendance. Patient went to 3 in-person meetings with his sponsor over the weekend. Patient  reports he has continued to meet with sponsor at his usual Saturday meeting. "I'm still working on Step 5. I did my step 5 at the meetings Saturday night,  Next is my family." CLn encouraged pt to complete the step 5 worksheet that was emailed to him. Cln and pt reviewed Step 5 worksheet, making notes of what to say to his family. Pt expressed concern about using Step 5 with his family. Cln and pt role played step 5.   Collaboration of Care: No collaboration was used at this session.   Patient/Guardian was advised Release of Information must be obtained prior to any record release in order to collaborate their care with an outside provider. Patient/Guardian was advised if they have not already done so to contact the registration department to sign all necessary forms in order for [place] to release information regarding their care.   Consent: Patient/Guardian gives verbal consent for treatment and assignment of benefits for services provided during this visit. Patient/Guardian expressed understanding and agreed to proceed.        Follow Up Instructions: I discussed the assessment and treatment plan with the patient. The patient was provided an opportunity to ask questions and all were answered. The patient agreed with the plan and demonstrated an understanding of the instructions.   The patient was advised to call back or seek an in-person evaluation if the symptoms worsen or if the condition fails to improve as anticipated.  I provided 45 minutes of non-face-to-face time during this encounter.   Brigitta Pricer S, LCAS

## 2021-10-25 ENCOUNTER — Encounter (HOSPITAL_COMMUNITY): Payer: Self-pay | Admitting: Medical

## 2021-10-30 ENCOUNTER — Other Ambulatory Visit: Payer: Self-pay

## 2021-10-30 ENCOUNTER — Encounter (HOSPITAL_COMMUNITY): Payer: Self-pay | Admitting: Licensed Clinical Social Worker

## 2021-10-30 ENCOUNTER — Ambulatory Visit (INDEPENDENT_AMBULATORY_CARE_PROVIDER_SITE_OTHER): Payer: BC Managed Care – PPO | Admitting: Licensed Clinical Social Worker

## 2021-10-30 DIAGNOSIS — F1590 Other stimulant use, unspecified, uncomplicated: Secondary | ICD-10-CM | POA: Diagnosis not present

## 2021-10-30 DIAGNOSIS — F32A Depression, unspecified: Secondary | ICD-10-CM | POA: Diagnosis not present

## 2021-10-30 DIAGNOSIS — F419 Anxiety disorder, unspecified: Secondary | ICD-10-CM | POA: Diagnosis not present

## 2021-10-30 DIAGNOSIS — F1121 Opioid dependence, in remission: Secondary | ICD-10-CM | POA: Diagnosis not present

## 2021-10-30 NOTE — Progress Notes (Signed)
?  Virtual Visit via Video Note ? ?I connected with Kevin Andrade on 10/30/2021 at  9:00 AM EST by a Video-enabled telemedicine application and verified that I am speaking with the correct person using two identifiers. ?  ?I discussed the limitations of evaluation and management by telemedicine and the availability of in person appointments. The patient expressed understanding and agreed to proceed. ? ?LOCATION: ?Patient: Home ?Provider: Home Office ? ?Therapy Goal addressed:  ?Stress the importance of NA meetings in order to maintain sobriety AEB by 3 meeting attendance per week, working with sponsor 1x per week completing step work. ? ? ?History of Present Illness: Patient is referred for individual therapy after completion of CD-IOP for Opiod Use Disorder and hx of polysubstance abuse disorder, anxiety, depression. ?(Kratom & opiate pills)  ? ? ?Progress Toward Goal: Progressing ? ?  ?Observations/Objective: Patient presented for today?s session on time and was alert, oriented x5, with no evidence or self-report of SI/HI or A/V H.  Patient reported ongoing compliance with medication and denied any use of alcohol or illicit substances.  Clinician inquired about patient?s current emotional ratings, as well as any significant changes in thoughts, feelings or behavior since previous session.  Patient reported scores of 8/10 for depression, 7/10 for anxiety, 2/10 for anger/irritability. Cln and pt explore his emotional ratings and coping skills. Pt reports on his biggest stressor: grief. My emotional ratings are still high. Again, Cln reviewed the stages of grief with pt. "I'm still in the anger stage."  Again, Cln and pt reviewed anger in the grief process. Cln processed with client changes over the previous week to support sobriety and build his recovery network. Clinician praised client engagement with 12 step community and meeting attendance. Patient went to 3 in-person meetings with his sponsor over the  weekend. Patient reports he has continued to meet with sponsor at his usual Saturday meeting. "I've moved on to  Step 6." Cln and pt reviewed step 6. Pt had to be at work early today so session ended after 30 minutes. ? ? ?Collaboration of Care: No collaboration was used at this session.  ? ?Patient/Guardian was advised Release of Information must be obtained prior to any record release in order to collaborate their care with an outside provider. Patient/Guardian was advised if they have not already done so to contact the registration department to sign all necessary forms in order for [place] to release information regarding their care.  ? ?Consent: Patient/Guardian gives verbal consent for treatment and assignment of benefits for services provided during this visit. Patient/Guardian expressed understanding and agreed to proceed.   ? ? ? ?  ?Follow Up Instructions: I discussed the assessment and treatment plan with the patient. The patient was provided an opportunity to ask questions and all were answered. The patient agreed with the plan and demonstrated an understanding of the instructions. ?  ?The patient was advised to call back or seek an in-person evaluation if the symptoms worsen or if the condition fails to improve as anticipated. ? ?I provided 30 minutes of non-face-to-face time during this encounter. ? ? ?Quinton Voth S, LCAS ? ? ?  ? ? ?

## 2021-11-06 ENCOUNTER — Other Ambulatory Visit: Payer: Self-pay

## 2021-11-06 ENCOUNTER — Ambulatory Visit (INDEPENDENT_AMBULATORY_CARE_PROVIDER_SITE_OTHER): Payer: BC Managed Care – PPO | Admitting: Licensed Clinical Social Worker

## 2021-11-06 DIAGNOSIS — F419 Anxiety disorder, unspecified: Secondary | ICD-10-CM | POA: Diagnosis not present

## 2021-11-06 DIAGNOSIS — F32A Depression, unspecified: Secondary | ICD-10-CM | POA: Diagnosis not present

## 2021-11-06 DIAGNOSIS — F1121 Opioid dependence, in remission: Secondary | ICD-10-CM

## 2021-11-06 DIAGNOSIS — F1590 Other stimulant use, unspecified, uncomplicated: Secondary | ICD-10-CM | POA: Diagnosis not present

## 2021-11-08 ENCOUNTER — Encounter (HOSPITAL_COMMUNITY): Payer: Self-pay | Admitting: Licensed Clinical Social Worker

## 2021-11-08 NOTE — Progress Notes (Signed)
?  Virtual Visit via Video Note ? ?I connected with Kevin Andrade on 11/06/2021 at  9:00 AM EDT by a Video-enabled telemedicine application and verified that I am speaking with the correct person using two identifiers. ?  ?I discussed the limitations of evaluation and management by telemedicine and the availability of in person appointments. The patient expressed understanding and agreed to proceed. ? ?LOCATION: ?Patient: Home ?Provider: Home Office ? ?Therapy Goal addressed:  ?Stress the importance of NA meetings in order to maintain sobriety AEB by 3 meeting attendance per week, working with sponsor 1x per week completing step work. ? ? ?History of Present Illness: Patient is referred for individual therapy after completion of CD-IOP for Opiod Use Disorder and hx of polysubstance abuse disorder, anxiety, depression. ?(Kratom & opiate pills)  ? ? ?Progress Toward Goal: Progressing ? ?  ?Observations/Objective: Patient presented for today?s session on time and was alert, oriented x5, with no evidence or self-report of SI/HI or A/V H.  Patient reported ongoing compliance with medication and denied any use of alcohol or illicit substances.  Clinician inquired about patient?s current emotional ratings, as well as any significant changes in thoughts, feelings or behavior since previous session.  Patient reported scores of 8/10 for depression, 7/10 for anxiety, 2/10 for anger/irritability. Cln and pt explore his emotional ratings and coping skills. Pt reports on his biggest stressor currently: grief. My emotional ratings are still high. Pt reports "I'm still in the anger stage."  Cln explained the grief process, its not linear. Cln processed with client changes over the previous week to support sobriety and build his recovery network. Clinician praised client engagement with 12 step community and meeting attendance. Patient went to 3 in-person meetings with his sponsor over the weekend. Patient reports he has  continued to meet with sponsor at his usual Saturday meeting. "I've moved on to  Step 6." Cln and pt reviewed step 6. "Have higher power remove all our defects." ? ? ?Collaboration of Care: No collaboration needed at this session.  ? ?Patient/Guardian was advised Release of Information must be obtained prior to any record release in order to collaborate their care with an outside provider. Patient/Guardian was advised if they have not already done so to contact the registration department to sign all necessary forms in order for [place] to release information regarding their care.  ? ?Consent: Patient/Guardian gives verbal consent for treatment and assignment of benefits for services provided during this visit. Patient/Guardian expressed understanding and agreed to proceed.   ? ? ? ?  ?Follow Up Instructions: I discussed the assessment and treatment plan with the patient. The patient was provided an opportunity to ask questions and all were answered. The patient agreed with the plan and demonstrated an understanding of the instructions. ?  ?The patient was advised to call back or seek an in-person evaluation if the symptoms worsen or if the condition fails to improve as anticipated. ? ?I provided 45 minutes of non-face-to-face time during this encounter. ? ? ?Jaecob Lowden S, LCAS ? ? ?  ? ? ?

## 2021-11-13 ENCOUNTER — Other Ambulatory Visit: Payer: Self-pay

## 2021-11-13 ENCOUNTER — Ambulatory Visit (HOSPITAL_COMMUNITY): Payer: BC Managed Care – PPO | Admitting: Licensed Clinical Social Worker

## 2021-11-15 ENCOUNTER — Ambulatory Visit (INDEPENDENT_AMBULATORY_CARE_PROVIDER_SITE_OTHER): Payer: BC Managed Care – PPO | Admitting: Licensed Clinical Social Worker

## 2021-11-15 ENCOUNTER — Encounter (HOSPITAL_COMMUNITY): Payer: Self-pay | Admitting: Licensed Clinical Social Worker

## 2021-11-15 ENCOUNTER — Other Ambulatory Visit: Payer: Self-pay

## 2021-11-15 DIAGNOSIS — F1121 Opioid dependence, in remission: Secondary | ICD-10-CM

## 2021-11-15 DIAGNOSIS — F1590 Other stimulant use, unspecified, uncomplicated: Secondary | ICD-10-CM | POA: Diagnosis not present

## 2021-11-15 DIAGNOSIS — F32A Depression, unspecified: Secondary | ICD-10-CM

## 2021-11-15 DIAGNOSIS — F419 Anxiety disorder, unspecified: Secondary | ICD-10-CM | POA: Diagnosis not present

## 2021-11-15 NOTE — Progress Notes (Signed)
?  Virtual Visit via Video Note ? ?I connected with Kevin Andrade on 11/15/2021 at 10:00 AM EDT by a Video-enabled telemedicine application and verified that I am speaking with the correct person using two identifiers. ?  ?I discussed the limitations of evaluation and management by telemedicine and the availability of in person appointments. The patient expressed understanding and agreed to proceed. ? ?LOCATION: ?Patient: Home ?Provider: Home Office ? ?Therapy Goal addressed:  ?Pt will learn  the importance of NA meetings in order to maintain sobriety AEB by 3 meeting attendance per week, working with sponsor 1x per week and completing step work. ? ? ?History of Present Illness: Patient is referred for individual therapy after completion of CD-IOP for Opiod Use Disorder and hx of polysubstance abuse disorder, anxiety, depression. ?(Kratom & opiate pills)  ? ? ?Progress Toward Goal: Progressing ? ?  ?Observations/Objective: Patient presented for today?s session on time and was alert, oriented x5, with no evidence or self-report of SI/HI or A/V H.  Patient reported ongoing compliance with medication and denied any use of alcohol or illicit substances.  Clinician inquired about patient?s current emotional ratings, as well as any significant changes in thoughts, feelings or behavior since previous session.  Patient reported scores of 8/10 for depression, 7/10 for anxiety, 2/10 for anger/irritability. Cln and pt explore his emotional ratings and coping skills. Pt reports on his biggest stressor continues to be grief. My emotional ratings are still high. Pt reports "I think I have moved to the depression stage."  Cln explained the cycle of depression in grief with patient.  Cln processed with client changes over the previous week to support sobriety and build his recovery network. Clinician praised client engagement with 12 step community and meeting attendance. Patient went to 3 in-person meetings with his sponsor  over the weekend. Patient reports he has continued to meet with sponsor over the phone this past Saturday. "I've moved on to  Step 6." Cln and pt reviewed step 6. "Have higher power remove all our defects." Pt shared he is struggling with this step. "I'm struggling with letting go and shame." Cln sent pt 2 worksheets on these topics for pt to review and discuss next session. Pt reports "I feel tired all the time." Cln suggested pt set up appt with PCP to discuss sleep study.  ? ?Plan: using thoughts? ? ? ?Collaboration of Care: Cln suggested pt talk to his PCP about a sleep study. ? ?Patient/Guardian was advised Release of Information must be obtained prior to any record release in order to collaborate their care with an outside provider. Patient/Guardian was advised if they have not already done so to contact the registration department to sign all necessary forms in order for [place] to release information regarding their care.  ? ?Consent: Patient/Guardian gives verbal consent for treatment and assignment of benefits for services provided during this visit. Patient/Guardian expressed understanding and agreed to proceed.   ? ? ? ?  ?Follow Up Instructions: I discussed the assessment and treatment plan with the patient. The patient was provided an opportunity to ask questions and all were answered. The patient agreed with the plan and demonstrated an understanding of the instructions. ?  ?The patient was advised to call back or seek an in-person evaluation if the symptoms worsen or if the condition fails to improve as anticipated. ? ?I provided 45 minutes of non-face-to-face time during this encounter. ? ? ?Lillien Petronio S, LCAS ? ? ?  ? ? ?

## 2021-11-20 ENCOUNTER — Other Ambulatory Visit: Payer: Self-pay

## 2021-11-20 ENCOUNTER — Ambulatory Visit (INDEPENDENT_AMBULATORY_CARE_PROVIDER_SITE_OTHER): Payer: BC Managed Care – PPO | Admitting: Licensed Clinical Social Worker

## 2021-11-20 ENCOUNTER — Encounter (HOSPITAL_COMMUNITY): Payer: Self-pay | Admitting: Licensed Clinical Social Worker

## 2021-11-20 DIAGNOSIS — F1590 Other stimulant use, unspecified, uncomplicated: Secondary | ICD-10-CM

## 2021-11-20 DIAGNOSIS — F419 Anxiety disorder, unspecified: Secondary | ICD-10-CM | POA: Diagnosis not present

## 2021-11-20 DIAGNOSIS — F1121 Opioid dependence, in remission: Secondary | ICD-10-CM

## 2021-11-20 DIAGNOSIS — F32A Depression, unspecified: Secondary | ICD-10-CM

## 2021-11-20 NOTE — Progress Notes (Signed)
?  Virtual Visit via Video Note ? ?I connected with Kevin Andrade on 11/20/2021 at  9:00 AM EDT by a Video-enabled telemedicine application and verified that I am speaking with the correct person using two identifiers. ?  ?I discussed the limitations of evaluation and management by telemedicine and the availability of in person appointments. The patient expressed understanding and agreed to proceed. ? ?LOCATION: ?Patient: Home ?Provider: [facility] ? ?Therapy Goal addressed:  ?Pt will learn  the importance of NA meetings in order to maintain sobriety AEB by 3 meeting attendance per week, working with sponsor 1x per week and completing step work. ? ? ?History of Present Illness: Patient is referred for individual therapy after completion of CD-IOP for Opiod Use Disorder and hx of polysubstance abuse disorder, anxiety, depression. ?(Kratom & opiate pills)  ? ? ?Progress Toward Goal: Progressing ? ?  ?Observations/Objective: Patient presented for today?s session on time and was alert, oriented x5, with no evidence or self-report of SI/HI or A/V H.  Patient reported ongoing compliance with medication and denied any use of alcohol or illicit substances.  Clinician inquired about patient?s current emotional ratings, as well as any significant changes in thoughts, feelings or behavior since previous session.  Patient reported scores of 8/10 for depression, 7/10 for anxiety, 2/10 for anger/irritability. Cln and pt explore his emotional ratings and coping skills. Pt reports his biggest stressor continues to be grief. My emotional ratings are still high. Pt reports "I think I have moved to the depression stage."  Again, Cln explained the cycle of depression in grief with patient.  Cln processed with client changes over the previous week to support sobriety and build his recovery network. Clinician praised client engagement with 12 step community and meeting attendance. Patient went to 4 in-person meetings with his  sponsor over the weekend. "I went to a new meeting." Cln and pt processed "the new meeting." "I'm still working on to  Step 6." "I'm struggling with letting go and guild & shame in recovery." Cln emailed 2 worksheets  and reviewed the worksheets during this session.  Cln and pt will continue to review these topics at next session. ? ?Plan: using thoughts? Sleep study ? ? ?Collaboration of Care: Cln suggested pt talk to his PCP about a sleep study. ? ?Patient/Guardian was advised Release of Information must be obtained prior to any record release in order to collaborate their care with an outside provider. Patient/Guardian was advised if they have not already done so to contact the registration department to sign all necessary forms in order for [place] to release information regarding their care.  ? ?Consent: Patient/Guardian gives verbal consent for treatment and assignment of benefits for services provided during this visit. Patient/Guardian expressed understanding and agreed to proceed.   ? ? ? ?  ?Follow Up Instructions: I discussed the assessment and treatment plan with the patient. The patient was provided an opportunity to ask questions and all were answered. The patient agreed with the plan and demonstrated an understanding of the instructions. ?  ?The patient was advised to call back or seek an in-person evaluation if the symptoms worsen or if the condition fails to improve as anticipated. ? ?I provided 45 minutes of non-face-to-face time during this encounter. ? ? ?Hillery Zachman S, LCAS ? ? ?  ? ? ?

## 2021-11-27 ENCOUNTER — Ambulatory Visit (INDEPENDENT_AMBULATORY_CARE_PROVIDER_SITE_OTHER): Payer: BC Managed Care – PPO | Admitting: Licensed Clinical Social Worker

## 2021-11-27 ENCOUNTER — Encounter (HOSPITAL_COMMUNITY): Payer: Self-pay | Admitting: Licensed Clinical Social Worker

## 2021-11-27 DIAGNOSIS — F32A Depression, unspecified: Secondary | ICD-10-CM | POA: Diagnosis not present

## 2021-11-27 DIAGNOSIS — F419 Anxiety disorder, unspecified: Secondary | ICD-10-CM | POA: Diagnosis not present

## 2021-11-27 DIAGNOSIS — F1121 Opioid dependence, in remission: Secondary | ICD-10-CM | POA: Diagnosis not present

## 2021-11-27 DIAGNOSIS — F1590 Other stimulant use, unspecified, uncomplicated: Secondary | ICD-10-CM | POA: Diagnosis not present

## 2021-11-27 NOTE — Progress Notes (Signed)
?  Virtual Visit via Video Note ? ?I connected with Kevin Andrade on 11/20/2021 at  9:00 AM EDT by a Video-enabled telemedicine application and verified that I am speaking with the correct person using two identifiers. ?  ?I discussed the limitations of evaluation and management by telemedicine and the availability of in person appointments. The patient expressed understanding and agreed to proceed. ? ?LOCATION: ?Patient: Home ?Provider: Home [facility] ? ?Therapy Goal addressed:  ?Pt will learn  the importance of NA meetings in order to maintain sobriety AEB by 3 meeting attendance per week, working with sponsor 1x per week and completing step work. ? ? ?History of Present Illness: Patient is referred for individual therapy after completion of CD-IOP for Opiod Use Disorder and hx of polysubstance abuse disorder, anxiety, depression. ?(Kratom & opiate pills)  ? ? ?Progress Toward Goal: Progressing ? ?  ?Observations/Objective: Patient presented for today?s session on time and was alert, oriented x5, with no evidence or self-report of SI/HI or A/V H.  Patient reported ongoing compliance with medication and denied any use of alcohol or illicit substances.  Clinician inquired about patient?s current emotional ratings, as well as any significant changes in thoughts, feelings or behavior since previous session.  Patient reported scores of 8/10 for depression, 7/10 for anxiety, 2/10 for anger/irritability. Cln and pt explore his emotional ratings and coping skills. Pt reports his biggest stressor continues to be grief. My emotional ratings are still high. Pt reports "I continue to be in the depressive stage of grief."   Cln processed with client changes over the previous week to support sobriety and build his recovery network. Clinician praised client engagement with 12 step community and meeting attendance. Patient went to 3 in-person meetings with his sponsor over the weekend. "I'm still working on to  Step 6." "I  continue to struggle with letting go and guilt & shame in recovery." Cln and pt reviewed guilt and shame and letting go. Pt discussed how he felt different as a child, due to his family's religion, will continue this discussion at next session. ? ? ? ?Plan: using thoughts? Sleep study ? ? ?Collaboration of Care: Cln suggested pt talk to his PCP about a sleep study. ? ?Patient/Guardian was advised Release of Information must be obtained prior to any record release in order to collaborate their care with an outside provider. Patient/Guardian was advised if they have not already done so to contact the registration department to sign all necessary forms in order for [place] to release information regarding their care.  ? ?Consent: Patient/Guardian gives verbal consent for treatment and assignment of benefits for services provided during this visit. Patient/Guardian expressed understanding and agreed to proceed.   ? ? ? ?  ?Follow Up Instructions: I discussed the assessment and treatment plan with the patient. The patient was provided an opportunity to ask questions and all were answered. The patient agreed with the plan and demonstrated an understanding of the instructions. ?  ?The patient was advised to call back or seek an in-person evaluation if the symptoms worsen or if the condition fails to improve as anticipated. ? ?I provided 60 minutes of non-face-to-face time during this encounter. ? ? ?Reily Ilic S, LCAS ? ? ?  ? ? ?

## 2021-12-04 ENCOUNTER — Ambulatory Visit (HOSPITAL_COMMUNITY): Payer: BC Managed Care – PPO | Admitting: Licensed Clinical Social Worker

## 2021-12-11 ENCOUNTER — Ambulatory Visit (INDEPENDENT_AMBULATORY_CARE_PROVIDER_SITE_OTHER): Payer: BC Managed Care – PPO | Admitting: Licensed Clinical Social Worker

## 2021-12-11 ENCOUNTER — Encounter (HOSPITAL_COMMUNITY): Payer: Self-pay | Admitting: Licensed Clinical Social Worker

## 2021-12-11 DIAGNOSIS — F1121 Opioid dependence, in remission: Secondary | ICD-10-CM | POA: Diagnosis not present

## 2021-12-11 DIAGNOSIS — F32A Depression, unspecified: Secondary | ICD-10-CM | POA: Diagnosis not present

## 2021-12-11 DIAGNOSIS — F419 Anxiety disorder, unspecified: Secondary | ICD-10-CM | POA: Diagnosis not present

## 2021-12-11 DIAGNOSIS — F1590 Other stimulant use, unspecified, uncomplicated: Secondary | ICD-10-CM | POA: Diagnosis not present

## 2021-12-11 NOTE — Progress Notes (Signed)
?  Virtual Visit via Video Note ? ?I connected with Kevin Andrade on 12/11/2021 at  9:00 AM EDT by a Video-enabled telemedicine application and verified that I am speaking with the correct person using two identifiers. ?  ?I discussed the limitations of evaluation and management by telemedicine and the availability of in person appointments. The patient expressed understanding and agreed to proceed. ? ?LOCATION: ?Patient: Home ?Provider: Home Office ? ?Therapy Goal addressed:  ?Pt will learn  the importance of NA meetings in order to maintain sobriety AEB by 3 meeting attendance per week, working with sponsor 1x per week and completing step work. ? ? ?History of Present Illness: Patient is referred for individual therapy after completion of CD-IOP for Opiod Use Disorder and hx of polysubstance abuse disorder, anxiety, depression. ?(Kratom & opiate pills)  ? ? ?Progress Toward Goal: Progressing ? ?  ?Observations/Objective: Patient presented for today?s session on time and was alert, oriented x5, with no evidence or self-report of SI/HI or A/V H.  Patient reported ongoing compliance with medication and denied any use of alcohol or illicit substances.  Clinician inquired about patient?s current emotional ratings, as well as any significant changes in thoughts, feelings or behavior since previous session.  Patient reported scores of 6/10 for depression, 6/10 for anxiety, 2/10 for anger/irritability. Cln and pt explored his emotional ratings and coping skills. "My brother had a heart attack. It's got me thinking about my own health." Cln and pt reviewed his health (smoking, unhealthy eating habits, stress). Clinician utilized CBT to process thoughts, feelings, and behaviors. Clinician provided supportive feedback. Cln processed with client changes over the previous week to support sobriety and build his recovery network. Clinician praised client engagement with 12 step community and meeting attendance. Patient went  to 1 in-person meeting with his sponsor over the weekend. "I'm still working on to  Step 6." "I continue to struggle with letting go and guilt & shame in recovery." Cln encouraged pt to continue reviewing his information on guilt and shame in recovery and letting go. Pt reports he has an appt with PCP on Wednesday. Cln and pt reviewed topcis to bring up: sleep study, blood pressure, A1c, smoking cessation. Cln provided education on smoking cessation. ? ? ? ? ?Collaboration of Care: Cln suggested pt talk to his PCP about a sleep study. ? ?Patient/Guardian was advised Release of Information must be obtained prior to any record release in order to collaborate their care with an outside provider. Patient/Guardian was advised if they have not already done so to contact the registration department to sign all necessary forms in order for [place] to release information regarding their care.  ? ?Consent: Patient/Guardian gives verbal consent for treatment and assignment of benefits for services provided during this visit. Patient/Guardian expressed understanding and agreed to proceed.   ? ? ? ?  ?Follow Up Instructions: I discussed the assessment and treatment plan with the patient. The patient was provided an opportunity to ask questions and all were answered. The patient agreed with the plan and demonstrated an understanding of the instructions. ?  ?The patient was advised to call back or seek an in-person evaluation if the symptoms worsen or if the condition fails to improve as anticipated. ? ?I provided 45 minutes of non-face-to-face time during this encounter. ? ? ?Dakari Cregger S, LCAS ? ? ?  ? ? ?

## 2021-12-12 ENCOUNTER — Other Ambulatory Visit (HOSPITAL_COMMUNITY): Payer: Self-pay | Admitting: Medical

## 2021-12-13 ENCOUNTER — Encounter: Payer: Self-pay | Admitting: Family Medicine

## 2021-12-13 ENCOUNTER — Ambulatory Visit (INDEPENDENT_AMBULATORY_CARE_PROVIDER_SITE_OTHER): Payer: BC Managed Care – PPO | Admitting: Family Medicine

## 2021-12-13 VITALS — BP 136/74 | HR 67 | Temp 98.2°F | Wt 276.8 lb

## 2021-12-13 DIAGNOSIS — Z6836 Body mass index (BMI) 36.0-36.9, adult: Secondary | ICD-10-CM

## 2021-12-13 DIAGNOSIS — R400 Somnolence: Secondary | ICD-10-CM

## 2021-12-13 DIAGNOSIS — F17210 Nicotine dependence, cigarettes, uncomplicated: Secondary | ICD-10-CM | POA: Diagnosis not present

## 2021-12-13 DIAGNOSIS — R7303 Prediabetes: Secondary | ICD-10-CM

## 2021-12-13 LAB — POCT GLYCOSYLATED HEMOGLOBIN (HGB A1C): Hemoglobin A1C: 5.7 % — AB (ref 4.0–5.6)

## 2021-12-13 MED ORDER — VARENICLINE TARTRATE 0.5 MG PO TABS: ORAL_TABLET | ORAL | 0 refills | Status: DC

## 2021-12-13 NOTE — Patient Instructions (Addendum)
Your A1C was 5.7% this visit. ? ? ?How to Kick the Smoking Habit ? ? Why should I quit smoking? ? ?Quitting smoking is the most important thing you can do for your health. Smoking can cause cancer, lung disease, heart disease, and many other health problems. Secondhand smoke can be dangerous too. It can cause lung cancer and heart disease in adults. It can make asthma worse or cause ear infections in kids.  ? ?You'll see benefits as soon as you quit smoking. Your heart rate and blood pressure will go down. You'll breathe easier. It will be easier to exercise. Your sense of smell and taste will be better. You'll lower your risk of cancer, lung disease, and heart disease. You'll even live longer!  ? ?Why is it so hard to quit smoking? ? ?Nicotine is a strong drug. Your body becomes addicted to nicotine when you smoke. You may have withdrawal symptoms or cravings when you stop smoking. You may become anxious or irritable. You might have trouble sleeping or want to eat more. These symptoms are usually worst the first week after quitting. The good news is nicotine withdrawal symptoms only last a few weeks for most people. ? ?The routines and habits that go along with smoking can make it tough to quit too. Some people often smoke a cigarette when they drive, after a meal, or when they're on the phone. Smoking can become a part of these routines. After you quit smoking these habits can be a trigger to make you want to smoke again. It's important to separate smoking from these routines when you quit. ? ?How can I make it easier to quit? ? ?You don't have to quit "cold [place]." You can double or triple the chance that you'll stop smoking if you use a medicine and counseling together. There are many medicines available. These medicines work in different ways to help manage nicotine withdrawal. Many can be bought off the shelves at your local pharmacy. Some require a prescription. Talk to your pharmacist or prescriber about  what medicines may be right for you. ? ?It is very important to have counseling when you quit. Medicines can help you cope with nicotine withdrawal. Counseling can help you develop skills to break smoking habits. There are lots of counseling options available. Many of these are free. Some options are local support groups, telephone quitlines, online services, and texting programs.  ? ?Start thinking now about how you plan to quit. Think about why you want to quit. Look at triggers that make you want to smoke. Plan for challenges you might face when trying to quit. Talk to your pharmacist about how to get help.  ? ?Where can I learn more? ? ?Toll-free Quitlines and Websites:  ?In the [place] 1-800-QUIT-NOW(1-7033649648); http://smokefree.gov  ? ? ?These websites include online support, live chat, and text messaging programs.  ? ? ? ?

## 2021-12-13 NOTE — Progress Notes (Signed)
Subjective:  ? ? Patient ID: Kevin Andrade, male    DOB: 1970/12/28, 51 y.o.   MRN: [id number] ? ?Chief Complaint  ?Patient presents with  ? sleep study  ?  Wants to discuss home sleep study. Issues sleeping, waking up tired, O2 drops down to 82-83 from smart watch. ?Get a1c checked.   ? ? ?HPI ?Patient was seen today for ongoing concerns.  Patient endorses daytime somnolence, snoring at night, waking up feeling un-rested.  Could fall asleep at stop sign/stoplights.  Patient interested in quitting smoking.  Currently smoking 1 pack/day.  At the most pt was smoking 2 packs/day.  Smoking for 33 years.  Tried quitting cold [place] 1 month ago which was not successful.  In the past patient tried Wellbutrin, patches, gum which did not work.  Patient also tried [name] which took urges away but caused vivid nightmares.  Patient interested in restarting [name] despite the dreams.  Patient requesting recheck of A1c.  Last A1c 6.0% on 04/20/2021.  Endorses walking for exercise.  States eats dinner around 9 PM as works from 12 to 9 PM.  Patient typically does not eat breakfast.  Will eat fast food for lunch. ? ?Past Medical History:  ?Diagnosis Date  ? Anxiety   ? Hypersomnia, persistent   ? Insomnia with sleep apnea   ? Sleep apnea   ? Substance abuse ([place])   ? Tobacco use disorder   ? ? ?No Known Allergies ? ?ROS ?General: Denies fever, chills, night sweats, changes in weight, changes in appetite  +snoring, daytime somnolence ?HEENT: Denies headaches, ear pain, changes in vision, rhinorrhea, sore throat ?CV: Denies CP, palpitations, SOB, orthopnea ?Pulm: Denies SOB, cough, wheezing ?GI: Denies abdominal pain, nausea, vomiting, diarrhea, constipation ?GU: Denies dysuria, hematuria, frequency, vaginal discharge ?Msk: Denies muscle cramps, joint pains ?Neuro: Denies weakness, numbness, tingling ?Skin: Denies rashes, bruising ?Psych: Denies depression, anxiety, hallucinations ? ?   ?Objective:  ?  ?Blood pressure 136/74,  pulse 67, temperature 98.2 ?F (36.8 ?C), temperature source Oral, weight 276 lb 12.8 oz (125.6 kg), SpO2 97 %. ? ?Gen. Pleasant, well-nourished, in no distress, normal affect   ?HEENT: [place]/AT, face symmetric, conjunctiva clear, no scleral icterus, PERRLA, EOMI, nares patent without drainage, ?Neck: No JVD, no thyromegaly, no carotid bruits ?Lungs: no accessory muscle use, CTAB, no wheezes or rales ?Cardiovascular: RRR, no m/r/g, no peripheral edema ?Musculoskeletal: No deformities, no cyanosis or clubbing, normal tone ?Neuro:  A&Ox3, CN II-XII intact, normal gait ?Skin:  Warm, no lesions/ rash ? ? ?Wt Readings from Last 3 Encounters:  ?12/13/21 276 lb 12.8 oz (125.6 kg)  ?04/20/21 273 lb 6.4 oz (124 kg)  ?12/18/18 266 lb 12.1 oz (121 kg)  ? ? ?Lab Results  ?Component Value Date  ? WBC 7.6 04/20/2021  ? HGB 14.8 04/20/2021  ? HCT 43.4 04/20/2021  ? PLT 239.0 04/20/2021  ? GLUCOSE 55 (L) 04/20/2021  ? CHOL 181 04/20/2021  ? TRIG 119.0 04/20/2021  ? HDL 52.40 04/20/2021  ? LDLCALC 105 (H) 04/20/2021  ? ALT 24 04/20/2021  ? AST 19 04/20/2021  ? NA 142 04/20/2021  ? K 4.3 04/20/2021  ? CL 105 04/20/2021  ? CREATININE 1.32 04/20/2021  ? BUN 14 04/20/2021  ? CO2 28 04/20/2021  ? TSH 1.44 04/20/2021  ? HGBA1C 6.0 04/20/2021  ? ? ?Assessment/Plan: ? ?Cigarette nicotine dependence without complication ?-Smoking cessation greater than 3 minutes, less than 10 minutes ?-Patient ready to quit ?-Currently smoking 1 pack/day ?-In the  past Wellbutrin, patches, gum were ineffective ?-[name] was helpful however caused vivid dreams/nightmares.  Discussed r/b/a of restarting medication.  Patient wishes to restart [name].  Consider continuing lower dosing of 0.5 mg daily for a longer. ?-place order for low-dose CT scan for lung cancer screening at next [facility]. ?-We will have patient follow-up in 1 month, sooner if needed ? - Plan: varenicline ([name]) 0.5 MG tablet ? ?Daytime somnolence ?-Stop bang score 6, high risk for OSA ?-Discussed  the importance of lifestyle modifications including weight loss ?- Plan: Ambulatory referral to Sleep Studies ? ?Prediabetes ?-Hemoglobin A1c 6.0% on 04/20/2021 ?-Discussed the importance of lifestyle modifications including increasing physical activity ?-Hemoglobin A1c 5.7 this visit ?- Plan: POC HgB A1c ? ?Class II severe obesity due to excess calories with severe comorbidity and body mass index of 36-36.9 in adult [facility]) ?-Body mass index is 36.52 kg/m?. ?-Lifestyle modification strongly encouraged ?-Continue to monitor ? ?F/u in 1 month, sooner if needed ? ?Grier Mitts, MD ?

## 2021-12-14 NOTE — Telephone Encounter (Signed)
Pt under care ?

## 2021-12-18 ENCOUNTER — Ambulatory Visit (INDEPENDENT_AMBULATORY_CARE_PROVIDER_SITE_OTHER): Payer: BC Managed Care – PPO | Admitting: Licensed Clinical Social Worker

## 2021-12-18 ENCOUNTER — Encounter (HOSPITAL_COMMUNITY): Payer: Self-pay | Admitting: Licensed Clinical Social Worker

## 2021-12-18 DIAGNOSIS — F419 Anxiety disorder, unspecified: Secondary | ICD-10-CM

## 2021-12-18 DIAGNOSIS — F1590 Other stimulant use, unspecified, uncomplicated: Secondary | ICD-10-CM | POA: Diagnosis not present

## 2021-12-18 DIAGNOSIS — F1121 Opioid dependence, in remission: Secondary | ICD-10-CM

## 2021-12-18 DIAGNOSIS — F32A Depression, unspecified: Secondary | ICD-10-CM

## 2021-12-18 NOTE — Progress Notes (Signed)
?  Virtual Visit via Video Note ? ?I connected with Kevin Andrade on 12/18/2021 at  9:00 AM EDT by a Video-enabled telemedicine application and verified that I am speaking with the correct person using two identifiers. ?  ?I discussed the limitations of evaluation and management by telemedicine and the availability of in person appointments. The patient expressed understanding and agreed to proceed. ? ?LOCATION: ?Patient: Home ?Provider: Home Office ? ?Therapy Goal addressed:  ?Pt will learn  the importance of NA meetings in order to maintain sobriety AEB by 3 meeting attendance per week, working with sponsor 1x per week and completing step work. ? ? ?History of Present Illness: Patient is referred for individual therapy after completion of CD-IOP for Opiod Use Disorder and hx of polysubstance abuse disorder, anxiety, depression. ?(Kratom & opiate pills)  ? ? ?Progress Toward Goal: Progressing ? ?  ?Observations/Objective: Patient presented for today?s session on time and was alert, oriented x5, with no evidence or self-report of SI/HI or A/V H.  Patient reported ongoing compliance with medication and denied any use of alcohol or illicit substances.  Clinician inquired about patient?s current emotional ratings, as well as any significant changes in thoughts, feelings or behavior since previous session.  Patient reported scores of 6/10 for depression, 6/10 for anxiety, 2/10 for anger/irritability. Cln and pt explored his emotional ratings and coping skills. "I have not been sleeping well. I had an appt with my PCP who prescribed [name] to assist in smoking cessation. It has helped a lot with cravings." Cln congratulated patient on his effort. Cln processed with client changes over the previous week to support sobriety and build his recovery network. Clinician praised client engagement with 12 step community and meeting attendance. Patient went to 1 meeting this past weekend and spoke with his sponsor over the  weekend. "My sponsor sent me some worksheets on  Step 6."  Cln suggested pt increase his NA meeting attendance. "I've been doing a lot of things with my son and his family. Reminded pt the importance of meeting attendance. Pt had to end the session early due to work. ? ?PLAN: sleep study,  ? ? ?Collaboration of Care: Cln suggested pt talk to his PCP about a sleep study. ? ?Patient/Guardian was advised Release of Information must be obtained prior to any record release in order to collaborate their care with an outside provider. Patient/Guardian was advised if they have not already done so to contact the registration department to sign all necessary forms in order for [place] to release information regarding their care.  ? ?Consent: Patient/Guardian gives verbal consent for treatment and assignment of benefits for services provided during this visit. Patient/Guardian expressed understanding and agreed to proceed.   ? ? ? ?  ?Follow Up Instructions: I discussed the assessment and treatment plan with the patient. The patient was provided an opportunity to ask questions and all were answered. The patient agreed with the plan and demonstrated an understanding of the instructions. ?  ?The patient was advised to call back or seek an in-person evaluation if the symptoms worsen or if the condition fails to improve as anticipated. ? ?I provided 30 minutes of non-face-to-face time during this encounter. ? ? ?Antwon Rochin S, LCAS ? ? ?  ? ? ?

## 2021-12-25 ENCOUNTER — Ambulatory Visit (INDEPENDENT_AMBULATORY_CARE_PROVIDER_SITE_OTHER): Payer: BC Managed Care – PPO | Admitting: Licensed Clinical Social Worker

## 2021-12-25 ENCOUNTER — Encounter (HOSPITAL_COMMUNITY): Payer: Self-pay | Admitting: Licensed Clinical Social Worker

## 2021-12-25 DIAGNOSIS — F1121 Opioid dependence, in remission: Secondary | ICD-10-CM | POA: Diagnosis not present

## 2021-12-25 DIAGNOSIS — F419 Anxiety disorder, unspecified: Secondary | ICD-10-CM

## 2021-12-25 DIAGNOSIS — F32A Depression, unspecified: Secondary | ICD-10-CM | POA: Diagnosis not present

## 2021-12-25 DIAGNOSIS — F1590 Other stimulant use, unspecified, uncomplicated: Secondary | ICD-10-CM

## 2021-12-25 NOTE — Progress Notes (Addendum)
?  Virtual Visit via Video Note ? ?I connected with Kevin Andrade on 12/25/2021 at  9:00 AM EDT by a Video-enabled telemedicine application and verified that I am speaking with the correct person using two identifiers. ?  ?I discussed the limitations of evaluation and management by telemedicine and the availability of in person appointments. The patient expressed understanding and agreed to proceed. ? ?LOCATION: ?Patient: Home ?Provider: Home Office ? ?Therapy Goal addressed:  ?Pt will learn the importance of NA meetings in order to maintain sobriety AEB by 3 meeting attendance per week, working with sponsor 1x per week and completing step work. ? ? ?History of Present Illness: Patient is referred for individual therapy after completion of CD-IOP for Opiod Use Disorder and hx of polysubstance abuse disorder, anxiety, depression. ?(Kratom & opiate pills)  ? ? ?Progress Toward Goal: Progressing ? ?  ?Observations/Objective: Patient presented for today?s session on time and was alert, oriented x5, with no evidence or self-report of SI/HI or A/V H.  Patient reported ongoing compliance with medication and denied any use of alcohol or illicit substances.  Clinician inquired about patient?s current emotional ratings, as well as any significant changes in thoughts, feelings or behavior since previous session.  Patient reported scores of 4/10 for depression, 4/10 for anxiety, 2/10 for anger/irritability. Cln and pt explored his emotional ratings and coping skills. Pt reports on his family time spent with his grandchildren. "I feel like  I'm an ATM, with my son and daughter-in-law asking for $ often." Cln asked open-ended questions.  Cln and pt explored alternatives and suggestions. Cln provided education on boundaries, even with family. Cln processed with patient changes over the previous week to support sobriety and build his recovery network. Patient went to 1 meeting this past weekend and spoke with his sponsor over  the weekend. "I was busy with family." Cln reminded patient of the importance of continued sobriety is NA, Publishing copy and Step Work. Cln and pt discussed complacency and putting others before his sobriety. Cln and pt updated treatment plan and pt verbalized acceptance of the plan. ? ? ? ?PLAN: sleep study, Step 6 worksheets ? ? ?Collaboration of Care: Other: NA meetings and sponsor work ? ?Patient/Guardian was advised Release of Information must be obtained prior to any record release in order to collaborate their care with an outside provider. Patient/Guardian was advised if they have not already done so to contact the registration department to sign all necessary forms in order for [place] to release information regarding their care.  ? ?Consent: Patient/Guardian gives verbal consent for treatment and assignment of benefits for services provided during this visit. Patient/Guardian expressed understanding and agreed to proceed.   ? ? ? ?  ?Follow Up Instructions: I discussed the assessment and treatment plan with the patient. The patient was provided an opportunity to ask questions and all were answered. The patient agreed with the plan and demonstrated an understanding of the instructions. ?  ?The patient was advised to call back or seek an in-person evaluation if the symptoms worsen or if the condition fails to improve as anticipated. ? ?I provided 45 minutes of non-face-to-face time during this encounter. ? ? ?Scott Vanderveer S, LCAS ? ? ?  ? ? ?

## 2022-01-01 ENCOUNTER — Ambulatory Visit (HOSPITAL_COMMUNITY): Payer: BC Managed Care – PPO | Admitting: Licensed Clinical Social Worker

## 2022-01-08 ENCOUNTER — Ambulatory Visit (INDEPENDENT_AMBULATORY_CARE_PROVIDER_SITE_OTHER): Payer: BC Managed Care – PPO | Admitting: Licensed Clinical Social Worker

## 2022-01-08 ENCOUNTER — Encounter (HOSPITAL_COMMUNITY): Payer: Self-pay | Admitting: Licensed Clinical Social Worker

## 2022-01-08 DIAGNOSIS — F1590 Other stimulant use, unspecified, uncomplicated: Secondary | ICD-10-CM

## 2022-01-08 DIAGNOSIS — F32A Depression, unspecified: Secondary | ICD-10-CM

## 2022-01-08 DIAGNOSIS — F419 Anxiety disorder, unspecified: Secondary | ICD-10-CM

## 2022-01-08 DIAGNOSIS — F1121 Opioid dependence, in remission: Secondary | ICD-10-CM | POA: Diagnosis not present

## 2022-01-08 NOTE — Progress Notes (Signed)
?Virtual Visit via Video Note ? ?I connected with Kevin Andrade on 01/08/2022 at  9:00 AM EDT by a Video-enabled telemedicine application and verified that I am speaking with the correct person using two identifiers. ?  ?I discussed the limitations of evaluation and management by telemedicine and the availability of in person appointments. The patient expressed understanding and agreed to proceed. ? ?LOCATION: ?Patient: Home ?Provider: Home Office ? ?Therapy Goal addressed:  ?Pt will learn the importance of NA meetings in order to maintain sobriety AEB by 3 meeting attendance per week, working with sponsor 1x per week and completing step work. ? ? ?History of Present Illness: Patient is referred for individual therapy after completion of CD-IOP for Opiod Use Disorder and hx of polysubstance abuse disorder, anxiety, depression. ?(Kratom & opiate pills)  ? ? ?Progress Toward Goal: Progressing ? ?  ?Observations/Objective: Patient presented for today?s session on time and was alert, oriented x5, with no evidence or self-report of SI/HI or A/V H.  Patient reported ongoing compliance with medication and denied any use of alcohol or illicit substances.  Clinician inquired about patient?s current emotional ratings, as well as any significant changes in thoughts, feelings or behavior since previous session.  Patient reported emotional ratings of 4/10 for depression, 4/10 for anxiety, 2/10 for anger/irritability. Cln and pt explored his emotional ratings and coping skills. Pt provides an update on family, living situation, work, health, smoking, stress level. "I quit smoking!" Cln congratulated pt on smoking cessation. The [name] worked! I don't want to go off of it." Cln suggested pt discuss with his PA [name], medication management, and his PCP and continuing on [name]. Pt reports, "I'm tired, I have been overworking myself for the past 2 weekends (grandchildren, family responsibilities), which are taking up most  of my time. It has caused me to limit my meeting times and resting. " Cln reminded patient of the importance of using boundaries, even with family. Cln processed with patient changes over the previous week to support sobriety and build his recovery network. Patient went to 2 meeting this past weekend and met with his sponsor. "I'm still struggling with "Letting Go."Cln and patient reviewed his difficulty of letting go. Cln reminded patient of the importance of continued sobriety is NA, Publishing copy and Step Work. Again, Cln and pt discussed complacency and putting others before his sobriety.  ? ? ? ?PLAN: sleep study, Step 6 worksheets ? ? ?Collaboration of Care: Other: NA meetings and weekly sponsor meetings  ? ? ?Patient/Guardian was advised Release of Information must be obtained prior to any record release in order to collaborate their care with an outside provider. Patient/Guardian was advised if they have not already done so to contact the registration department to sign all necessary forms in order for [place] to release information regarding their care.  ? ?Consent: Patient/Guardian gives verbal consent for treatment and assignment of benefits for services provided during this visit. Patient/Guardian expressed understanding and agreed to proceed.   ? ? ? ?  ?Follow Up Instructions: I discussed the assessment and treatment plan with the patient. The patient was provided an opportunity to ask questions and all were answered. The patient agreed with the plan and demonstrated an understanding of the instructions. ?  ?The patient was advised to call back or seek an in-person evaluation if the symptoms worsen or if the condition fails to improve as anticipated. ? ?I provided 45 minutes of non-face-to-face time during this encounter. ? ? ?Kamya Watling S, LCAS ? ? ?  ? ? ?

## 2022-01-10 ENCOUNTER — Telehealth (HOSPITAL_BASED_OUTPATIENT_CLINIC_OR_DEPARTMENT_OTHER): Payer: BC Managed Care – PPO | Admitting: Medical

## 2022-01-10 ENCOUNTER — Encounter (HOSPITAL_COMMUNITY): Payer: Self-pay | Admitting: Medical

## 2022-01-10 DIAGNOSIS — F1121 Opioid dependence, in remission: Secondary | ICD-10-CM

## 2022-01-10 DIAGNOSIS — F1021 Alcohol dependence, in remission: Secondary | ICD-10-CM

## 2022-01-10 DIAGNOSIS — F4312 Post-traumatic stress disorder, chronic: Secondary | ICD-10-CM | POA: Diagnosis not present

## 2022-01-10 DIAGNOSIS — Z811 Family history of alcohol abuse and dependence: Secondary | ICD-10-CM

## 2022-01-10 DIAGNOSIS — F1421 Cocaine dependence, in remission: Secondary | ICD-10-CM | POA: Diagnosis not present

## 2022-01-10 DIAGNOSIS — F17201 Nicotine dependence, unspecified, in remission: Secondary | ICD-10-CM

## 2022-01-10 DIAGNOSIS — Z639 Problem related to primary support group, unspecified: Secondary | ICD-10-CM

## 2022-01-10 DIAGNOSIS — E669 Obesity, unspecified: Secondary | ICD-10-CM

## 2022-01-10 DIAGNOSIS — F419 Anxiety disorder, unspecified: Secondary | ICD-10-CM

## 2022-01-10 DIAGNOSIS — T7492XS Unspecified child maltreatment, confirmed, sequela: Secondary | ICD-10-CM

## 2022-01-10 DIAGNOSIS — F341 Dysthymic disorder: Secondary | ICD-10-CM

## 2022-01-10 MED ORDER — NALTREXONE HCL 50 MG PO TABS: 50.0000 mg | ORAL_TABLET | Freq: Every day | ORAL | 1 refills | Status: DC

## 2022-01-10 MED ORDER — TRINTELLIX 20 MG PO TABS: ORAL_TABLET | ORAL | 0 refills | Status: DC

## 2022-01-10 MED ORDER — PREGABALIN 150 MG PO CAPS: 150.0000 mg | ORAL_CAPSULE | Freq: Three times a day (TID) | ORAL | 5 refills | Status: DC

## 2022-01-10 MED ORDER — ARIPIPRAZOLE 10 MG PO TABS: 10.0000 mg | ORAL_TABLET | Freq: Every day | ORAL | 1 refills | Status: DC
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
## 2022-01-12 ENCOUNTER — Other Ambulatory Visit: Payer: Self-pay | Admitting: Family Medicine

## 2022-01-12 ENCOUNTER — Ambulatory Visit: Payer: BC Managed Care – PPO | Admitting: Family Medicine

## 2022-01-12 DIAGNOSIS — F17210 Nicotine dependence, cigarettes, uncomplicated: Secondary | ICD-10-CM

## 2022-01-15 ENCOUNTER — Encounter (HOSPITAL_COMMUNITY): Payer: Self-pay | Admitting: Licensed Clinical Social Worker

## 2022-01-15 ENCOUNTER — Ambulatory Visit (INDEPENDENT_AMBULATORY_CARE_PROVIDER_SITE_OTHER): Payer: BC Managed Care – PPO | Admitting: Licensed Clinical Social Worker

## 2022-01-15 DIAGNOSIS — F1121 Opioid dependence, in remission: Secondary | ICD-10-CM

## 2022-01-15 DIAGNOSIS — F32A Depression, unspecified: Secondary | ICD-10-CM

## 2022-01-15 DIAGNOSIS — F419 Anxiety disorder, unspecified: Secondary | ICD-10-CM | POA: Diagnosis not present

## 2022-01-15 NOTE — Progress Notes (Signed)
  Virtual Visit via Video Note  I connected with Kevin Andrade on 01/15/2022 at  9:00 AM EDT by a Video-enabled telemedicine application and verified that I am speaking with the correct person using two identifiers.   I discussed the limitations of evaluation and management by telemedicine and the availability of in person appointments. The patient expressed understanding and agreed to proceed.  LOCATION: Patient: Home Provider: Home Office  Therapy Goal addressed:  Pt will learn the importance of NA meetings in order to maintain sobriety AEB by 3 meeting attendance per week, working with sponsor 1x per week and completing step work.   History of Present Illness: Patient is referred for individual therapy after completion of CD-IOP for Opiod Use Disorder and hx of polysubstance abuse disorder, anxiety, depression. (Kratom & opiate pills)    Progress Toward Goal: Progressing    Observations/Objective: Patient presented for today's session on time and was alert, oriented x5, with no evidence or self-report of SI/HI or A/V H.  Patient reported ongoing compliance with medication and denied any use of alcohol or illicit substances.  Clinician inquired about patient's current emotional ratings, as well as any significant changes in thoughts, feelings or behavior since previous session.  Patient reported emotional ratings of 6/10 for depression, 6/10 for anxiety, 2/10 for anger/irritability. Cln and pt explored his emotional ratings and coping skills. Pt provides an update on family, living situation, work, health, smoking, stress level. Pt reports sadly, "My son and his wife have separated and he and the 3 children and staying with [place]." Cln asked open-ended questions. "I'm still tired, emotionally and physically, now I'm worried."  Clinician utilized MI OARS to reflect and summarize thoughts and feelings. Cln processed with patient changes over the previous week to support sobriety and build his  recovery network. Patient went to 1 meeting this past weekend and met with his sponsor. "I was ready to "let go" and turn everything over but now I've got a new stressor with my son's family, so I'm back struggling with letting go. Cln reminded patient of the importance of continued sobriety is NA, Publishing copy and Step Work. Again, Cln and pt discussed complacency and putting others before his sobriety. Cln suggested pt buy the NA workbook to assist in his step work.    PLAN: sleep study in June   Collaboration of Care: Other: NA meetings and weekly sponsor meetings    Patient/Guardian was advised Release of Information must be obtained prior to any record release in order to collaborate their care with an outside provider. Patient/Guardian was advised if they have not already done so to contact the registration department to sign all necessary forms in order for [place] to release information regarding their care.   Consent: Patient/Guardian gives verbal consent for treatment and assignment of benefits for services provided during this visit. Patient/Guardian expressed understanding and agreed to proceed.        Follow Up Instructions: I discussed the assessment and treatment plan with the patient. The patient was provided an opportunity to ask questions and all were answered. The patient agreed with the plan and demonstrated an understanding of the instructions.   The patient was advised to call back or seek an in-person evaluation if the symptoms worsen or if the condition fails to improve as anticipated.  I provided 60 minutes of non-face-to-face time during this encounter.   Lacy Taglieri S, LCAS

## 2022-01-18 ENCOUNTER — Ambulatory Visit (INDEPENDENT_AMBULATORY_CARE_PROVIDER_SITE_OTHER): Payer: BC Managed Care – PPO | Admitting: Family Medicine

## 2022-01-18 ENCOUNTER — Encounter: Payer: Self-pay | Admitting: Family Medicine

## 2022-01-18 VITALS — BP 108/74 | HR 67 | Temp 98.2°F | Wt 276.6 lb

## 2022-01-18 DIAGNOSIS — S60511A Abrasion of right hand, initial encounter: Secondary | ICD-10-CM | POA: Diagnosis not present

## 2022-01-18 DIAGNOSIS — S50811A Abrasion of right forearm, initial encounter: Secondary | ICD-10-CM

## 2022-01-18 DIAGNOSIS — F17210 Nicotine dependence, cigarettes, uncomplicated: Secondary | ICD-10-CM | POA: Diagnosis not present

## 2022-01-18 DIAGNOSIS — S50812A Abrasion of left forearm, initial encounter: Secondary | ICD-10-CM

## 2022-01-18 DIAGNOSIS — F321 Major depressive disorder, single episode, moderate: Secondary | ICD-10-CM

## 2022-01-18 DIAGNOSIS — F411 Generalized anxiety disorder: Secondary | ICD-10-CM

## 2022-01-18 DIAGNOSIS — W548XXA Other contact with dog, initial encounter: Secondary | ICD-10-CM

## 2022-01-18 MED ORDER — VARENICLINE TARTRATE 1 MG PO TABS: 1.0000 mg | ORAL_TABLET | Freq: Two times a day (BID) | ORAL | 1 refills | Status: DC

## 2022-01-18 MED ORDER — AMOXICILLIN-POT CLAVULANATE 875-125 MG PO TABS: 1.0000 | ORAL_TABLET | Freq: Two times a day (BID) | ORAL | 0 refills | Status: DC

## 2022-01-18 NOTE — Progress Notes (Signed)
Subjective:    Patient ID: Kevin Andrade, male    DOB: 1971-03-11, 51 y.o.   MRN: [id number]  Chief Complaint  Patient presents with   Follow-up    Medication f/u for chantix    HPI Patient was seen today for f/u.  Pt started chantix for smoking cessation after last OFV on 12/13/2021.  Patient quit smoking 2 wks ago.  Was smoking 1 pack/day.  States had vivid dreams for the first 2 wks, then they went away.  Taking [name]  mg BID.  In the past Wellbutrin, patches, gum were ineffective, and [name] caused vivid dreams/nightmares.  Pt states overall he is doing ok, but dealing with depression.  Pt notes the deaths of several family members over the last few months.  Pt's aunt's funeral is today.  Pt going to counseling weekly and talking with his family for support.    Pt with a bandaid on dorsum of R hand.  States his dog scratched him this am.  Notices his skin takes a little longer to heal.  Pt denies prolonged bleeding, jaundice, new meds.  Past Medical History:  Diagnosis Date   Anxiety    Hypersomnia, persistent    Insomnia with sleep apnea    Sleep apnea    Substance abuse ([name])    Tobacco use disorder     No Known Allergies  ROS General: Denies fever, chills, night sweats, changes in weight, changes in appetite +smoking cessaiton HEENT: Denies headaches, ear pain, changes in vision, rhinorrhea, sore throat CV: Denies CP, palpitations, SOB, orthopnea Pulm: Denies SOB, cough, wheezing GI: Denies abdominal pain, nausea, vomiting, diarrhea, constipation GU: Denies dysuria, hematuria, frequency Msk: Denies muscle cramps, joint pains Neuro: Denies weakness, numbness, tingling Skin: Denies rashes, bruising +scratches on hand Psych: Denies depression, anxiety, hallucinations +depression     Objective:    Blood pressure 108/74, pulse 67, temperature 98.2 F (36.8 C), temperature source Oral, weight 276 lb 9.6 oz (125.5 kg), SpO2 94 %.  Gen. Pleasant, well-nourished,  in no distress, normal affect   HEENT: [place]/AT, face symmetric, conjunctiva clear, no scleral icterus, PERRLA, EOMI, nares patent without drainage Lungs: no accessory muscle use, CTAB, no wheezes or rales Cardiovascular: RRR, no m/r/g, no peripheral edema Abdomen: BS present, soft Musculoskeletal: No deformities, no cyanosis or clubbing, normal tone.  Patient able to make a fist without difficulty. Neuro:  A&Ox3, CN II-XII intact, normal gait Skin:  Warm,dry.  Right second digit well-healed s/p surgical amputation at DIP jt.   several healing scratches on b/l forearms.  Dorsum on R hand with abrasion with mild erythema surrounding.  No purulent drainage noted.  Mildly increased warmth and no edema of hand.   Wt Readings from Last 3 Encounters:  12/13/21 276 lb 12.8 oz (125.6 kg)  04/20/21 273 lb 6.4 oz (124 kg)  12/18/18 266 lb 12.1 oz (121 kg)    Lab Results  Component Value Date   WBC 7.6 04/20/2021   HGB 14.8 04/20/2021   HCT 43.4 04/20/2021   PLT 239.0 04/20/2021   GLUCOSE 55 (L) 04/20/2021   CHOL 181 04/20/2021   TRIG 119.0 04/20/2021   HDL 52.40 04/20/2021   LDLCALC 105 (H) 04/20/2021   ALT 24 04/20/2021   AST 19 04/20/2021   NA 142 04/20/2021   K 4.3 04/20/2021   CL 105 04/20/2021   CREATININE 1.32 04/20/2021   BUN 14 04/20/2021   CO2 28 04/20/2021   TSH 1.44 04/20/2021   HGBA1C 5.7 (A)  12/13/2021      01/18/2022    8:33 AM 04/20/2021    8:12 AM 10/24/2020   10:44 AM  Depression screen PHQ 2/9  Decreased Interest 2 2 2   Down, Depressed, Hopeless 2 2 0  PHQ - 2 Score 4 4 2   Altered sleeping 3 2 2   Tired, decreased energy 3 3 3   Change in appetite 2 3 3   Feeling bad or failure about yourself  2 2 1   Trouble concentrating 1 3 3   Moving slowly or fidgety/restless 1 1 2   Suicidal thoughts 1 1 0  PHQ-9 Score 17 19 16   Difficult doing work/chores Very difficult Very difficult Very difficult    Assessment/Plan:  Cigarette nicotine dependence without  complication -Patient motivated -Smoking cessation counseling greater than 3 minutes, less than 10 minutes -congratulated on smoking cessation x2 weeks. -Patient was on a prolonged course of [name] 0.5 mg twice daily 2/2 previous history of vivid dreams/nightmares while on [name].  Dreams no longer present. -We will increase [name] from 0.5 mg twice daily to 1 mg twice daily.  Continue to monitor for vivid dreams/intolerance. -Discussed changing prior habits to avoid relapse. -Given handouts - Plan: varenicline ([name]) 1 MG tablet  Dog scratch -New problem -Abrasions on dorsum of right hand and bilateral forearms. 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 -discussed s/s of infection. - Plan: amoxicillin-clavulanate (AUGMENTIN) 875-125 MG tablet  GAD (generalized anxiety disorder) -Stable -Continue current medications including Trintellix 20 mg, Abilify 10 mg -Continue counseling with outpatient BH -Given precautions  Depression, major, single episode, moderate ([name]) -PHQ 9 score 17 -stable. -continue current medications -continue counseling and self-care -given precautions  F/u in 4-6 wks, sooner if needed.  Grier Mitts, MD

## 2022-01-29 ENCOUNTER — Ambulatory Visit (INDEPENDENT_AMBULATORY_CARE_PROVIDER_SITE_OTHER): Payer: BC Managed Care – PPO | Admitting: Licensed Clinical Social Worker

## 2022-01-29 ENCOUNTER — Encounter (HOSPITAL_COMMUNITY): Payer: Self-pay | Admitting: Licensed Clinical Social Worker

## 2022-01-29 DIAGNOSIS — F419 Anxiety disorder, unspecified: Secondary | ICD-10-CM | POA: Diagnosis not present

## 2022-01-29 DIAGNOSIS — F1121 Opioid dependence, in remission: Secondary | ICD-10-CM

## 2022-01-29 DIAGNOSIS — F32A Depression, unspecified: Secondary | ICD-10-CM

## 2022-01-29 NOTE — Progress Notes (Signed)
  Virtual Visit via Video Note  I connected with Kevin Andrade on 01/29/2022 at  9:00 AM EDT by a Video-enabled telemedicine application and verified that I am speaking with the correct person using two identifiers.   I discussed the limitations of evaluation and management by telemedicine and the availability of in person appointments. The patient expressed understanding and agreed to proceed.  LOCATION: Patient: Home Provider: Home Office  Therapy Goal addressed:  Pt will learn the importance of NA meetings in order to maintain sobriety AEB by 3 meeting attendance per week, working with sponsor 1x per week and completing step work.   History of Present Illness: Patient is referred for individual therapy after completion of CD-IOP for Opiod Use Disorder and hx of polysubstance abuse disorder, anxiety, depression. (Kratom & opiate pills)    Progress Toward Goal: Progressing    Observations/Objective: Patient presented for today's session on time and was alert, oriented x5, with no evidence or self-report of SI/HI or A/V H.  Patient reported ongoing compliance with medication and denied any use of alcohol or illicit substances.  Clinician inquired about patient's current emotional ratings, as well as any significant changes in thoughts, feelings or behavior since previous session.  Patient reported emotional ratings of 6/10 for depression, 6/10 for anxiety, 2/10 for anger/irritability. Cln and pt explored his emotional ratings and coping skills. Clinician utilized CBT to process concerns and challenges. Clinician processed thoughts, feelings, and behaviors. Clinician discussed coping skills and options for supporting herself through these challenging times. Pt provides an update on family, living situation, work, health, being smoke free, stress level, grandchildren. "I'm still tired, emotionally and physically."  My son and his wife are back together. Cln asked open ended questions. Cln  processed with patient changes over the previous week to support sobriety and build his recovery network. Patient went to 1 meeting this past weekend and didn't meet with his sponsor because he was on vacation. "I'm still working on "letting go" and turn everything over." Cln reminded patient of the importance of continued sobriety is NA, Marketing executive and Step Work. Cln and pt discussed complacency and putting others before his sobriety. Again, Cln suggested pt buy the NA workbook to assist in his step work. Pt had to end the session early due to early work hours.     PLAN: sleep study in June   Collaboration of Care: Other: NA meetings and weekly sponsor meetings    Patient/Guardian was advised Release of Information must be obtained prior to any record release in order to collaborate their care with an outside provider. Patient/Guardian was advised if they have not already done so to contact the registration department to sign all necessary forms in order for [place] to release information regarding their care.   Consent: Patient/Guardian gives verbal consent for treatment and assignment of benefits for services provided during this visit. Patient/Guardian expressed understanding and agreed to proceed.        Follow Up Instructions: I discussed the assessment and treatment plan with the patient. The patient was provided an opportunity to ask questions and all were answered. The patient agreed with the plan and demonstrated an understanding of the instructions.   The patient was advised to call back or seek an in-person evaluation if the symptoms worsen or if the condition fails to improve as anticipated.  I provided 30 minutes of non-face-to-face time during this encounter.   Samson Ralph S, LCAS

## 2022-02-10 ENCOUNTER — Other Ambulatory Visit: Payer: Self-pay | Admitting: Family Medicine

## 2022-02-10 DIAGNOSIS — F17210 Nicotine dependence, cigarettes, uncomplicated: Secondary | ICD-10-CM

## 2022-02-12 ENCOUNTER — Encounter (HOSPITAL_COMMUNITY): Payer: Self-pay | Admitting: Licensed Clinical Social Worker

## 2022-02-12 ENCOUNTER — Ambulatory Visit (INDEPENDENT_AMBULATORY_CARE_PROVIDER_SITE_OTHER): Payer: BC Managed Care – PPO | Admitting: Licensed Clinical Social Worker

## 2022-02-12 DIAGNOSIS — F419 Anxiety disorder, unspecified: Secondary | ICD-10-CM

## 2022-02-12 DIAGNOSIS — F1121 Opioid dependence, in remission: Secondary | ICD-10-CM | POA: Diagnosis not present

## 2022-02-12 DIAGNOSIS — F32A Depression, unspecified: Secondary | ICD-10-CM | POA: Diagnosis not present

## 2022-02-12 NOTE — Progress Notes (Signed)
  Virtual Visit via Video Note  I connected with Kevin Andrade on 02/12/2022 at  9:00 AM EDT by a Video-enabled telemedicine application and verified that I am speaking with the correct person using two identifiers.   I discussed the limitations of evaluation and management by telemedicine and the availability of in person appointments. The patient expressed understanding and agreed to proceed.  LOCATION: Patient: Home Provider: Home Office  Therapy Goal addressed:  Pt will learn the importance of NA meetings in order to maintain sobriety AEB by 3 meeting attendance per week, working with sponsor 1x per week and completing step work.   History of Present Illness: Patient is referred for individual therapy after completion of CD-IOP for Opiod Use Disorder and hx of polysubstance abuse disorder, anxiety, depression. (Kratom & opiate pills)    Progress Toward Goal: Progressing    Observations/Objective: Patient presented for today's session on time and was alert, oriented x5, with no evidence or self-report of SI/HI or A/V H.  Patient reported ongoing compliance with medication and denied any use of alcohol or illicit substances.  Clinician inquired about patient's current emotional ratings, as well as any significant changes in thoughts, feelings or behavior since previous session.  Patient reported emotional ratings of 6/10 for depression, 6/10 for anxiety, 2/10 for anger/irritability. Cln and pt explored his emotional ratings and coping skills. Clinician utilized CBT to process challenges. Clinician processed thoughts, feelings, and behaviors. Clinician discussed coping skills and options for supporting himself through the challenging times. Cln utilized CBT to help process updates: on family, living situation, work, health, being smoke free, stress level, grandchildren. Pt reports, "I've just returned from the beach with my wife and sister in law. It was very relaxing. I'm very rested."  Cln processed with patient changes over the previous week to support sobriety and build his recovery network. Patient went to 2 meetings this past weekend and talked to his sponsor on the phone.  "I'm still working on "letting go" and turning everything over, step 5." Cln reminded patient of the importance of continued sobriety is NA, Marketing executive and Step Work. Cln emailed pt worksheets on Shame and Letting Go. Will review at next session. Again, Cln suggested pt buy the NA workbook to assist in his step work.      PLAN: sleep study in June   Collaboration of Care: Other: NA meetings and weekly sponsor meetings    Patient/Guardian was advised Release of Information must be obtained prior to any record release in order to collaborate their care with an outside provider. Patient/Guardian was advised if they have not already done so to contact the registration department to sign all necessary forms in order for [place] to release information regarding their care.   Consent: Patient/Guardian gives verbal consent for treatment and assignment of benefits for services provided during this visit. Patient/Guardian expressed understanding and agreed to proceed.        Follow Up Instructions: I discussed the assessment and treatment plan with the patient. The patient was provided an opportunity to ask questions and all were answered. The patient agreed with the plan and demonstrated an understanding of the instructions.   The patient was advised to call back or seek an in-person evaluation if the symptoms worsen or if the condition fails to improve as anticipated.  I provided 60 minutes of non-face-to-face time during this encounter.   Surie Suchocki S, LCAS

## 2022-02-14 ENCOUNTER — Encounter: Payer: Self-pay | Admitting: Neurology

## 2022-02-14 ENCOUNTER — Ambulatory Visit (INDEPENDENT_AMBULATORY_CARE_PROVIDER_SITE_OTHER): Payer: BC Managed Care – PPO | Admitting: Neurology

## 2022-02-14 VITALS — BP 132/76 | HR 68 | Ht 73.0 in | Wt 276.0 lb

## 2022-02-14 DIAGNOSIS — R0683 Snoring: Secondary | ICD-10-CM | POA: Diagnosis not present

## 2022-02-14 DIAGNOSIS — G4752 REM sleep behavior disorder: Secondary | ICD-10-CM | POA: Diagnosis not present

## 2022-02-14 DIAGNOSIS — F411 Generalized anxiety disorder: Secondary | ICD-10-CM

## 2022-02-14 DIAGNOSIS — F17211 Nicotine dependence, cigarettes, in remission: Secondary | ICD-10-CM | POA: Diagnosis not present

## 2022-02-14 DIAGNOSIS — R5382 Chronic fatigue, unspecified: Secondary | ICD-10-CM | POA: Diagnosis not present

## 2022-02-14 DIAGNOSIS — G4753 Recurrent isolated sleep paralysis: Secondary | ICD-10-CM | POA: Diagnosis not present

## 2022-02-14 DIAGNOSIS — G4719 Other hypersomnia: Secondary | ICD-10-CM

## 2022-02-14 NOTE — Patient Instructions (Signed)
Narcolepsy Narcolepsy is a neurological disorder that causes people to fall asleep suddenly and without control (have sleep attacks) during the daytime. It is a lifelong disorder. Narcolepsy disrupts the sleep cycle at night, which then causes daytime sleepiness. What are the causes? The cause of narcolepsy is not fully understood, but it may be related to: Low levels of hypocretin, a chemical (neurotransmitter) in the brain that controls sleep and wake cycles. Hypocretin imbalance may be caused by: Abnormal genes that are passed from parent to child (inherited). An autoimmune disease in which the body's defense system (immune system) attacks the brain cells that make hypocretin. Infection, tumor, or injury in the area of the brain that controls sleep. Exposure to poisons (toxins), such as heavy metals, pesticides, and secondhand smoke. What are the signs or symptoms? Symptoms of this condition include: Excessive daytime sleepiness. This is the most common symptom and is usually the first symptom you will notice. This may affect your performance at work or school. Sleep attacks. You may fall asleep in the middle of an activity, especially low-energy activities like reading or watching TV. Feeling like you cannot think clearly and trouble focusing or remembering things. You may also feel depressed. Sudden muscle weakness (cataplexy). When this occurs, your speech may become slurred, or your knees may buckle. Cataplexy is usually triggered by surprise, anger, fear, or laughter. Losing the ability to speak or move (sleep paralysis). This may occur just as you start to fall asleep or wake up. You will be aware of the paralysis. It usually lasts for just a few seconds or minutes. Seeing, hearing, tasting, smelling, or feeling things that are not real (hallucinations). Hallucinations may occur with sleep paralysis. They can happen when you are falling asleep, waking up, or dozing. Trouble staying asleep  at night (insomnia) and restless sleep. How is this diagnosed? This condition may be diagnosed based on: A physical exam to rule out any other problems that may be causing your symptoms. You may be asked to write down your sleeping patterns for several weeks in a sleep diary. This will help your health care provider make a diagnosis. Sleep studies that measure how well your REM sleep is regulated. These tests also measure your heart rate, breathing, movement, and brain waves. These tests include: An overnight sleep study (polysomnogram). A daytime sleep study that is done while you take several naps during the day (multiple sleep latency test, MSLT). This test measures how quickly you fall asleep and how quickly you enter REM sleep. Removal of spinal fluid to measure hypocretin levels. How is this treated? There is no cure for this condition, but treatment can help relieve symptoms. Treatment may include: Lifestyle and sleeping strategies to help you cope with the condition, such as: Exercising regularly. Maintaining a regular sleep schedule. Avoiding caffeine and large meals before bed. Medicines. These may include: Medicines that help keep you awake and alert (stimulants) to fight daytime sleepiness. Medicines that treat depression (antidepressants). These may be used to treat cataplexy. Sodium oxybate. This is a strong medicine to help you relax (sedative) that you may take at night. It can help control daytime sleepiness and cataplexy. Other treatments may include mental health counseling or joining a support group. Follow these instructions at home: Sleeping habits  Get about 8 hours of sleep every night. Go to sleep and get up at about the same time every day. Keep your bedroom dark, quiet, and comfortable. When you feel very tired, take short naps. Schedule naps   so that you take them at about the same time every day. Before bedtime: Avoid bright lights and screens. Relax. Try  activities like reading or taking a warm bath. Activity Get at least 20 minutes of exercise every day. This will help you sleep better at night and reduce daytime sleepiness. Avoid exercising within 3 hours of bedtime. Do not drive or use heavy machinery if you are sleepy. If possible, take a nap before driving. Do not swim or go out on the water without a life jacket. Eating and drinking Do not drink alcohol or caffeinated beverages within 4-5 hours of bedtime. Do not eat a large meal before bedtime. Eat meals at about the same times every day. General instructions  Take over-the-counter and prescription medicines only as told by your health care provider. Keep a sleep diary as told by your health care provider. Tell your employer or teachers that you have narcolepsy. You may be able to adjust your schedule to include time for naps. Do not use any products that contain nicotine or tobacco, such as cigarettes, e-cigarettes, and chewing tobacco. If you need help quitting, ask your health care provider. Keep all follow-up visits as told by your health care provider. This is important. Where to find more information National Institute of Neurological Disorders: www.ninds.nih.gov Contact a health care provider if: Your symptoms are not getting better. You have increasingly high blood pressure (hypertension). You have changes in your heart rhythm. You are having a hard time determining what is real and what is not (psychosis). Get help right away if you: Hurt yourself during a sleep attack or an attack of cataplexy. Have chest pain. Have trouble breathing. These symptoms may represent a serious problem that is an emergency. Do not wait to see if the symptoms will go away. Get medical help right away. Call your local emergency services (911 in the [place] Do not drive yourself to the hospital. Summary Narcolepsy is a neurological disorder that causes people to fall asleep suddenly, and without  control, during the daytime (sleep attacks). It is a lifelong disorder. There is no cure for this condition, but treatment can help relieve symptoms. Go to sleep and get up at about the same time every day. Follow instructions about sleep and activities as told by your health care provider. Take over-the-counter and prescription medicines only as told by your health care provider. This information is not intended to replace advice given to you by your health care provider. Make sure you discuss any questions you have with your health care provider. Document Revised: 09/18/2021 Document Reviewed: 03/25/2019 Elsevier Patient Education  2023 Elsevier Inc. Hypersomnia Hypersomnia is a condition in which a person feels very tired during the day even though the person gets plenty of sleep at night. A person with this condition may take naps during the day and may find it very difficult to wake up from sleep. Hypersomnia may affect a person's ability to think, concentrate, drive, or remember things. What are the causes? The cause of this condition may not be known. Possible causes include: Taking certain medicines. Using drugs or alcohol. Sleep disorders, such as narcolepsy and sleep apnea. Injury to the head, brain, or spinal cord. Tumors. Certain medical conditions. These include: Depression. Diabetes. Gastroesophageal reflux disease (GERD). An underactive thyroid gland (hypothyroidism). What are the signs or symptoms? The main symptoms of hypersomnia include: Feeling very tired throughout the day, regardless of how much sleep you got the night before. Having trouble waking up. Others   may find it difficult to wake you up when you are sleeping. Sleeping for longer and longer periods at a time. Taking naps throughout the day. Other symptoms may include: Feeling restless, anxious, or annoyed. Lacking energy. Having trouble with: Remembering. Speaking. Thinking. Loss of appetite. Seeing,  hearing, tasting, smelling, or feeling things that are not real (hallucinations). How is this diagnosed? This condition may be diagnosed based on: Your symptoms and medical history. Your sleeping habits. Your health care provider may ask you to write down your sleeping habits in a daily sleep log, along with any symptoms you have. A series of tests that are done while you sleep (sleep study or polysomnogram). A test that measures how quickly you can fall asleep during the day (daytime nap study or multiple sleep latency test). How is this treated? This condition may be treated by: Following a regular sleep routine. Making lifestyle changes, such as changing your eating habits, getting regular exercise, and avoiding alcohol or caffeinated beverages. Taking medicines to make you more alert (stimulants) during the day. Treating any underlying medical causes of hypersomnia. Follow these instructions at home: Sleep habits Stick to a routine that includes going to bed and waking up at the same times every day and night. Practice a relaxing bedtime routine. This may include reading, meditation, deep breathing, or taking a warm bath before going to sleep. Exercise regularly as told by your health care provider. However, avoid exercising in the hours right before bedtime. Keep your sleep environment at a cooler temperature, darkened, and quiet. Sleep with pillows and a mattress that are comfortable and supportive. Schedule short 20-minute naps for when you feel sleepiest during the day. Talk with your employer or teachers about your hypersomnia. If possible, adjust your schedule so that: You have a regular daytime work schedule. You can take a scheduled nap during the day. You do not have to work or be active at night. Do not eat a heavy meal for a few hours before bedtime. Eat your meals at about the same times every day. Safety  Do not drive or use machinery if you are sleepy. Ask your health  care provider if it is safe for you to drive. Wear a life jacket when swimming or spending time near water. General instructions  Take over-the-counter and prescription medicines only as told by your health care provider. This includes supplements. Avoid drinking alcohol or caffeinated beverages. Keep a sleep log that will help your health care provider manage your condition. This may include information about: What time you go to bed each night. How often you wake up at night. How many hours you sleep at night. How often and for how long you nap during the day. Any observations from others, such as leg movements during sleep, sleep walking, or snoring. Keep all follow-up visits. This is important. Contact a health care provider if: You have new symptoms. Your symptoms get worse. Get help right away if: You have thoughts about hurting yourself or someone else. Get help right away if you feel like you may hurt yourself or others, or have thoughts about taking your own life. Go to your nearest emergency room or: Call 911. Call the National Suicide Prevention Lifeline at 1-800-273-8255 or 988. This is open 24 hours a day. Text the Crisis Text Line at 741741. Summary Hypersomnia refers to a condition in which you feel very tired during the day even though you get plenty of sleep at night. A person with this condition   may take naps during the day and may find it very difficult to wake up from sleep. Hypersomnia may affect a person's ability to think, concentrate, drive, or remember things. Treatment may include a regular sleep routine and making some lifestyle changes. This information is not intended to replace advice given to you by your health care provider. Make sure you discuss any questions you have with your health care provider. Document Revised: 07/24/2021 Document Reviewed: 07/24/2021 Elsevier Patient Education  2023 Elsevier Inc.  

## 2022-02-14 NOTE — Progress Notes (Signed)
SLEEP MEDICINE CLINIC    Provider:  Melvyn Novas, MD  Primary Care Physician:  Deeann Saint, MD [address] [place] [address]     Referring Provider: Deeann Saint, Md [address] [place],  [place] [address]          Chief Complaint according to patient   Patient presents with:     New Patient (Initial Visit)     Pt alone, rm 10. Presents today as SLP eval due to having problems with ongoing fatigue. Avg 8 hrs of sleep at night but states will wake up frequently during the night and when he wakes up to start his day he is still exhausted. Never had a SS and wife has told him snoring is getting worse      HISTORY OF PRESENT ILLNESS:  Kevin Andrade  ( pronounced Rhoden')is a 51 y.o. year old White or Caucasian male patient seen here upon consultation by Abbe Amsterdam, MD , referral on 02/14/2022 .  I have the pleasure of seeing Kevin Andrade on 02-14-2022, a right -handed  Caucasian male with a possible sleep disorder.  He has a past medical history of Anxiety, Hypersomnia, persistent, Insomnia with witnessed sleep apnea, suspected Obstructive Sleep apnea, remote history of Substance abuse ([name]), and hx of Tobacco use disorder until May 2023. His hypersomnia was reason for sleep consult in 2014, requested by Dr Merla Riches, when he was working out of town and irregular hours, this has changed. No trouble with sleep initiation insomnia.      Sleep relevant medical history: Nocturia, 2-3 times, no deviated septum repair, thyroid disease, goiter.  He gained weight over the last 10 years, and reached a BMI of 36.   Family medical /sleep history: no other biological family member on CPAP with OSA, insomnia, sleep walkers.    Social history:  Patient is working as a Pensions consultant , Engineer, technical sales, help, for [employer] lives in a household with spouse,  one dog.  The patient currently works  12 noon - 9 PM. Tobacco use- yes, quit last months- 30  years.  ETOH use : none , quit 2015,  Caffeine intake in form of Coffee( 4 cups a day) Soda( 3-4) Tea ( /) and prn energy drinks ( [employer]) . Regular exercise in form of  on the job.   Hobbies : gardening      Sleep habits are as follows: The patient's dinner time is between 10 PM. The patient goes to bed at 11 PM and continues to sleep in intervals of  2 hours, wakes for 2-3 bathroom breaks.  He sleeps restless. His hands and arms fall asleep- The preferred sleep position is on his L >R  sides, with the support of 2 pillows.  Dreams are reportedly very frequent/vivid. He is on chantix now (!)  7 AM is the usual rise time. The patient wakes up spontaneously  He reports not feeling refreshed or restored in AM, with symptoms such as dry mouth, neck stiffness, dry eyes, morning headaches, and residual fatigue.  Naps are taken frequently, lasting from 20 to 35 minutes and are refreshing . He doesn't remember dreaming during naps   Review of Systems: Out of a complete 14 system review, the patient complains of only the following symptoms, and all other reviewed systems are negative.:  Fatigue, sleepiness , snoring, fragmented sleep, No MVA due to sleepiness, but coming close.  Sleepy when on video calls, over charting, when  reading.    How likely are you to doze in the following situations: 0 = not likely, 1 = slight chance, 2 = moderate chance, 3 = high chance   Sitting and Reading? Watching Television? Sitting inactive in a public place (theater or meeting)? As a passenger in a car for an hour without a break? Lying down in the afternoon when circumstances permit? Sitting and talking to someone? Sitting quietly after lunch without alcohol? In a car, while stopped for a few minutes in traffic?   Total = 20/ 24 points   FSS endorsed at 61/ 63 points. BOTH HIGH GDS; N/A   Social History   Socioeconomic History   Marital status: Married    Spouse name: [name]   Number of children: 2    Years of education: HS/ GED   Highest education level: Training at employer  Occupational History   Not on file  Tobacco Use   Smoking status: Former    Packs/day: 2.00    Years: 20.00    Total pack years: 40.00    Types: Cigarettes    Quit date: 01/04/2022    Years since quitting: 0.1- May/June 2023   Smokeless tobacco: Former  Scientific laboratory technician Use: Never used  Substance and Sexual Activity   Alcohol use: No   Drug use: No   Sexual activity: Yes  Other Topics Concern   Not on file  Social History Narrative   Patient lives at home with spouse.   Caffeine Use: 2 L daily   Social Determinants of Health   Financial Resource Strain: Not on file  Food Insecurity: Not on file  Transportation Needs: Not on file  Physical Activity: Not on file  Stress: Not on file  Social Connections: Not on file    Family History  Problem Relation Age of Onset   Diabetes Mother    Arthritis Mother    Cancer Father        lymphoma   Hyperlipidemia Father    Alcohol abuse Father    Arthritis Father    Depression Father    Diabetes Father    Stroke Father     Past Medical History:  Diagnosis Date   Anxiety    Hypersomnia, persistent    Insomnia with sleep apnea    Sleep apnea    Substance abuse ([place])    Tobacco use disorder     Past Surgical History:  Procedure Laterality Date   AMPUTATION Right 12/18/2018   Procedure: RIGHT DISTAL PHALANX INDEX FINGER AMPUTATION;  Surgeon: Leanora Cover, MD;  Location: [facility];  Service: Orthopedics;  Laterality: Right;   KNEE SURGERY Left      Current Outpatient Medications on File Prior to Visit  Medication Sig Dispense Refill   ARIPiprazole (ABILIFY) 10 MG tablet Take 1 tablet (10 mg total) by mouth daily. 90 tablet 1   ibuprofen (ADVIL) 200 MG tablet Take 200 mg by mouth every 6 (six) hours as needed.     naltrexone (DEPADE) 50 MG tablet Take 1 tablet (50 mg total) by mouth daily. 90 tablet 1   pregabalin (LYRICA)  150 MG capsule Take 1 capsule (150 mg total) by mouth in the morning, at noon, and at bedtime. 90 capsule 5   TRINTELLIX 20 MG TABS tablet TAKE 1 TABLET(20 MG) BY MOUTH DAILY 90 tablet 0   varenicline ([name]) 1 MG tablet Take 1 tablet (1 mg total) by mouth 2 (two) times daily. 168 tablet 1  No current facility-administered medications on file prior to visit.    No Known Allergies  Physical exam:  Today's Vitals   02/14/22 0841  BP: 132/76  Pulse: 68  Weight: 276 lb (125.2 kg)  Height: $Remove'6\' 1"'rtvdffW$  (1.854 m)   Body mass index is 36.41 kg/m.   Wt Readings from Last 3 Encounters:  02/14/22 276 lb (125.2 kg)  01/18/22 276 lb 9.6 oz (125.5 kg)  12/13/21 276 lb 12.8 oz (125.6 kg)     Ht Readings from Last 3 Encounters:  02/14/22 $RemoveB'6\' 1"'QwLnGJth$  (1.854 m)  04/20/21 $RemoveB'6\' 1"'rikYuply$  (1.854 m)  12/18/18 $RemoveB'6\' 1"'gXvHDbIR$  (1.854 m)      General: The patient is awake, alert and appears not in acute distress. The patient is well groomed. Head: Normocephalic, atraumatic.  Neck is supple. Mallampati 3,  neck circumference:19 inches . Hoarse voice. Nasal airflow patent.  Retrognathia is not seen.  Dental status: biological  Cardiovascular:  Regular rate and cardiac rhythm by pulse,  without distended neck veins. Respiratory: Lungs are clear to auscultation.  Skin:  With evidence of ankle edema, no rash. Sun burned Trunk: The patient's posture is erect.   Neurologic exam : The patient is awake and alert, oriented to place and time.   Memory subjective described as intact.  Attention span & concentration ability appears normal.  Speech is fluent,  with dysphonia , subjectively reporting aphasia.  Mood and affect are appropriate.   Cranial nerves: no loss of smell or taste reported  Pupils are equal and briskly reactive to light. Funduscopic exam deferred. .  Extraocular movements in vertical and horizontal planes were intact and without nystagmus. No Diplopia. Visual fields by finger perimetry are intact. Hearing  was intact to tuning fork. No tinnitus.  Facial sensation intact to fine touch.  Facial motor strength is symmetric and tongue and uvula move midline.  Neck ROM : rotation, tilt and flexion extension were normal for age and shoulder shrug was symmetrical.    Motor exam:  Symmetric bulk, tone and ROM.   Normal tone without cog wheeling, symmetric grip strength .   Sensory:  Fine touch,and vibration were slightly reduced at both ankles.  Proprioception tested in the upper extremities was normal.   Coordination: Rapid alternating movements in the fingers/hands were of normal speed.  The Finger-to-nose maneuver was intact without evidence of ataxia, dysmetria or tremor.   Gait and station: Patient could rise unassisted from a seated position, walked without assistive device.  Stance is of normal width/ base and the patient turned with 3 steps.  Toe and heel walk were deferred.  Deep tendon reflexes: in the  upper and lower extremities are symmetric and intact.  Status post arthroscopic knee surgery for meniscus tear right leg.  Babinski response was deferred.         After spending a total time of 45  minutes face to face and additional time for physical and neurologic examination, review of laboratory studies,  personal review of imaging studies, reports and results of other testing and review of referral information / records as far as provided in visit, I have established the following assessments:  TSH in normal range, tox screens negative for opiates, HBa1c at 6 in 2022, pre diabetic.  Now 5.7, still pre diabetic. Vit b 12 normal at 368.   Medication reviewed.   CT chest in 4/ 2020: 6 mm right upper lobe pulmonary nodule. Non-contrast chest CT at 6-12 months is recommended. If the nodule is stable  at time of repeat CT, then future CT at 18-24 months (from today's scan) is considered optional for low-risk patients, but is recommended for high-risk patients. 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 Long time tobacco user, 30 p years.  3)  vivid dreams , daytime naps and sleep attacks.    My Plan is to proceed with:  1) attended sleep study is recommended for Narcolepsy work up, this patient has frequent vivid dreams,  lucid dreams, sleep paralysis, EDS/ fatigue.   2) sleep apnea is likely present, given BMI, neck size, airway structure and witnessed apnoeic events. ( Wife is a Marine scientist )  3) prefer to order HLA, HST first today, and then follow with PSG with MSLT to follow if HLA is positive .   I would like to thank Billie Ruddy, MD and Billie Ruddy, Md [address] [place],  [place] [address] for allowing me to meet with and to take care of this pleasant patient.   In short, Murad Staples is presenting with excessive daytime sleepiness for many years, sleep paralysis, dream a enactment, vivid dreams, snoring and witnessed apnea-  symptom that can be attributed to Narcolepsy/ idiopathic hypersomnia , and OSA/ CSA.   I plan to follow up either personally or through our NP within 2 month.   CC: I will share my notes with PCP.  Electronically signed by: Larey Seat, MD 02/14/2022 9:23 AM  [facility] Board certified by The [employer] of Sleep Medicine and Diplomate of the [employer] of Sleep Medicine. Board certified In Neurology through the [place], Fellow of the [employer] of Neurology. Medical Director of [facility].

## 2022-02-19 ENCOUNTER — Ambulatory Visit (HOSPITAL_COMMUNITY): Payer: BC Managed Care – PPO | Admitting: Licensed Clinical Social Worker

## 2022-02-19 ENCOUNTER — Ambulatory Visit: Payer: BC Managed Care – PPO | Admitting: Family Medicine

## 2022-02-22 ENCOUNTER — Encounter: Payer: Self-pay | Admitting: Neurology

## 2022-02-22 LAB — NARCOLEPSY EVALUATION
DQA1*01:02: POSITIVE
DQB1*06:02: NEGATIVE

## 2022-02-26 ENCOUNTER — Ambulatory Visit (INDEPENDENT_AMBULATORY_CARE_PROVIDER_SITE_OTHER): Payer: BC Managed Care – PPO | Admitting: Licensed Clinical Social Worker

## 2022-02-26 ENCOUNTER — Encounter (HOSPITAL_COMMUNITY): Payer: Self-pay | Admitting: Licensed Clinical Social Worker

## 2022-02-26 ENCOUNTER — Ambulatory Visit: Payer: BC Managed Care – PPO | Admitting: Family Medicine

## 2022-02-26 DIAGNOSIS — F419 Anxiety disorder, unspecified: Secondary | ICD-10-CM | POA: Diagnosis not present

## 2022-02-26 DIAGNOSIS — F1121 Opioid dependence, in remission: Secondary | ICD-10-CM

## 2022-02-26 DIAGNOSIS — F32A Depression, unspecified: Secondary | ICD-10-CM

## 2022-02-26 NOTE — Progress Notes (Signed)
  Virtual Visit via Video Note  I connected with Kevin Andrade on 02/26/2022 at 9:15am by a Video-enabled telemedicine application and verified that I am speaking with the correct person using two identifiers.   I discussed the limitations of evaluation and management by telemedicine and the availability of in person appointments. The patient expressed understanding and agreed to proceed.  LOCATION: Patient: Home Provider: Home Office  Therapy Goal addressed:  Pt will learn the importance of NA meetings in order to maintain sobriety AEB by 3 meeting attendance per week, working with sponsor 1x per week and completing step work.   History of Present Illness: Patient is referred for individual therapy after completion of CD-IOP for Opiod Use Disorder and hx of polysubstance abuse disorder, anxiety, depression. (Kratom & opiate pills)    Progress Toward Goal: Progressing    Observations/Objective: Patient presented for today's session on time and was alert, oriented x5, with no evidence or self-report of SI/HI or A/V H.  Patient reported ongoing compliance with medication and denied any use of alcohol or illicit substances.  Clinician inquired about patient's current emotional ratings, as well as any significant changes in thoughts, feelings or behavior since previous session.  Patient reported emotional ratings of 6/10 for depression, 6/10 for anxiety, 2/10 for anger/irritability. Cln and pt explored his emotional ratings and coping skills. Clinician utilized CBT to process concerns and work challenges. Clinician processed thoughts, feelings, and behaviors. Clinician discussed coping skills and options for supporting himself through these challenging times. Clinician utilized CBT to process challenges. Clinician processed thoughts, feelings, and behaviors. Clinician discussed coping skills and options for supporting himself through the challenging times. Cln utilized CBT to help process  updates: on family, living situation, work, health, being smoke free, stress level, grandchildren. Cln processed with patient changes over the previous week to support sobriety and build his recovery network. Patient went to 2 meetings this past weekend and talked to his sponsor.  "I'm still working on "letting go" and turning everything over, step 5." Cln reminded patient of the importance of continued sobriety is NA, Marketing executive and Step Work.  Again, Cln suggested pt buy the NA workbook to assist in his step work.      Collaboration of Care: Other: NA meetings and weekly sponsor meetings    Patient/Guardian was advised Release of Information must be obtained prior to any record release in order to collaborate their care with an outside provider. Patient/Guardian was advised if they have not already done so to contact the registration department to sign all necessary forms in order for [place] to release information regarding their care.   Consent: Patient/Guardian gives verbal consent for treatment and assignment of benefits for services provided during this visit. Patient/Guardian expressed understanding and agreed to proceed.        Follow Up Instructions: I discussed the assessment and treatment plan with the patient. The patient was provided an opportunity to ask questions and all were answered. The patient agreed with the plan and demonstrated an understanding of the instructions.   The patient was advised to call back or seek an in-person evaluation if the symptoms worsen or if the condition fails to improve as anticipated.  I provided 45 minutes of non-face-to-face time during this encounter.   Gretel Cantu S, LCAS

## 2022-02-28 ENCOUNTER — Telehealth: Payer: Self-pay | Admitting: Neurology

## 2022-02-28 ENCOUNTER — Ambulatory Visit (INDEPENDENT_AMBULATORY_CARE_PROVIDER_SITE_OTHER): Payer: BC Managed Care – PPO | Admitting: Family Medicine

## 2022-02-28 ENCOUNTER — Encounter: Payer: Self-pay | Admitting: Family Medicine

## 2022-02-28 VITALS — BP 124/96 | HR 68 | Temp 98.3°F | Wt 275.4 lb

## 2022-02-28 DIAGNOSIS — F17210 Nicotine dependence, cigarettes, uncomplicated: Secondary | ICD-10-CM

## 2022-02-28 DIAGNOSIS — F321 Major depressive disorder, single episode, moderate: Secondary | ICD-10-CM | POA: Diagnosis not present

## 2022-02-28 DIAGNOSIS — Z1211 Encounter for screening for malignant neoplasm of colon: Secondary | ICD-10-CM

## 2022-02-28 DIAGNOSIS — F411 Generalized anxiety disorder: Secondary | ICD-10-CM | POA: Diagnosis not present

## 2022-02-28 MED ORDER — VARENICLINE TARTRATE 1 MG PO TABS: 1.0000 mg | ORAL_TABLET | Freq: Two times a day (BID) | ORAL | 1 refills | Status: DC

## 2022-02-28 NOTE — Telephone Encounter (Signed)
HST- [employer] [place]: [id number] (exp. 02/20/22 to 04/20/22) patient is scheduled at [facility] for 03/27/22 at 9AM. I mailed packet out as well.

## 2022-02-28 NOTE — Patient Instructions (Signed)
Do not forget to contact the sleep medicine doctor in regards to scheduling sleep study.

## 2022-02-28 NOTE — Progress Notes (Signed)
Subjective:    Patient ID: Kevin Andrade, male    DOB: [birth year], 51 y.o.   MRN: [id number]  Chief Complaint  Patient presents with   Follow-up    On chantix, anxiety and depression    HPI Patient was seen today for f/u.  Pt states he has been doing well.  Quit smoking 1 month ago while on [name].  Currently taking [name] 1 mg twice daily.  Has the occasional desire to smoke but has been making changes to routine and finding other things to do when he has the urge.  Patient initially had vivid dreams but is no longer having them while on the medication.  Patient continues counseling weekly to help with anxiety and depression.  States overall mood is good.  Just returned from vacation in [place].  Waiting to hear back about scheduling sleep study for history of daytime somnolence, had consultation appointment 2 weeks ago.  Patient inquires about having colonoscopy done with Dr. Rhea Belton as his wife sees him.  Past Medical History:  Diagnosis Date   Anxiety    Hypersomnia, persistent    Insomnia with sleep apnea    Sleep apnea    Substance abuse ([name])    Tobacco use disorder     No Known Allergies  ROS General: Denies fever, chills, night sweats, changes in weight, changes in appetite HEENT: Denies headaches, ear pain, changes in vision, rhinorrhea, sore throat CV: Denies CP, palpitations, SOB, orthopnea Pulm: Denies SOB, cough, wheezing GI: Denies abdominal pain, nausea, vomiting, diarrhea, constipation GU: Denies dysuria, hematuria, frequency Msk: Denies muscle cramps, joint pains Neuro: Denies weakness, numbness, tingling Skin: Denies rashes, bruising Psych: Denies depression, anxiety, hallucinations + anxiety, depression     Objective:    Blood pressure 140/90, pulse 68, temperature 98.3 F (36.8 C), temperature source Oral, resp. rate (!) 96, weight 275 lb 6.4 oz (124.9 kg).  Gen. Pleasant, well-nourished, in no distress, normal affect   HEENT: [place]/AT,  face symmetric, conjunctiva clear, no scleral icterus, PERRLA, EOMI, nares patent without drainage, pharynx without erythema or exudate. Neck: No JVD, no thyromegaly, no carotid bruits Lungs: no accessory muscle use, CTAB, no wheezes or rales Cardiovascular: RRR, no m/r/g, no peripheral edema Musculoskeletal: No deformities, no cyanosis or clubbing, normal tone Neuro:  A&Ox3, CN II-XII intact, normal gait Skin:  Warm, no lesions/ rash.  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   Wt Readings from Last 3 Encounters:  02/28/22 275 lb 6.4 oz (124.9 kg)  02/14/22 276 lb (125.2 kg)  01/18/22 276 lb 9.6 oz (125.5 kg)    Lab Results  Component Value Date   WBC 7.6 04/20/2021   HGB 14.8 04/20/2021   HCT 43.4 04/20/2021   PLT 239.0 04/20/2021   GLUCOSE 55 (L) 04/20/2021   CHOL 181 04/20/2021   TRIG 119.0 04/20/2021   HDL 52.40 04/20/2021   LDLCALC 105 (H) 04/20/2021   ALT 24 04/20/2021   AST 19 04/20/2021   NA 142 04/20/2021   K 4.3 04/20/2021   CL 105 04/20/2021   CREATININE 1.32 04/20/2021   BUN 14 04/20/2021   CO2 28 04/20/2021   TSH 1.44 04/20/2021   HGBA1C 5.7 (A) 12/13/2021      02/28/2022    9:33 AM 01/18/2022    8:33 AM 04/20/2021    8:12 AM  Depression screen PHQ 2/9  Decreased Interest 2 2 2   Down, Depressed, Hopeless 1 2 2   PHQ - 2 Score 3 4  4  Altered sleeping 2 3 2   Tired, decreased energy 2 3 3   Change in appetite 2 2 3   Feeling bad or failure about yourself  1 2 2   Trouble concentrating 1 1 3   Moving slowly or fidgety/restless 0 1 1  Suicidal thoughts 0 1 1  PHQ-9 Score 11 17 19   Difficult doing work/chores Somewhat difficult Very difficult Very difficult      02/28/2022    9:35 AM 04/20/2021    8:13 AM 02/13/2021    9:48 AM 06/01/2019    1:13 PM  GAD 7 : Generalized Anxiety Score  Nervous, Anxious, on Edge 2 2    Control/stop worrying 2 3    Worry too much - different things 2 3    Trouble relaxing 2 2    Restless 1 1    Easily annoyed or irritable  3 3    Afraid - awful might happen 1 2    Total GAD 7 Score 13 16    Anxiety Difficulty Somewhat difficult Very difficult       Information is confidential and restricted. Go to Review Flowsheets to unlock data.    Assessment/Plan:  Cigarette nicotine dependence without complication -Smoking cessation counseling greater than 3 minutes, less than 10 minutes -Nicotine free x1 month.  Patient congratulated. -continued smoking cessation encouraged -Continue [name] 1 mg twice daily -Given handout  - Plan: varenicline ([name]) 1 MG tablet  GAD (generalized anxiety disorder) -Improvement -GAD-7 score 13 this visit.  Previously 16 -Continue current medications -Continue counseling/follow-up with psychiatry -Given precautions  Depression, major, single episode, moderate ([name]) -Improving -PHQ-9 score 11 this visit.  Previously 17 -Continue current medications including Abilify 10 mg, Trintellix 20 mg, Lyrica 150 mg 3 times daily -Continue counseling with psychiatry  Colon cancer screening  - Plan: Ambulatory referral to Gastroenterology  F/u 2-3 months  , MD

## 2022-03-05 ENCOUNTER — Encounter (HOSPITAL_COMMUNITY): Payer: Self-pay | Admitting: Licensed Clinical Social Worker

## 2022-03-05 ENCOUNTER — Ambulatory Visit (INDEPENDENT_AMBULATORY_CARE_PROVIDER_SITE_OTHER): Payer: BC Managed Care – PPO | Admitting: Licensed Clinical Social Worker

## 2022-03-05 DIAGNOSIS — F1121 Opioid dependence, in remission: Secondary | ICD-10-CM

## 2022-03-05 DIAGNOSIS — F32A Depression, unspecified: Secondary | ICD-10-CM | POA: Diagnosis not present

## 2022-03-05 DIAGNOSIS — F419 Anxiety disorder, unspecified: Secondary | ICD-10-CM | POA: Diagnosis not present

## 2022-03-05 NOTE — Progress Notes (Signed)
Virtual Visit via Video Note  I connected with Kevin Andrade on 03/05/2022 at 9:00 am by a Video-enabled telemedicine application and verified that I am speaking with the correct person using two identifiers.   I discussed the limitations of evaluation and management by telemedicine and the availability of in person appointments. The patient expressed understanding and agreed to proceed.  LOCATION: Patient: Home Provider: Home Office  Therapy Goal addressed:  Pt will learn the importance of NA meetings in order to maintain sobriety AEB by 3 meeting attendance per week, working with sponsor 1x per week and completing step work.   History of Present Illness: Patient is referred for individual therapy after completion of CD-IOP for Opiod Use Disorder and hx of polysubstance abuse disorder, anxiety, depression. (Kratom & opiate pills)    Progress Toward Goal: Progressing    Observations/Objective: Patient presented for today's session on time and was alert, oriented x5, with no evidence or self-report of SI/HI or A/V H.  Patient reported ongoing compliance with medication and denied any use of alcohol or illicit substances.  Clinician inquired about patient's current emotional ratings, as well as any significant changes in thoughts, feelings or behavior since previous session.  Patient reported emotional ratings of 6/10 for depression, 6/10 for anxiety, 2/10 for anger/irritability. Cln and pt explored his emotional ratings and coping skills. Clinician utilized CBT to process concerns and work challenges. Clinician processed thoughts, feelings, and behaviors. Clinician discussed coping skills and options for supporting himself through these challenging times. Clinician utilized CBT to process challenges. Clinician processed thoughts, feelings, and behaviors. Clinician discussed coping skills and options for supporting himself through the challenging times. Cln utilized CBT to help process  updates: on family, living situation, work, health, being smoke free, stress level, grandchildren. Pt reports, "I'll pick up my sleep study equipment on 8/1. I hope the results will help me in getting more rest so I won't be tired all the time. Cln processed with patient changes over the previous week to support sobriety and build his recovery network. Patient went to 2 meetings this past weekend and did not meet with his sponsor who was on vacation.  "I'm now working on step 6, character defects. I bought the NA workbook." Cln and pt reviewed questions in workbook on step 6, where pt identified his top 2 character defects. Cln reminded patient of the importance of continued sobriety is NA, Marketing executive and Step Work. Pt had to end session early due to an interview for a new job at work.    Collaboration of Care: Other: NA meetings and weekly sponsor meetings    Patient/Guardian was advised Release of Information must be obtained prior to any record release in order to collaborate their care with an outside provider. Patient/Guardian was advised if they have not already done so to contact the registration department to sign all necessary forms in order for [place] to release information regarding their care.   Consent: Patient/Guardian gives verbal consent for treatment and assignment of benefits for services provided during this visit. Patient/Guardian expressed understanding and agreed to proceed.        Follow Up Instructions: I discussed the assessment and treatment plan with the patient. The patient was provided an opportunity to ask questions and all were answered. The patient agreed with the plan and demonstrated an understanding of the instructions.   The patient was advised to call back or seek an in-person evaluation if the symptoms worsen or if the condition fails to  improve as anticipated.  I provided 30 minutes of non-face-to-face time during this encounter.   Corliss Coggeshall S, LCAS

## 2022-03-12 ENCOUNTER — Ambulatory Visit (INDEPENDENT_AMBULATORY_CARE_PROVIDER_SITE_OTHER): Payer: BC Managed Care – PPO | Admitting: Licensed Clinical Social Worker

## 2022-03-12 ENCOUNTER — Encounter (HOSPITAL_COMMUNITY): Payer: Self-pay | Admitting: Licensed Clinical Social Worker

## 2022-03-12 DIAGNOSIS — F419 Anxiety disorder, unspecified: Secondary | ICD-10-CM | POA: Diagnosis not present

## 2022-03-12 DIAGNOSIS — F1121 Opioid dependence, in remission: Secondary | ICD-10-CM | POA: Diagnosis not present

## 2022-03-12 DIAGNOSIS — F32A Depression, unspecified: Secondary | ICD-10-CM

## 2022-03-12 NOTE — Progress Notes (Signed)
  Virtual Visit via Video Note  I connected with Kevin Andrade on 03/12/2022 at 9:00 am by a Video-enabled telemedicine application and verified that I am speaking with the correct person using two identifiers.   I discussed the limitations of evaluation and management by telemedicine and the availability of in person appointments. The patient expressed understanding and agreed to proceed.  LOCATION: Patient: Home Provider: Home Office  Therapy Goal addressed:  Pt will learn the importance of NA meetings in order to maintain sobriety AEB by 3 meeting attendance per week, working with sponsor 1x per week and completing step work.   History of Present Illness: Patient is referred for individual therapy after completion of CD-IOP for Opiod Use Disorder and hx of polysubstance abuse disorder, anxiety, depression. (Kratom & opiate pills)    Progress Toward Goal: Progressing    Observations/Objective: Patient presented for today's session on time and was alert, oriented x5, with no evidence or self-report of SI/HI or A/V H.  Patient reported ongoing compliance with medication and denied any use of alcohol or illicit substances.  Clinician inquired about patient's current emotional ratings, as well as any significant changes in thoughts, feelings or behavior since previous session.  Patient reported emotional ratings of 6/10 for depression, 6/10 for anxiety, 2/10 for anger/irritability. Cln and pt explored his emotional ratings and coping skills. Clinician utilized CBT to process concerns and work challenges. Clinician processed thoughts, feelings, and behaviors. Clinician discussed coping skills and options for supporting himself through these challenging times. Clinician utilized CBT to process challenges. Clinician processed thoughts, feelings, and behaviors. Clinician discussed coping skills and options for supporting himself through the challenging times. Cln utilized CBT to help process  updates: on family, living situation, work, health, being smoke free, stress level, grandchildren, upcoming sleep study. Cln processed with patient changes over the previous week to support sobriety and build his recovery network. Patient went to 2 meetings this past weekend and met with his sponsor to discuss character defects.  "I'm now working on step 6, character defects and filling out the NA workbook." Cln and pt reviewed questions in workbook on step 6, where pt identified his top 2 character defects. Cln reminded patient of the importance of continued sobriety is NA, Publishing copy and Step Work. Marland Kitchen    Collaboration of Care: Other: NA meetings and weekly sponsor meetings    Patient/Guardian was advised Release of Information must be obtained prior to any record release in order to collaborate their care with an outside provider. Patient/Guardian was advised if they have not already done so to contact the registration department to sign all necessary forms in order for [place] to release information regarding their care.   Consent: Patient/Guardian gives verbal consent for treatment and assignment of benefits for services provided during this visit. Patient/Guardian expressed understanding and agreed to proceed.        Follow Up Instructions: I discussed the assessment and treatment plan with the patient. The patient was provided an opportunity to ask questions and all were answered. The patient agreed with the plan and demonstrated an understanding of the instructions.   The patient was advised to call back or seek an in-person evaluation if the symptoms worsen or if the condition fails to improve as anticipated.  I provided 45 minutes of non-face-to-face time during this encounter.   Loda Bialas S, LCAS

## 2022-03-19 ENCOUNTER — Encounter (HOSPITAL_COMMUNITY): Payer: Self-pay | Admitting: Licensed Clinical Social Worker

## 2022-03-19 ENCOUNTER — Ambulatory Visit (INDEPENDENT_AMBULATORY_CARE_PROVIDER_SITE_OTHER): Payer: BC Managed Care – PPO | Admitting: Licensed Clinical Social Worker

## 2022-03-19 DIAGNOSIS — F1121 Opioid dependence, in remission: Secondary | ICD-10-CM | POA: Diagnosis not present

## 2022-03-19 DIAGNOSIS — F32A Depression, unspecified: Secondary | ICD-10-CM | POA: Diagnosis not present

## 2022-03-19 DIAGNOSIS — F419 Anxiety disorder, unspecified: Secondary | ICD-10-CM | POA: Diagnosis not present

## 2022-03-19 NOTE — Progress Notes (Signed)
  Virtual Visit via Video Note  I connected with Kevin Andrade on 03/19/2022 at 9:00 am by a Video-enabled telemedicine application and verified that I am speaking with the correct person using two identifiers.   I discussed the limitations of evaluation and management by telemedicine and the availability of in person appointments. The patient expressed understanding and agreed to proceed.  LOCATION: Patient: Home Provider: Home Office  Therapy Goal addressed:  Pt will learn the importance of NA meetings in order to maintain sobriety AEB by 3 meeting attendance per week, working with sponsor 1x per week and completing step work.   History of Present Illness: Patient is referred for individual therapy after completion of CD-IOP for Opiod Use Disorder and hx of polysubstance abuse disorder, anxiety, depression. (Kratom & opiate pills)    Progress Toward Goal: Progressing    Observations/Objective: Patient presented for today's session on time and was alert, oriented x5, with no evidence or self-report of SI/HI or A/V H.  Patient reported ongoing compliance with medication and denied any use of alcohol or illicit substances.  Clinician inquired about patient's current emotional ratings, as well as any significant changes in thoughts, feelings or behavior since previous session.  Patient reported emotional ratings of 6/10 for depression, 6/10 for anxiety, 2/10 for anger/irritability. Cln and pt explored his emotional ratings and coping skills. Clinician utilized CBT to process concerns and work challenges. Clinician processed thoughts, feelings, and behaviors. Clinician discussed coping skills and options for supporting himself through these challenging times. Cln utilized CBT to help process updates: family, living situation, work, health, being smoke free, stress level, grandchildren, upcoming sleep study. "We decided to sell our house and live with my wife's sister." Cln asked open-ended  questions. Cln used CBT to process his concerns about selling his home. Cln processed with patient changes over the previous week to support sobriety and build his recovery network. Patient went to 2 meetings this past weekend and met with his sponsor to discuss character defects.  "I'm now working on step 6, character defects and filling out the NA workbook." Cln and pt reviewed questions in workbook on step 6, where pt identified his top 2 character defects. Cln reminded patient of the importance of continued sobriety is NA, Publishing copy and Step Work. Marland Kitchen    Collaboration of Care: Other: NA meetings and weekly sponsor meetings    Patient/Guardian was advised Release of Information must be obtained prior to any record release in order to collaborate their care with an outside provider. Patient/Guardian was advised if they have not already done so to contact the registration department to sign all necessary forms in order for [place] to release information regarding their care.   Consent: Patient/Guardian gives verbal consent for treatment and assignment of benefits for services provided during this visit. Patient/Guardian expressed understanding and agreed to proceed.        Follow Up Instructions: I discussed the assessment and treatment plan with the patient. The patient was provided an opportunity to ask questions and all were answered. The patient agreed with the plan and demonstrated an understanding of the instructions.   The patient was advised to call back or seek an in-person evaluation if the symptoms worsen or if the condition fails to improve as anticipated.  I provided 60 minutes of non-face-to-face time during this encounter.   Itha Kroeker S, LCAS

## 2022-03-26 ENCOUNTER — Ambulatory Visit (HOSPITAL_COMMUNITY): Payer: BC Managed Care – PPO | Admitting: Licensed Clinical Social Worker

## 2022-03-27 ENCOUNTER — Ambulatory Visit: Payer: BC Managed Care – PPO | Admitting: Neurology

## 2022-03-27 DIAGNOSIS — G4710 Hypersomnia, unspecified: Secondary | ICD-10-CM | POA: Diagnosis not present

## 2022-03-27 DIAGNOSIS — G4719 Other hypersomnia: Secondary | ICD-10-CM

## 2022-03-27 DIAGNOSIS — R0683 Snoring: Secondary | ICD-10-CM

## 2022-03-28 ENCOUNTER — Telehealth: Payer: Self-pay | Admitting: Neurology

## 2022-03-28 NOTE — Progress Notes (Signed)
IMPRESSION:  This HST confirms the presence of severe snoring and the combination with mild to moderate sleep apnea.  This sleep apnea was strictly REM sleep dependent and not positional dependent.  Based on this distribution positive airway pressure therapy is really the treatment with most efficacy for REM dependent sleep apnea. I would like to add that he slept for 9 hours ,had an excellent sleep continuity with high amount of REM sleep and very few arousals.  RECOMMENDATION: I would recommend an auto titration device with a setting between 6 and 16 cmH2O pressure 3 cm EPR, heated humidification and interface of patient's choice.  I prefer a ResMed or [place] device. If hypersomnia persists after 60-90 days of CPAP treatment, I will initiate a narcolepsy/ hypersomnia work up.

## 2022-03-28 NOTE — Telephone Encounter (Signed)
-----   Message from Melvyn Novas, MD sent at 03/28/2022 12:55 PM EDT ----- IMPRESSION:  This HST confirms the presence of severe snoring and the combination with mild to moderate sleep apnea.  This sleep apnea was strictly REM sleep dependent and not positional dependent.  Based on this distribution positive airway pressure therapy is really the treatment with most efficacy for REM dependent sleep apnea. I would like to add that he slept for 9 hours ,had an excellent sleep continuity with high amount of REM sleep and very few arousals.  RECOMMENDATION: I would recommend an auto titration device with a setting between 6 and 16 cmH2O pressure 3 cm EPR, heated humidification and interface of patient's choice.  I prefer a ResMed or [place] device. If hypersomnia persists after 60-90 days of CPAP treatment, I will initiate a narcolepsy/ hypersomnia work up.

## 2022-03-28 NOTE — Progress Notes (Signed)
[facility] [employer] [facility]     HOME SLEEP TEST REPORT ( by Watch PAT)   STUDY DATE:  03-28-2022   ORDERING CLINICIAN: Melvyn Novas, MD  REFERRING CLINICIAN:    CLINICAL INFORMATION/HISTORY: I have the pleasure of seeing Kevin Andrade on 02-14-2022, a right -handed  Caucasian male with a possible sleep disorder.  He has a past medical history of Anxiety, Hypersomnia, persistent, Insomnia with witnessed sleep apnea, suspected Obstructive Sleep apnea, remote history of Substance abuse ([name]), and hx of Tobacco use disorder until May 2023. His hypersomnia was reason for sleep consult in 2014, requested by Dr Merla Riches, when he was working out of town and irregular hours, this has changed. No trouble with sleep initiation insomnia.    Sleep relevant medical history: Nocturia, 2-3 times, no deviated septum repair, thyroid disease, goiter.  He gained weight over the last 10 years, and reached a BMI of 36.    Epworth sleepiness score: 20//24.   BMI: 36.5 kg/m   Neck Circumference: 19"   FINDINGS:   Sleep Summary:   Total Recording Time (hours, min):   9 hours and 57 minutes,  Total Sleep Time (hours, min):    9 hours and 21 minutes           Percent REM (%):   29.1%                                     Respiratory Indices:   Calculated pAHI (per hour):   15.5/h                          REM pAHI:   34/h                                              NREM pAHI:    7.8/h                          Positional AHI: For the majority of the night this patient slept in supine sleep about 240 minutes with an AHI of 16.3.  This was followed by 190 minutes in the right lateral position with an AHI of 16.8 and by 113 minutes in the left sleep position with an AHI of 11.9.  There was no significant difference between different sleep positions.  Snoring volume /mean level was 52 dB. This is very loud snoring and it accompanied the whole night.                                                  Oxygen Saturation Statistics:     O2 Saturation Range (%): Between a nadir of 85% and a maximum of 98% with a mean saturation of 94%.                                     O2 Saturation (minutes) <89%: 4.9 minutes         Pulse  Rate Statistics:   Pulse Mean (bpm):   58 bpm              Pulse Range: Between 45 and 95 bpm.               IMPRESSION:  This HST confirms the presence of severe snoring and the combination with mild to moderate sleep apnea.  This sleep apnea was strictly REM sleep dependent and not positional dependent.  Based on this distribution positive airway pressure therapy is really the treatment with most efficacy for REM dependent sleep apnea. I would like to add that he slept for 9 hours ,had an excellent sleep continuity with high amount of REM sleep and very few arousals.   RECOMMENDATION: I would recommend an auto titration device with a setting between 6 and 16 cmH2O pressure 3 cm EPR, heated humidification and interface of patient's choice.  I prefer a ResMed or [place] device. If hypersomnia persists after 60-90 days of CPAP treatment, I will initiate a narcolepsy/ hypersomnia work up.    INTERPRETING PHYSICIAN:  Melvyn Novas, MD  Medical Director of [facility] at [facility].

## 2022-03-28 NOTE — Procedures (Signed)
         [facility]     HOME SLEEP TEST REPORT ( by Watch PAT)   STUDY DATE:  03-28-2022   ORDERING CLINICIAN: Ionia Schey, MD  REFERRING CLINICIAN:    CLINICAL INFORMATION/HISTORY: I have the pleasure of seeing Kevin Andrade on 02-14-2022, a right -handed  Caucasian male with a possible sleep disorder.  He has a past medical history of Anxiety, Hypersomnia, persistent, Insomnia with witnessed sleep apnea, suspected Obstructive Sleep apnea, remote history of Substance abuse ([name]), and hx of Tobacco use disorder until May 2023. His hypersomnia was reason for sleep consult in 2014, requested by Dr Doolittle, when he was working out of town and irregular hours, this has changed. No trouble with sleep initiation insomnia.    Sleep relevant medical history: Nocturia, 2-3 times, no deviated septum repair, thyroid disease, goiter.  He gained weight over the last 10 years, and reached a BMI of 36.    Epworth sleepiness score: 20//24.   BMI: 36.5 kg/m   Neck Circumference: 19"   FINDINGS:   Sleep Summary:   Total Recording Time (hours, min):   9 hours and 57 minutes,  Total Sleep Time (hours, min):    9 hours and 21 minutes           Percent REM (%):   29.1%                                     Respiratory Indices:   Calculated pAHI (per hour):   15.5/h                          REM pAHI:   34/h                                              NREM pAHI:    7.8/h                          Positional AHI: For the majority of the night this patient slept in supine sleep about 240 minutes with an AHI of 16.3.  This was followed by 190 minutes in the right lateral position with an AHI of 16.8 and by 113 minutes in the left sleep position with an AHI of 11.9.  There was no significant difference between different sleep positions.  Snoring volume /mean level was 52 dB. This is very loud snoring and it accompanied the whole night.                                                  Oxygen Saturation Statistics:     O2 Saturation Range (%): Between a nadir of 85% and a maximum of 98% with a mean saturation of 94%.                                     O2 Saturation (minutes) <89%: 4.9 minutes         Pulse   Rate Statistics:   Pulse Mean (bpm):   58 bpm              Pulse Range: Between 45 and 95 bpm.               IMPRESSION:  This HST confirms the presence of severe snoring and the combination with mild to moderate sleep apnea.  This sleep apnea was strictly REM sleep dependent and not positional dependent.  Based on this distribution positive airway pressure therapy is really the treatment with most efficacy for REM dependent sleep apnea. I would like to add that he slept for 9 hours ,had an excellent sleep continuity with high amount of REM sleep and very few arousals.   RECOMMENDATION: I would recommend an auto titration device with a setting between 6 and 16 cmH2O pressure 3 cm EPR, heated humidification and interface of patient's choice.  I prefer a ResMed or Phillips device. If hypersomnia persists after 60-90 days of CPAP treatment, I will initiate a narcolepsy/ hypersomnia work up.    INTERPRETING PHYSICIAN:  Winfrey Chillemi, MD  Medical Director of [facility].                

## 2022-03-28 NOTE — Telephone Encounter (Signed)
I called pt. I advised pt that Dr. Vickey Huger reviewed their sleep study results and found that pt has mild to moderate sleep apnea. Dr. Vickey Huger recommends that pt start auto CPAP. I reviewed PAP compliance expectations with the pt. Pt is agreeable to starting a CPAP. I advised pt that an order will be sent to a DME, [facility], and [facility] will call the pt within about one week after they file with the pt's insurance. [facility] will show the pt how to use the machine, fit for masks, and troubleshoot the CPAP if needed. A follow up appt was made for insurance purposes with Dr. Vickey Huger on 06/11/2022 at 8:30 am. Pt verbalized understanding to arrive 15 minutes early and bring their CPAP. A letter with all of this information in it will be mailed to the pt as a reminder. I verified with the pt that the address we have on file is correct. Pt verbalized understanding of results. Pt had no questions at this time but was encouraged to call back if questions arise. I have sent the order to [facility] and have received confirmation that they have received the order.

## 2022-03-28 NOTE — Addendum Note (Signed)
Addended by: Melvyn Novas on: 03/28/2022 12:55 PM   Modules accepted: Orders

## 2022-04-02 ENCOUNTER — Ambulatory Visit (HOSPITAL_COMMUNITY): Payer: BC Managed Care – PPO | Admitting: Licensed Clinical Social Worker

## 2022-04-02 ENCOUNTER — Telehealth (HOSPITAL_COMMUNITY): Payer: Self-pay | Admitting: Licensed Clinical Social Worker

## 2022-04-02 NOTE — Telephone Encounter (Signed)
Pt did not present for his virtual therapy appt. Cln called and text pt to join session. He did not join. Ottis Stain, MS, LCAS

## 2022-04-05 ENCOUNTER — Other Ambulatory Visit: Payer: Self-pay | Admitting: *Deleted

## 2022-04-05 NOTE — Patient Outreach (Signed)
  Care Coordination   04/05/2022 Name: Kevin Andrade MRN: [id number] DOB: [birth year]   Care Coordination Outreach Attempts:  An unsuccessful telephone outreach was attempted today to offer the patient information about available care coordination services as a benefit of their health plan.   Follow Up Plan:  Additional outreach attempts will be made to offer the patient care coordination information and services.   Encounter Outcome:  No Answer  Care Coordination Interventions Activated:  No   Care Coordination Interventions:  No, not indicated    Elliot Cousin, RN Care Management Coordinator [facility] Main Office [phone]

## 2022-04-06 ENCOUNTER — Other Ambulatory Visit (HOSPITAL_COMMUNITY): Payer: Self-pay | Admitting: Medical

## 2022-04-06 ENCOUNTER — Other Ambulatory Visit: Payer: Self-pay | Admitting: Family Medicine

## 2022-04-06 DIAGNOSIS — F17210 Nicotine dependence, cigarettes, uncomplicated: Secondary | ICD-10-CM

## 2022-04-09 ENCOUNTER — Encounter (HOSPITAL_COMMUNITY): Payer: Self-pay | Admitting: Licensed Clinical Social Worker

## 2022-04-09 ENCOUNTER — Ambulatory Visit (INDEPENDENT_AMBULATORY_CARE_PROVIDER_SITE_OTHER): Payer: BC Managed Care – PPO | Admitting: Licensed Clinical Social Worker

## 2022-04-09 DIAGNOSIS — F1121 Opioid dependence, in remission: Secondary | ICD-10-CM

## 2022-04-09 DIAGNOSIS — F419 Anxiety disorder, unspecified: Secondary | ICD-10-CM

## 2022-04-09 DIAGNOSIS — F32A Depression, unspecified: Secondary | ICD-10-CM

## 2022-04-09 NOTE — Progress Notes (Signed)
  Virtual Visit via Video Note  I connected with Kevin Andrade on 04/09/2022 at 9:00 am by a Video-enabled telemedicine application and verified that I am speaking with the correct person using two identifiers.   I discussed the limitations of evaluation and management by telemedicine and the availability of in person appointments. The patient expressed understanding and agreed to proceed.  LOCATION: Patient: Home Provider: Home Office  Therapy Goal addressed:  Pt will learn the importance of NA meetings in order to maintain sobriety AEB by 3 meeting attendance per week, working with sponsor 1x per week and completing step work.   History of Present Illness: Patient is referred for individual therapy after completion of CD-IOP for Opiod Use Disorder and hx of polysubstance abuse disorder, anxiety, depression. (Kratom & opiate pills)    Progress Toward Goal: Progressing    Observations/Objective: Patient presented for today's session on time and was alert, oriented x5, with no evidence or self-report of SI/HI or A/V H.  Patient reported ongoing compliance with medication and denied any use of alcohol or illicit substances.  Clinician inquired about patient's current emotional ratings, as well as any significant changes in thoughts, feelings or behavior since previous session.  Patient reported emotional ratings of 6/10 for depression, 6/10 for anxiety, 2/10 for anger/irritability. Cln and pt explored his emotional ratings and coping skills. Clinician utilized CBT to process concerns and work challenges. Clinician processed thoughts, feelings, and be and options for supporting himself. Cln utilized CBT to help process updates: family, living situation, work, health, being smoke free, stress level, grandchildren, cpap machine.Marland Kitchen "We are   all back in our houses temporarily deciding where to live. It's very stressful for me. I still have craving everyday to use." Cln asked open-ended questions.  Cln used CBT to process his concerns.. Cln processed with patient changes over the previous week to support sobriety and build his recovery network. Patient went to 2 meetings this past weekend and met with his sponsor to discuss step 6..  "I'm continuing to work on step 6, Cln and pt reviewed questions in workbook on step 6, where pt identified his top 2 character defects. Cln reminded patient of the importance of continued sobriety is NA, Publishing copy and Step Work. .Cln encouraged pt to talk to his sponsor about his continued cravings.    Collaboration of Care: Other: NA meetings and weekly sponsor meetings    Patient/Guardian was advised Release of Information must be obtained prior to any record release in order to collaborate their care with an outside provider. Patient/Guardian was advised if they have not already done so to contact the registration department to sign all necessary forms in order for [place] to release information regarding their care.   Consent: Patient/Guardian gives verbal consent for treatment and assignment of benefits for services provided during this visit. Patient/Guardian expressed understanding and agreed to proceed.        Follow Up Instructions: I discussed the assessment and treatment plan with the patient. The patient was provided an opportunity to ask questions and all were answered. The patient agreed with the plan and demonstrated an understanding of the instructions.   The patient was advised to call back or seek an in-person evaluation if the symptoms worsen or if the condition fails to improve as anticipated.  I provided 45 minutes of non-face-to-face time during this encounter.   Ayub Kirsh S, LCAS

## 2022-04-12 ENCOUNTER — Telehealth (HOSPITAL_BASED_OUTPATIENT_CLINIC_OR_DEPARTMENT_OTHER): Payer: Self-pay | Admitting: Medical

## 2022-04-12 ENCOUNTER — Encounter (HOSPITAL_COMMUNITY): Payer: Self-pay | Admitting: Medical

## 2022-04-12 DIAGNOSIS — Z91199 Patient's noncompliance with other medical treatment and regimen due to unspecified reason: Secondary | ICD-10-CM

## 2022-04-12 DIAGNOSIS — G4733 Obstructive sleep apnea (adult) (pediatric): Secondary | ICD-10-CM | POA: Diagnosis not present

## 2022-04-12 DIAGNOSIS — F1121 Opioid dependence, in remission: Secondary | ICD-10-CM

## 2022-04-12 NOTE — Progress Notes (Signed)
BH MD/PA/NP OP Progress Note  04/12/2022 3:27 PM Kevin Andrade  MRN:  [id number] Virtual Visit via Video Note  I Was Never connected  with Kevin Andrade on 04/12/22 at  3:00 PM EDT by a video enabled telemedicine application DESPITE 3 invitations by both phone and email.  Location: Patient: ? Provider: [facility]   I discussed the limitations of evaluation and management by telemedicine and the availability of in person appointments. The patient expressed understanding and agreed to proceed.  History of Present Illness:No Show    Observations/Objective:No show   Assessment and Plan:No Show   Follow Up Instructions:No Show       [name], PA-C   Chief Complaint: No chief complaint on file.  HPI: NO SHOW Visit Diagnosis:No show for appointment    [name], PA-C 04/12/2022, 3:27 PM

## 2022-04-12 NOTE — Patient Instructions (Signed)
          Surgical History     2 items      12/18/2018   Amputation (Right)      Date Unknown   Knee surgery (Left)

## 2022-04-12 NOTE — Telephone Encounter (Signed)
Has appt today

## 2022-04-16 ENCOUNTER — Ambulatory Visit (INDEPENDENT_AMBULATORY_CARE_PROVIDER_SITE_OTHER): Payer: BC Managed Care – PPO | Admitting: Licensed Clinical Social Worker

## 2022-04-16 ENCOUNTER — Encounter (HOSPITAL_COMMUNITY): Payer: Self-pay | Admitting: Licensed Clinical Social Worker

## 2022-04-16 DIAGNOSIS — F1121 Opioid dependence, in remission: Secondary | ICD-10-CM

## 2022-04-16 DIAGNOSIS — F32A Depression, unspecified: Secondary | ICD-10-CM | POA: Diagnosis not present

## 2022-04-16 DIAGNOSIS — F419 Anxiety disorder, unspecified: Secondary | ICD-10-CM

## 2022-04-16 NOTE — Progress Notes (Signed)
  Virtual Visit via Video Note  I connected with Kevin Andrade on 04/16/2022 at 9:00 am by a Video-enabled telemedicine application and verified that I am speaking with the correct person using two identifiers.   I discussed the limitations of evaluation and management by telemedicine and the availability of in person appointments. The patient expressed understanding and agreed to proceed.  LOCATION: Patient: Home Provider: Home Office  Therapy Goal addressed:  Pt will learn the importance of NA meetings in order to maintain sobriety AEB by 3 meeting attendance per week, working with sponsor 1x per week and completing step work.   History of Present Illness: Patient is referred for individual therapy after completion of CD-IOP for Opiod Use Disorder and hx of polysubstance abuse disorder, anxiety, depression. (Kratom & opiate pills)    Progress Toward Goal: Progressing    Observations/Objective: Patient presented for today's session on time and was alert, oriented x5, with no evidence or self-report of SI/HI or A/V H.  Patient reported ongoing compliance with medication and denied any use of alcohol or illicit substances.  Clinician inquired about patient's current emotional ratings, as well as any significant changes in thoughts, feelings or behavior since previous session.  Patient reported emotional ratings of 5/10 for depression, 5/10 for anxiety, 2/10 for anger/irritability. Cln and pt explored his emotional ratings and coping skills. Clinician utilized CBT to process concerns and work challenges. Clinician processed thoughts, feelings, and behaviors. Clinician discussed coping skills and options for supporting himself through these challenging times. Cln utilized CBT to help process updates: family, living situation, work, health, being smoke free, stress level, grandchildren,. Pt reports, "I've been sick for the past week with an unknown virus. My wife has it too. I didn't go to  work and missed my appointment with [name], PA, but the office has already rescheduled a new appt with him." "Pt discussed his decision to sell his house, "but now we're thinking about maybe renting it." Cln asked open ended questions. Cln processed with patient changes over the previous week to support sobriety and build his recovery network. Patient went to 0 meetings this past weekend due to being sick.  "I'm continuing to work on step 6, character defects and filling out the NA workbook, but I didn't do any step work this past week."      Collaboration of Care: Other: NA meetings and weekly sponsor meetings    Patient/Guardian was advised Release of Information must be obtained prior to any record release in order to collaborate their care with an outside provider. Patient/Guardian was advised if they have not already done so to contact the registration department to sign all necessary forms in order for [place] to release information regarding their care.   Consent: Patient/Guardian gives verbal consent for treatment and assignment of benefits for services provided during this visit. Patient/Guardian expressed understanding and agreed to proceed.        Follow Up Instructions: I discussed the assessment and treatment plan with the patient. The patient was provided an opportunity to ask questions and all were answered. The patient agreed with the plan and demonstrated an understanding of the instructions.   The patient was advised to call back or seek an in-person evaluation if the symptoms worsen or if the condition fails to improve as anticipated.  I provided 45 minutes of non-face-to-face time during this encounter.   Eduarda Scrivens S, LCAS

## 2022-04-23 ENCOUNTER — Encounter (HOSPITAL_COMMUNITY): Payer: Self-pay | Admitting: Licensed Clinical Social Worker

## 2022-04-23 ENCOUNTER — Ambulatory Visit (INDEPENDENT_AMBULATORY_CARE_PROVIDER_SITE_OTHER): Payer: BC Managed Care – PPO | Admitting: Licensed Clinical Social Worker

## 2022-04-23 DIAGNOSIS — F32A Depression, unspecified: Secondary | ICD-10-CM

## 2022-04-23 DIAGNOSIS — F418 Other specified anxiety disorders: Secondary | ICD-10-CM | POA: Diagnosis not present

## 2022-04-23 DIAGNOSIS — F419 Anxiety disorder, unspecified: Secondary | ICD-10-CM

## 2022-04-23 DIAGNOSIS — F1121 Opioid dependence, in remission: Secondary | ICD-10-CM | POA: Diagnosis not present

## 2022-04-23 NOTE — Progress Notes (Signed)
  Virtual Visit via Video Note  I connected with Kevin Andrade on 04/23/2022 at 9:00 am by a Video-enabled telemedicine application and verified that I am speaking with the correct person using two identifiers.   I discussed the limitations of evaluation and management by telemedicine and the availability of in person appointments. The patient expressed understanding and agreed to proceed.  LOCATION: Patient: Home Provider: Home Office  Therapy Goal addressed:  Pt will learn the importance of NA meetings in order to maintain sobriety AEB by 3 meeting attendance per week, working with sponsor 1x per week and completing step work.   History of Present Illness: Patient is referred for individual therapy after completion of CD-IOP for Opiod Use Disorder and hx of polysubstance abuse disorder, anxiety, depression. (Kratom & opiate pills)    Progress Toward Goal: Progressing    Observations/Objective: Patient presented for today's session on time and was alert, oriented x5, with no evidence or self-report of SI/HI or A/V H.  Patient reported ongoing compliance with medication and denied any use of alcohol or illicit substances.  Clinician inquired about patient's current emotional ratings, as well as any significant changes in thoughts, feelings or behavior since previous session.  Patient reported emotional ratings of 5/10 for depression, 5/10 for anxiety, 2/10 for anger/irritability. Cln and pt explored his emotional ratings and coping skills. Clinician utilized CBT to process concerns and challenges. Clinician processed thoughts, feelings, and behaviors. Clinician discussed coping skills and options for supporting himself. Cln utilized CBT to help process updates: family, living situation, work, health, being smoke free, stress level, grandchildren,. Pt reports, "I feel so unsettled. I'm not living in my home but I'm paying 2 mortgages." Cln and pt reviewed reasons he started using: feeling  tired all the time at work. Cln and pt reviewed those triggers. Cln processed with patient changes over the previous week to support sobriety and build his recovery network. Patient went to 3 meetings this past weekend, meeting with sponsor.Marland Kitchen  "I'm continuing to work on step 6, character defects and filling out the NA workbook." Patient had to end session early due to work meeting.      Collaboration of Care: Other: NA meetings and weekly sponsor meetings    Patient/Guardian was advised Release of Information must be obtained prior to any record release in order to collaborate their care with an outside provider. Patient/Guardian was advised if they have not already done so to contact the registration department to sign all necessary forms in order for [place] to release information regarding their care.   Consent: Patient/Guardian gives verbal consent for treatment and assignment of benefits for services provided during this visit. Patient/Guardian expressed understanding and agreed to proceed.        Follow Up Instructions: I discussed the assessment and treatment plan with the patient. The patient was provided an opportunity to ask questions and all were answered. The patient agreed with the plan and demonstrated an understanding of the instructions.   The patient was advised to call back or seek an in-person evaluation if the symptoms worsen or if the condition fails to improve as anticipated.  I provided 30 minutes of non-face-to-face time during this encounter.   Jensen Cheramie S, LCAS

## 2022-05-05 ENCOUNTER — Other Ambulatory Visit: Payer: Self-pay | Admitting: Family Medicine

## 2022-05-05 DIAGNOSIS — F17210 Nicotine dependence, cigarettes, uncomplicated: Secondary | ICD-10-CM

## 2022-05-07 ENCOUNTER — Encounter (HOSPITAL_COMMUNITY): Payer: Self-pay | Admitting: Licensed Clinical Social Worker

## 2022-05-07 ENCOUNTER — Ambulatory Visit (INDEPENDENT_AMBULATORY_CARE_PROVIDER_SITE_OTHER): Payer: BC Managed Care – PPO | Admitting: Licensed Clinical Social Worker

## 2022-05-07 DIAGNOSIS — F1121 Opioid dependence, in remission: Secondary | ICD-10-CM

## 2022-05-07 DIAGNOSIS — F32A Depression, unspecified: Secondary | ICD-10-CM | POA: Diagnosis not present

## 2022-05-07 DIAGNOSIS — F419 Anxiety disorder, unspecified: Secondary | ICD-10-CM | POA: Diagnosis not present
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
## 2022-05-13 DIAGNOSIS — G4733 Obstructive sleep apnea (adult) (pediatric): Secondary | ICD-10-CM | POA: Diagnosis not present

## 2022-05-14 ENCOUNTER — Ambulatory Visit (INDEPENDENT_AMBULATORY_CARE_PROVIDER_SITE_OTHER): Payer: BC Managed Care – PPO | Admitting: Licensed Clinical Social Worker

## 2022-05-14 ENCOUNTER — Encounter (HOSPITAL_COMMUNITY): Payer: Self-pay | Admitting: Licensed Clinical Social Worker

## 2022-05-14 DIAGNOSIS — F1121 Opioid dependence, in remission: Secondary | ICD-10-CM

## 2022-05-14 DIAGNOSIS — F419 Anxiety disorder, unspecified: Secondary | ICD-10-CM | POA: Diagnosis not present

## 2022-05-14 DIAGNOSIS — F32A Depression, unspecified: Secondary | ICD-10-CM | POA: Diagnosis not present

## 2022-05-14 NOTE — Progress Notes (Signed)
  Virtual Visit via Video Note  I connected with Kevin Andrade on 05/14/2022 at 9:00 am by a Video-enabled telemedicine application and verified that I am speaking with the correct person using two identifiers.   I discussed the limitations of evaluation and management by telemedicine and the availability of in person appointments. The patient expressed understanding and agreed to proceed.  LOCATION: Patient: Home Provider: Home Office  Therapy Goal addressed:  Pt will learn the importance of NA meetings in order to maintain sobriety AEB by 3 meeting attendance per week, working with sponsor 1x per week and completing step work.   History of Present Illness: Patient is referred for individual therapy after completion of CD-IOP for Opiod Use Disorder and hx of polysubstance abuse disorder, anxiety, depression. (Kratom & opiate pills)    Progress Toward Goal: Progressing    Observations/Objective: Patient presented for today's session on time and was alert, oriented x5, with no evidence or self-report of SI/HI or A/V H.  Patient reported ongoing compliance with medication and denied any use of alcohol or illicit substances.  Clinician inquired about patient's current emotional ratings, as well as any significant changes in thoughts, feelings or behavior since previous session.  Patient reported emotional ratings of 5/10 for depression, 5/10 for anxiety, 2/10 for anger/irritability. Cln and pt explored his emotional ratings and coping skills. Pt reports he and his siblings and his mother got together for dinner. "I had hopes our family could come together as a "normal" family." Cln asked open ended questions. Clinician utilized CBT to process concerns and challenges. Clinician processed thoughts, feelings, and behaviors. Clinician discussed coping skills. Cln utilized CBT to help process updates: family, living situation, work, health, being smoke free, stress level, grandchildren. Cln asked  open ended questions. Cln processed with patient changes over the previous week to support sobriety and build his recovery network. Patient went to 2 meetings this past weekend.  "I'm continuing to work on step 6, character defects and filling out the NA workbook.     Collaboration of Care: Other: NA meetings and weekly sponsor meetings    Patient/Guardian was advised Release of Information must be obtained prior to any record release in order to collaborate their care with an outside provider. Patient/Guardian was advised if they have not already done so to contact the registration department to sign all necessary forms in order for [place] to release information regarding their care.   Consent: Patient/Guardian gives verbal consent for treatment and assignment of benefits for services provided during this visit. Patient/Guardian expressed understanding and agreed to proceed.        Follow Up Instructions: I discussed the assessment and treatment plan with the patient. The patient was provided an opportunity to ask questions and all were answered. The patient agreed with the plan and demonstrated an understanding of the instructions.   The patient was advised to call back or seek an in-person evaluation if the symptoms worsen or if the condition fails to improve as anticipated.  I provided 45 minutes of non-face-to-face time during this encounter.   Ercell Perlman S, LCAS

## 2022-05-18 DIAGNOSIS — G4733 Obstructive sleep apnea (adult) (pediatric): Secondary | ICD-10-CM | POA: Diagnosis not present

## 2022-05-21 ENCOUNTER — Ambulatory Visit (INDEPENDENT_AMBULATORY_CARE_PROVIDER_SITE_OTHER): Payer: BC Managed Care – PPO | Admitting: Licensed Clinical Social Worker

## 2022-05-21 ENCOUNTER — Encounter (HOSPITAL_COMMUNITY): Payer: Self-pay | Admitting: Licensed Clinical Social Worker

## 2022-05-21 DIAGNOSIS — F419 Anxiety disorder, unspecified: Secondary | ICD-10-CM

## 2022-05-21 DIAGNOSIS — F32A Depression, unspecified: Secondary | ICD-10-CM | POA: Diagnosis not present

## 2022-05-21 DIAGNOSIS — F1121 Opioid dependence, in remission: Secondary | ICD-10-CM | POA: Diagnosis not present

## 2022-05-21 NOTE — Progress Notes (Signed)
  Virtual Visit via Video Note  I connected with Kevin Andrade on 05/21/2022 at 9:00 am by a Video-enabled telemedicine application and verified that I am speaking with the correct person using two identifiers.   I discussed the limitations of evaluation and management by telemedicine and the availability of in person appointments. The patient expressed understanding and agreed to proceed.  LOCATION: Patient: Home Provider: Home Office  Therapy Goal addressed:  Pt will learn the importance of NA meetings in order to maintain sobriety AEB by 3 meeting attendance per week, working with sponsor 1x per week and completing step work.   History of Present Illness: Patient is referred for individual therapy after completion of CD-IOP for Opiod Use Disorder and hx of polysubstance abuse disorder, anxiety, depression. (Kratom & opiate pills)    Progress Toward Goal: Progressing    Observations/Objective: Patient presented for today's session on time and was alert, oriented x5, with no evidence or self-report of SI/HI or A/V H.  Patient reported ongoing compliance with medication and denied any use of alcohol or illicit substances.  Clinician inquired about patient's current emotional ratings, as well as any significant changes in thoughts, feelings or behavior since previous session.  Patient reported emotional ratings of 5/10 for depression, 5/10 for anxiety, 2/10 for anger/irritability. Cln and pt explored his emotional ratings and coping skills. Pt provided updates:  Cln utilized CBT to help process updates: family, living situation, work, health, being smoke free, stress level, grandchildren. Cln asked open ended questions. Cln processed with patient changes over the previous week to support sobriety and build his recovery network. Patient went to 2 meetings this past weekend and met with his sponsor.  "I'm continuing to work on step 6, character defects, turning everything over to [employer] . I'm still struggling with HP. Cln and pt discussed HP and faith. Cln still encourages pt to complete his NA workbook.     Collaboration of Care: Other: NA meetings and weekly sponsor meetings    Patient/Guardian was advised Release of Information must be obtained prior to any record release in order to collaborate their care with an outside provider. Patient/Guardian was advised if they have not already done so to contact the registration department to sign all necessary forms in order for [place] to release information regarding their care.   Consent: Patient/Guardian gives verbal consent for treatment and assignment of benefits for services provided during this visit. Patient/Guardian expressed understanding and agreed to proceed.        Follow Up Instructions: I discussed the assessment and treatment plan with the patient. The patient was provided an opportunity to ask questions and all were answered. The patient agreed with the plan and demonstrated an understanding of the instructions.   The patient was advised to call back or seek an in-person evaluation if the symptoms worsen or if the condition fails to improve as anticipated.  I provided 60 minutes of non-face-to-face time during this encounter.   Elis Sauber S, LCAS

## 2022-05-22 ENCOUNTER — Telehealth: Payer: Self-pay | Admitting: *Deleted

## 2022-05-22 NOTE — Patient Outreach (Signed)
  Care Coordination   05/22/2022 Name: Kevin Andrade MRN: [id number] DOB: 22-Dec-1970   Care Coordination Outreach Attempts:  An unsuccessful telephone outreach was attempted today to offer the patient information about available care coordination services as a benefit of their health plan.   Follow Up Plan:  Additional outreach attempts will be made to offer the patient care coordination information and services.   Encounter Outcome:  No Answer  Care Coordination Interventions Activated:  No   Care Coordination Interventions:  No, not indicated    Raina Mina, RN Care Management Coordinator [place] Office [phone]

## 2022-05-28 ENCOUNTER — Encounter (HOSPITAL_COMMUNITY): Payer: Self-pay | Admitting: Licensed Clinical Social Worker

## 2022-05-28 ENCOUNTER — Ambulatory Visit (INDEPENDENT_AMBULATORY_CARE_PROVIDER_SITE_OTHER): Payer: BC Managed Care – PPO | Admitting: Licensed Clinical Social Worker

## 2022-05-28 DIAGNOSIS — F32A Depression, unspecified: Secondary | ICD-10-CM | POA: Diagnosis not present

## 2022-05-28 DIAGNOSIS — F1121 Opioid dependence, in remission: Secondary | ICD-10-CM

## 2022-05-28 DIAGNOSIS — F419 Anxiety disorder, unspecified: Secondary | ICD-10-CM | POA: Diagnosis not present

## 2022-05-28 NOTE — Progress Notes (Signed)
  Virtual Visit via Video Note  I connected with Kevin Andrade on 05/28/2022 at 9:15 am by a Video-enabled telemedicine application and verified that I am speaking with the correct person using two identifiers.   I discussed the limitations of evaluation and management by telemedicine and the availability of in person appointments. The patient expressed understanding and agreed to proceed.  LOCATION: Patient: Home Provider: Home Office  Therapy Goal addressed:  Pt will learn the importance of NA meetings in order to maintain sobriety AEB by 3 meeting attendance per week, working with sponsor 1x per week and completing step work.   History of Present Illness: Patient is referred for individual therapy after completion of CD-IOP for Opiod Use Disorder and hx of polysubstance abuse disorder, anxiety, depression. (Kratom & opiate pills)    Progress Toward Goal: Progressing    Observations/Objective: Patient presented for today's session on time and was alert, oriented x5, with no evidence or self-report of SI/HI or A/V H.  Patient reported ongoing compliance with medication and denied any use of alcohol or illicit substances.  Clinician inquired about patient's current emotional ratings, as well as any significant changes in thoughts, feelings or behavior since previous session.  Patient reported emotional ratings of 6/10 for depression, 5/10 for anxiety, 2/10 for anger/irritability. Cln and pt explored his emotional ratings and coping skills. Cln utilized CBT to help process updates: family, living situation, work, health, being smoke free, stress level, grandchildren.  Cln reports, "I've been depressed this week. I can't pinpoint the reason." Cln and pt reviewed reasons he could be more depressed: Missing his drug, confusion about living arrangements, frustration about cleaning outbuildings at both houses (too much), Cln processed with patient changes over the previous week to support  sobriety and build his recovery network. Patient went to 2 meetings this past weekend and met with his sponsor.  "I'm continuing to work on step 6, character defects, turning everything over to [employer]. I'm still struggling with HP. Again, Cln and pt discussed HP and faith. Cln still encourages pt to work in his NA workbook.     Collaboration of Care: Other: NA meetings and weekly sponsor meetings    Patient/Guardian was advised Release of Information must be obtained prior to any record release in order to collaborate their care with an outside provider. Patient/Guardian was advised if they have not already done so to contact the registration department to sign all necessary forms in order for [place] to release information regarding their care.   Consent: Patient/Guardian gives verbal consent for treatment and assignment of benefits for services provided during this visit. Patient/Guardian expressed understanding and agreed to proceed.        Follow Up Instructions: I discussed the assessment and treatment plan with the patient. The patient was provided an opportunity to ask questions and all were answered. The patient agreed with the plan and demonstrated an understanding of the instructions.   The patient was advised to call back or seek an in-person evaluation if the symptoms worsen or if the condition fails to improve as anticipated.  I provided 45 minutes of non-face-to-face time during this encounter.   Yulanda Diggs S, LCAS

## 2022-05-31 ENCOUNTER — Ambulatory Visit: Payer: BC Managed Care – PPO | Admitting: Family Medicine

## 2022-06-06 ENCOUNTER — Ambulatory Visit: Payer: BC Managed Care – PPO | Admitting: Family Medicine

## 2022-06-11 ENCOUNTER — Encounter: Payer: Self-pay | Admitting: Neurology

## 2022-06-11 ENCOUNTER — Ambulatory Visit (INDEPENDENT_AMBULATORY_CARE_PROVIDER_SITE_OTHER): Payer: BC Managed Care – PPO | Admitting: Neurology

## 2022-06-11 VITALS — BP 124/76 | HR 77 | Ht 73.0 in | Wt 280.0 lb

## 2022-06-11 DIAGNOSIS — G4719 Other hypersomnia: Secondary | ICD-10-CM | POA: Diagnosis not present

## 2022-06-11 DIAGNOSIS — G4733 Obstructive sleep apnea (adult) (pediatric): Secondary | ICD-10-CM

## 2022-06-11 DIAGNOSIS — G4753 Recurrent isolated sleep paralysis: Secondary | ICD-10-CM

## 2022-06-11 DIAGNOSIS — F17211 Nicotine dependence, cigarettes, in remission: Secondary | ICD-10-CM

## 2022-06-11 DIAGNOSIS — R5382 Chronic fatigue, unspecified: Secondary | ICD-10-CM | POA: Diagnosis not present

## 2022-06-11 NOTE — Progress Notes (Signed)
SLEEP MEDICINE CLINIC    Provider:  Melvyn Novas, MD  Primary Care Physician:  Deeann Saint, MD [address] [place] [address]     Referring Provider: Deeann Saint, Md [address] [place],  [place] [address]          Chief Complaint according to patient   Patient presents with:     New Patient (Initial Visit)     Pt alone, rm 10. Presents today as SLP eval due to having problems with ongoing fatigue. Avg 8 hrs of sleep at night but states will wake up frequently during the night and when he wakes up to start his day he is still exhausted. Never had a SS and wife has told him snoring is getting worse      HISTORY OF PRESENT ILLNESS:  Kevin Andrade  ( pronounced Rhoden')is a 51 y.o. year old White or Caucasian male patient seen here upon consultation by Abbe Amsterdam, MD , in RV on 06/11/2022 .  This is Mr. Bowden's first appointment since his home sleep test which was performed on 03-28-2022.  The pretest Epworth sleepiness score was endorsed at 20 out of 24 points, the patient's BMI was 36.5, neck circumference 19 inches remarkable was that the patient had a very strongly REM sleep dependent AHI.  The overall apnea degree was mild 15.5/h the REM pAHI was 34.4 so more than double as high.  Snoring volume reached the mean level of 52 dB which was loud.  And he was set up with an outer titration device and today we are meeting again he has been 90% compliant by days and 83% compliant by over 4 hours of daily use with an average of 8 hours and 13 minutes.  The minimum pressure for his machine is 6 the maximum pressure of 16 cmH2O with 3 cm expiratory relief.   The residual AHI is 1.7/h so a significant decrease and there are no central apneas arising the 95th percentile pressure is 15.5 cm water so I should increase his maximum pressure by at least 2 cm water.  He does have some moderate high air leak which is related to facial hair. FFM.   Subjective  benefit-the patient's wife has noted that his sleep is much less restless he is not moving as much, he states that he sleeps through and 1 go has no bathroom breaks fragmenting his sleep until the morning hours when he has to get up.  He did not have morning headaches before CPAP started so this would not be an improvement.  But he is much less exhausted during the day.  Also his wife endorsed that he is no longer snoring.  His Epworth sleepiness score has been reduced from 20-9 points , now on CPAP.           I have the pleasure of seeing Kevin Andrade on 02-14-2022, a right -handed  Caucasian male with a possible sleep disorder.  He has a past medical history of Anxiety, Hypersomnia, persistent, Insomnia with witnessed sleep apnea, suspected Obstructive Sleep apnea, remote history of Substance abuse ([name]), and hx of Tobacco use disorder until May 2023. His hypersomnia was reason for sleep consult in 2014, requested by Dr Merla Riches, when he was working out of town and irregular hours, this has changed. No trouble with sleep initiation insomnia.      Sleep relevant medical history: Nocturia, 2-3 times, no deviated septum repair, thyroid disease, goiter.  He gained weight over the last 10 years, and reached a BMI of 36.   Family medical /sleep history: no other biological family member on CPAP with OSA, insomnia, sleep walkers.    Social history:  Patient is working as a Pensions consultant , Engineer, technical sales, help, for [employer] lives in a household with spouse,  one dog.  The patient currently works  12 noon - 9 PM. Tobacco use- yes, quit last months- 30 years.  ETOH use : none , quit 2015,  Caffeine intake in form of Coffee( 4 cups a day) Soda( 3-4) Tea ( /) and prn energy drinks ( [employer]) . Regular exercise in form of  on the job.   Hobbies : gardening      Sleep habits are as follows: The patient's dinner time is between 10 PM. The patient goes to bed at 11 PM and continues to sleep in  intervals of  2 hours, wakes for 2-3 bathroom breaks.  He sleeps restless. His hands and arms fall asleep- The preferred sleep position is on his L >R  sides, with the support of 2 pillows.  Dreams are reportedly very frequent/vivid. He is on chantix now (!)  7 AM is the usual rise time. The patient wakes up spontaneously  He reports not feeling refreshed or restored in AM, with symptoms such as dry mouth, neck stiffness, dry eyes, morning headaches, and residual fatigue.  Naps are taken frequently, lasting from 20 to 35 minutes and are refreshing . He doesn't remember dreaming during naps   Review of Systems: Out of a complete 14 system review, the patient complains of only the following symptoms, and all other reviewed systems are negative.:  PRE CPAP : Fatigue, sleepiness , snoring, fragmented sleep, No MVA due to sleepiness, but coming close.  Sleepy when on video calls, over charting, when reading.    How likely are you to doze in the following situations: 0 = not likely, 1 = slight chance, 2 = moderate chance, 3 = high chance   Sitting and Reading? Watching Television? Sitting inactive in a public place (theater or meeting)? As a passenger in a car for an hour without a break? Lying down in the afternoon when circumstances permit? Sitting and talking to someone? Sitting quietly after lunch without alcohol? In a car, while stopped for a few minutes in traffic?   PRE CPAP Total = 20/ 24 points / FSS endorsed at 61/ 63 points. BOTH HIGH.  Post CPAP 9/ 24 points and FSS at 43/ 63.  Post CPAP better exercise tolerance, carrying  tools , ladders with less fatigue.   No SOB . Has quit smoking in May 2023.  Social History   Socioeconomic History   Marital status: Married    Spouse name: [name]   Number of children: 2   Years of education: HS/ GED   Highest education level: Training at employer  Occupational History   Not on file  Tobacco Use   Smoking status: Former     Packs/day: 2.00    Years: 20.00    Total pack years: 40.00    Types: Cigarettes    Quit date: 01/04/2022    Years since quitting: 0.1- May/June 2023   Smokeless tobacco: Former  Building services engineer Use: Never used  Substance and Sexual Activity   Alcohol use: No   Drug use: No   Sexual activity: Yes  Other Topics Concern   Not on file  Social History Narrative  Patient lives at home with spouse.   Caffeine Use: 2 L daily   Social Determinants of Health   Financial Resource Strain: Not on file  Food Insecurity: Not on file  Transportation Needs: Not on file  Physical Activity: Not on file  Stress: Not on file  Social Connections: Not on file    Family History  Problem Relation Age of Onset   Diabetes Mother    Arthritis Mother    Cancer Father        lymphoma   Hyperlipidemia Father    Alcohol abuse Father    Arthritis Father    Depression Father    Diabetes Father    Stroke Father     Past Medical History:  Diagnosis Date   Anxiety    Hypersomnia, persistent    Insomnia with sleep apnea    Sleep apnea    Substance abuse ([place])    Tobacco use disorder     Past Surgical History:  Procedure Laterality Date   AMPUTATION Right 12/18/2018   Procedure: RIGHT DISTAL PHALANX INDEX FINGER AMPUTATION;  Surgeon: Leanora Cover, MD;  Location: [facility];  Service: Orthopedics;  Laterality: Right;   KNEE SURGERY Left      Current Outpatient Medications on File Prior to Visit  Medication Sig Dispense Refill   ARIPiprazole (ABILIFY) 10 MG tablet Take 1 tablet (10 mg total) by mouth daily. 90 tablet 1   ibuprofen (ADVIL) 200 MG tablet Take 200 mg by mouth every 6 (six) hours as needed.     naltrexone (DEPADE) 50 MG tablet Take 1 tablet (50 mg total) by mouth daily. 90 tablet 1   pregabalin (LYRICA) 150 MG capsule Take 1 capsule (150 mg total) by mouth in the morning, at noon, and at bedtime. 90 capsule 5   TRINTELLIX 20 MG TABS tablet TAKE 1 TABLET BY  MOUTH EVERY DAY 90 tablet 0   varenicline ([name]) 1 MG tablet Take 1 tablet (1 mg total) by mouth 2 (two) times daily. 180 tablet 1   No current facility-administered medications on file prior to visit.    No Known Allergies  Physical exam:  Today's Vitals   06/11/22 0802  BP: 124/76  Pulse: 77  Weight: 280 lb (127 kg)  Height: 6\' 1"  (1.854 m)   Body mass index is 36.94 kg/m.   Wt Readings from Last 3 Encounters:  06/11/22 280 lb (127 kg)  02/28/22 275 lb 6.4 oz (124.9 kg)  02/14/22 276 lb (125.2 kg)     Ht Readings from Last 3 Encounters:  06/11/22 6\' 1"  (1.854 m)  02/14/22 6\' 1"  (1.854 m)  04/20/21 6\' 1"  (1.854 m)      General: The patient is awake, alert and appears not in acute distress. The patient is well groomed. Head: Normocephalic, atraumatic.  Neck is supple. Mallampati 3,  neck circumference:19 inches . Hoarse voice. Nasal airflow patent.  Retrognathia is not seen.  Dental status: biological  Cardiovascular:  Regular rate and cardiac rhythm by pulse,  without distended neck veins. Respiratory: Lungs are clear to auscultation.  Skin:  With evidence of ankle edema, no rash. Sun burned Trunk: The patient's posture is erect.   Neurologic exam : The patient is awake and alert, oriented to place and time.   Memory subjective described as intact.  Attention span & concentration ability appears normal.  Speech is fluent,  with dysphonia , subjectively reporting aphasia.  Mood and affect are appropriate.   Cranial nerves:  no loss of smell or taste reported  Pupils are equal and briskly reactive to light. Funduscopic exam deferred. .  Extraocular movements in vertical and horizontal planes were intact and without nystagmus. No Diplopia. Visual fields by finger perimetry are intact. Hearing was intact to tuning fork. No tinnitus.  Facial sensation intact to fine touch.  Facial motor strength is symmetric and tongue and uvula move midline.  Neck ROM :  rotation, tilt and flexion extension were normal for age and shoulder shrug was symmetrical.    Motor exam:  Symmetric bulk, tone and ROM.   Normal tone without cog wheeling, he has symmetric grip strength .   Sensory:   vibration , fine touch were slightly reduced at both ankles.  Proprioception tested in the upper extremities was normal.    Gait and station: Patient could rise unassisted from a seated position, walked without assistive device.  Stance is of normal width/ base and the patient turned with 3 steps.  Toe and heel walk were deferred.  Deep tendon reflexes: in the  upper and lower extremities are symmetric and intact.  Status post arthroscopic knee surgery for meniscus tear right leg.  Babinski response was deferred.         After spending a total time of 25  minutes face to face and additional time for physical and neurologic examination, review of laboratory studies,  personal review of imaging studies, reports and results of other testing and review of referral information / records as far as provided in visit, I have established the following assessments:   1) Mild- moderate OSA, strongly REM sleep dependent . CPAP well tolerated - Very good resolution of apnea, but straddles the maximum pressure.  Resolution of  excessive daytime sleepiness, excessive fatigue, SOB, and witnessed snoring, loudest in supine sleep. Smoking cessation has been successful.    My Plan is to proceed with:  1) Increasing the upper pressure window by 2 cm Water . Continue CPAP use, every night and for 4 hours minimum.  2) RV yearly .    I would like to thank Deeann Saint, MD and Deeann Saint, Md [address] [place],  [place] [address] for allowing me to meet with and to take care of this pleasant patient.  I plan to follow up either personally or through our NP within 12 month.   CC: I will share my notes with PCP.  Electronically signed by: Melvyn Novas, MD  06/11/2022 8:33 AM  [facility] Board certified by The [employer] of Sleep Medicine and Diplomate of the [employer] of Sleep Medicine. Board certified In Neurology through the ABPN, Fellow of the [employer] of Neu[employer]. Medical Director of [facility].

## 2022-06-11 NOTE — Patient Instructions (Signed)

## 2022-06-12 ENCOUNTER — Telehealth: Payer: Self-pay

## 2022-06-12 DIAGNOSIS — G4733 Obstructive sleep apnea (adult) (pediatric): Secondary | ICD-10-CM | POA: Diagnosis not present

## 2022-06-18 ENCOUNTER — Encounter: Payer: Self-pay | Admitting: Neurology

## 2022-06-18 ENCOUNTER — Telehealth: Payer: Self-pay | Admitting: Neurology

## 2022-06-18 NOTE — Telephone Encounter (Signed)
Replied to pt mychart message. Advised we will get this corrected and sent to [facility] instead for him.   I faxed order to [facility] at [phone]. Received fax confirmation.

## 2022-06-18 NOTE — Telephone Encounter (Signed)
Pt is calling. Said Dr. Brett Fairy sent a request to up the air pressure on his CPAP to the wrong place. Pt is requesting a call back from nurse.

## 2022-06-19 ENCOUNTER — Encounter (HOSPITAL_COMMUNITY): Payer: Self-pay | Admitting: Licensed Clinical Social Worker

## 2022-06-19 ENCOUNTER — Ambulatory Visit (HOSPITAL_COMMUNITY): Payer: BC Managed Care – PPO | Admitting: Licensed Clinical Social Worker

## 2022-07-03 ENCOUNTER — Ambulatory Visit (HOSPITAL_COMMUNITY): Payer: BC Managed Care – PPO | Admitting: Licensed Clinical Social Worker

## 2022-07-12 ENCOUNTER — Telehealth (HOSPITAL_BASED_OUTPATIENT_CLINIC_OR_DEPARTMENT_OTHER): Payer: Self-pay | Admitting: Medical

## 2022-07-12 ENCOUNTER — Encounter (HOSPITAL_COMMUNITY): Payer: Self-pay | Admitting: Medical

## 2022-07-12 DIAGNOSIS — G4700 Insomnia, unspecified: Secondary | ICD-10-CM

## 2022-07-12 DIAGNOSIS — T7492XS Unspecified child maltreatment, confirmed, sequela: Secondary | ICD-10-CM

## 2022-07-12 DIAGNOSIS — G4730 Sleep apnea, unspecified: Secondary | ICD-10-CM

## 2022-07-12 DIAGNOSIS — F1121 Opioid dependence, in remission: Secondary | ICD-10-CM

## 2022-07-12 DIAGNOSIS — Z91199 Patient's noncompliance with other medical treatment and regimen due to unspecified reason: Secondary | ICD-10-CM

## 2022-07-12 DIAGNOSIS — F1421 Cocaine dependence, in remission: Secondary | ICD-10-CM

## 2022-07-12 DIAGNOSIS — F1021 Alcohol dependence, in remission: Secondary | ICD-10-CM

## 2022-07-12 DIAGNOSIS — F4312 Post-traumatic stress disorder, chronic: Secondary | ICD-10-CM

## 2022-07-12 DIAGNOSIS — F341 Dysthymic disorder: Secondary | ICD-10-CM

## 2022-07-12 DIAGNOSIS — Z811 Family history of alcohol abuse and dependence: Secondary | ICD-10-CM

## 2022-07-12 DIAGNOSIS — E669 Obesity, unspecified: Secondary | ICD-10-CM

## 2022-07-12 DIAGNOSIS — F32A Depression, unspecified: Secondary | ICD-10-CM

## 2022-07-12 DIAGNOSIS — F17201 Nicotine dependence, unspecified, in remission: Secondary | ICD-10-CM

## 2022-07-12 DIAGNOSIS — F419 Anxiety disorder, unspecified: Secondary | ICD-10-CM
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
## 2022-07-13 DIAGNOSIS — G4733 Obstructive sleep apnea (adult) (pediatric): Secondary | ICD-10-CM | POA: Diagnosis not present

## 2022-07-17 ENCOUNTER — Ambulatory Visit (HOSPITAL_COMMUNITY): Payer: BC Managed Care – PPO | Admitting: Licensed Clinical Social Worker

## 2022-07-29 ENCOUNTER — Other Ambulatory Visit (HOSPITAL_COMMUNITY): Payer: Self-pay | Admitting: Medical

## 2022-07-31 ENCOUNTER — Ambulatory Visit (HOSPITAL_COMMUNITY): Payer: BC Managed Care – PPO | Admitting: Licensed Clinical Social Worker

## 2022-08-02 NOTE — Telephone Encounter (Signed)
Pt needs appointment

## 2022-08-12 DIAGNOSIS — G4733 Obstructive sleep apnea (adult) (pediatric): Secondary | ICD-10-CM | POA: Diagnosis not present

## 2022-08-13 ENCOUNTER — Telehealth: Payer: Self-pay | Admitting: *Deleted

## 2022-08-13 NOTE — Patient Outreach (Signed)
  Care Coordination   08/13/2022 Name: Kevin Andrade MRN: [id number] DOB: 1970-12-24   Care Coordination Outreach Attempts:  A third unsuccessful outreach was attempted today to offer the patient with information about available care coordination services as a benefit of their health plan.   Follow Up Plan:  No further outreach attempts will be made at this time. We have been unable to contact the patient to offer or enroll patient in care coordination services  Encounter Outcome:  No Answer   Care Coordination Interventions:  No, not indicated    Elliot Cousin, RN Care Management Coordinator [facility] Main Office [phone]

## 2022-08-14 ENCOUNTER — Ambulatory Visit (HOSPITAL_COMMUNITY): Payer: BC Managed Care – PPO | Admitting: Licensed Clinical Social Worker

## 2022-08-17 ENCOUNTER — Ambulatory Visit (INDEPENDENT_AMBULATORY_CARE_PROVIDER_SITE_OTHER): Payer: BC Managed Care – PPO | Admitting: Licensed Clinical Social Worker

## 2022-08-17 DIAGNOSIS — F419 Anxiety disorder, unspecified: Secondary | ICD-10-CM | POA: Diagnosis not present

## 2022-08-17 DIAGNOSIS — F32A Depression, unspecified: Secondary | ICD-10-CM | POA: Diagnosis not present

## 2022-08-17 DIAGNOSIS — F1121 Opioid dependence, in remission: Secondary | ICD-10-CM | POA: Diagnosis not present

## 2022-08-21 ENCOUNTER — Other Ambulatory Visit (HOSPITAL_COMMUNITY): Payer: Self-pay | Admitting: Medical

## 2022-08-23 NOTE — Telephone Encounter (Signed)
Needs appt

## 2022-08-31 ENCOUNTER — Ambulatory Visit (INDEPENDENT_AMBULATORY_CARE_PROVIDER_SITE_OTHER): Payer: BC Managed Care – PPO | Admitting: Licensed Clinical Social Worker

## 2022-08-31 DIAGNOSIS — F1121 Opioid dependence, in remission: Secondary | ICD-10-CM | POA: Diagnosis not present

## 2022-08-31 DIAGNOSIS — F418 Other specified anxiety disorders: Secondary | ICD-10-CM

## 2022-08-31 DIAGNOSIS — F32A Depression, unspecified: Secondary | ICD-10-CM

## 2022-08-31 DIAGNOSIS — F419 Anxiety disorder, unspecified: Secondary | ICD-10-CM

## 2022-09-07 ENCOUNTER — Ambulatory Visit (INDEPENDENT_AMBULATORY_CARE_PROVIDER_SITE_OTHER): Payer: BC Managed Care – PPO | Admitting: Licensed Clinical Social Worker

## 2022-09-07 DIAGNOSIS — F1121 Opioid dependence, in remission: Secondary | ICD-10-CM

## 2022-09-07 DIAGNOSIS — F419 Anxiety disorder, unspecified: Secondary | ICD-10-CM | POA: Diagnosis not present

## 2022-09-07 DIAGNOSIS — F32A Depression, unspecified: Secondary | ICD-10-CM

## 2022-09-10 ENCOUNTER — Encounter (HOSPITAL_COMMUNITY): Payer: Self-pay | Admitting: Licensed Clinical Social Worker

## 2022-09-10 NOTE — Progress Notes (Signed)
  Virtual Visit via Video Note  I connected with Kevin Andrade on 1012/22/2023 at 11:00 am by a Video-enabled telemedicine application and verified that I am speaking with the correct person using two identifiers.   I discussed the limitations of evaluation and management by telemedicine and the availability of in person appointments. The patient expressed understanding and agreed to proceed.  LOCATION: Patient: Home Provider: Home Office  Therapy Goal addressed:  Pt will learn the importance of NA meetings in order to maintain sobriety AEB by 3 meeting attendance per week, working with sponsor 1x per week and completing step work.   History of Present Illness: Patient is referred for individual therapy after completion of CD-IOP for Opiod Use Disorder and hx of polysubstance abuse disorder, anxiety, depression. (Kratom & opiate pills)    Progress Toward Goal: Progressing    Observations/Objective: Patient presented for today's session on time and was alert, oriented x5, with no evidence or self-report of SI/HI or A/V H.  Patient reported ongoing compliance with medication and denied any use of alcohol or illicit substances.  Clinician inquired about patient's current emotional ratings, as well as any significant changes in thoughts, feelings or behavior since previous session.  Patient reported emotional ratings of 6/10 for depression, 5/10 for anxiety, 2/10 for anger/irritability. Cln and pt explored his emotional ratings and coping skills. Today is the first day of therapy as the therapist was out on FMLA.       Collaboration of Care: Other: NA meetings and weekly sponsor meetings    Patient/Guardian was advised Release of Information must be obtained prior to any record release in order to collaborate their care with an outside provider. Patient/Guardian was advised if they have not already done so to contact the registration department to sign all necessary forms in order  for [place] to release information regarding their care.   Consent: Patient/Guardian gives verbal consent for treatment and assignment of benefits for services provided during this visit. Patient/Guardian expressed understanding and agreed to proceed.     Assessment and plan: Counselor will continue to meet with patient to address treatment plan goals. Patient will continue to follow recommendations of providers and implement skills learned in session.   Follow Up Instructions: I discussed the assessment and treatment plan with the patient. The patient was provided an opportunity to ask questions and all were answered. The patient agreed with the plan and demonstrated an understanding of the instructions.   The patient was advised to call back or seek an in-person evaluation if the symptoms worsen or if the condition fails to improve as anticipated.  I provided 45 minutes of non-face-to-face time during this encounter.   Nicolaas Savo S, LCAS

## 2022-09-12 ENCOUNTER — Telehealth (HOSPITAL_COMMUNITY): Payer: Self-pay | Admitting: *Deleted

## 2022-09-12 DIAGNOSIS — G4733 Obstructive sleep apnea (adult) (pediatric): Secondary | ICD-10-CM | POA: Diagnosis not present

## 2022-09-12 NOTE — Telephone Encounter (Signed)
Pt called requesting refills. Pt has multiple no shows but continues to attend therapy sessions with Charolotte Eke. Please review and advise.

## 2022-09-12 NOTE — Telephone Encounter (Signed)
done

## 2022-09-13 ENCOUNTER — Telehealth (HOSPITAL_BASED_OUTPATIENT_CLINIC_OR_DEPARTMENT_OTHER): Payer: BC Managed Care – PPO | Admitting: Medical

## 2022-09-13 ENCOUNTER — Encounter (HOSPITAL_COMMUNITY): Payer: Self-pay | Admitting: Medical

## 2022-09-13 DIAGNOSIS — F1121 Opioid dependence, in remission: Secondary | ICD-10-CM

## 2022-09-13 DIAGNOSIS — F4312 Post-traumatic stress disorder, chronic: Secondary | ICD-10-CM

## 2022-09-13 DIAGNOSIS — E669 Obesity, unspecified: Secondary | ICD-10-CM

## 2022-09-13 DIAGNOSIS — F1021 Alcohol dependence, in remission: Secondary | ICD-10-CM

## 2022-09-13 DIAGNOSIS — F32A Depression, unspecified: Secondary | ICD-10-CM | POA: Diagnosis not present

## 2022-09-13 DIAGNOSIS — F1421 Cocaine dependence, in remission: Secondary | ICD-10-CM

## 2022-09-13 DIAGNOSIS — F341 Dysthymic disorder: Secondary | ICD-10-CM

## 2022-09-13 DIAGNOSIS — F419 Anxiety disorder, unspecified: Secondary | ICD-10-CM | POA: Diagnosis not present

## 2022-09-13 DIAGNOSIS — T7492XS Unspecified child maltreatment, confirmed, sequela: Secondary | ICD-10-CM

## 2022-09-13 DIAGNOSIS — G4700 Insomnia, unspecified: Secondary | ICD-10-CM

## 2022-09-13 DIAGNOSIS — G4730 Sleep apnea, unspecified: Secondary | ICD-10-CM

## 2022-09-13 DIAGNOSIS — Z811 Family history of alcohol abuse and dependence: Secondary | ICD-10-CM

## 2022-09-13 DIAGNOSIS — Z79899 Other long term (current) drug therapy: Secondary | ICD-10-CM

## 2022-09-13 MED ORDER — ARIPIPRAZOLE 10 MG PO TABS: 10.0000 mg | ORAL_TABLET | Freq: Every day | ORAL | 1 refills | Status: DC

## 2022-09-13 MED ORDER — TRINTELLIX 20 MG PO TABS: ORAL_TABLET | ORAL | 0 refills | Status: DC

## 2022-09-13 MED ORDER — NALTREXONE HCL 50 MG PO TABS
50.0000 mg | ORAL_TABLET | Freq: Every day | ORAL | 1 refills | Status: DC
Start: 2022-09-13 — End: 2023-03-21

## 2022-09-13 MED ORDER — PREGABALIN 150 MG PO CAPS: 150.0000 mg | ORAL_CAPSULE | Freq: Three times a day (TID) | ORAL | 5 refills | Status: DC
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
## 2022-09-14 ENCOUNTER — Ambulatory Visit (HOSPITAL_COMMUNITY): Payer: BC Managed Care – PPO | Admitting: Licensed Clinical Social Worker

## 2022-09-19 DIAGNOSIS — G4733 Obstructive sleep apnea (adult) (pediatric): Secondary | ICD-10-CM | POA: Diagnosis not present

## 2022-09-21 ENCOUNTER — Ambulatory Visit (HOSPITAL_COMMUNITY): Payer: BC Managed Care – PPO | Admitting: Licensed Clinical Social Worker

## 2022-09-24 ENCOUNTER — Encounter (HOSPITAL_COMMUNITY): Payer: Self-pay | Admitting: Licensed Clinical Social Worker

## 2022-09-24 NOTE — Progress Notes (Signed)
  Virtual Visit via Video Note  I connected with Kevin Andrade on 08/31/22 at 11:00 am by a Video-enabled telemedicine application and verified that I am speaking with the correct person using two identifiers.   I discussed the limitations of evaluation and management by telemedicine and the availability of in person appointments. The patient expressed understanding and agreed to proceed.  LOCATION: Patient: Home Provider: Home Office  Therapy Goal addressed:  Pt will learn the importance of NA meetings in order to maintain sobriety AEB by 3 meeting attendance per week, working with sponsor 1x per week and completing step work.   History of Present Illness: Patient is referred for individual therapy after completion of CD-IOP for Opiod Use Disorder and hx of polysubstance abuse disorder, anxiety, depression. (Kratom & opiate pills)    Progress Toward Goal: Progressing    Observations/Objective: Patient presented for today's session on time and was alert, oriented x5, with no evidence or self-report of SI/HI or A/V H.  Patient reported ongoing compliance with medication and denied any use of alcohol or illicit substances.  Clinician inquired about patient's current emotional ratings, as well as any significant changes in thoughts, feelings or behavior since previous session.  Patient reported emotional ratings of 6/10 for depression, 5/10 for anxiety, 2/10 for anger/irritability. Cln and pt explored his emotional ratings and coping skills. today is the first day back for therapist who has been on FMLA. Pt provided an update on mental and physical health while therapist was out. Pt chose not to have an interim therapist but was given resources to use in case of crisis. Pt provided an update on his NA meetings, workbook, and letting go of  his sponsor. Cln used CBT to help pt with his decision to let go of his sponsor.        Collaboration of Care: Other: NA meetings and weekly sponsor  meetings    Patient/Guardian was advised Release of Information must be obtained prior to any record release in order to collaborate their care with an outside provider. Patient/Guardian was advised if they have not already done so to contact the registration department to sign all necessary forms in order for [place] to release information regarding their care.   Consent: Patient/Guardian gives verbal consent for treatment and assignment of benefits for services provided during this visit. Patient/Guardian expressed understanding and agreed to proceed.     Assessment and plan: Counselor will continue to meet with patient to address treatment plan goals. Patient will continue to follow recommendations of providers and implement skills learned in session.   Follow Up Instructions: I discussed the assessment and treatment plan with the patient. The patient was provided an opportunity to ask questions and all were answered. The patient agreed with the plan and demonstrated an understanding of the instructions.   The patient was advised to call back or seek an in-person evaluation if the symptoms worsen or if the condition fails to improve as anticipated.  I provided 45 minutes of non-face-to-face time during this encounter.   Cormac Wint S, LCAS

## 2022-09-28 ENCOUNTER — Ambulatory Visit (HOSPITAL_COMMUNITY): Payer: BC Managed Care – PPO | Admitting: Licensed Clinical Social Worker

## 2022-10-06 ENCOUNTER — Encounter (HOSPITAL_COMMUNITY): Payer: Self-pay | Admitting: Licensed Clinical Social Worker

## 2022-10-06 NOTE — Progress Notes (Signed)
  Virtual Visit via Video Note  I connected with Kevin Andrade on 09/07/22 at 11:00 am by a Video-enabled telemedicine application and verified that I am speaking with the correct person using two identifiers.   I discussed the limitations of evaluation and management by telemedicine and the availability of in person appointments. The patient expressed understanding and agreed to proceed.  LOCATION: Patient: Home Provider: Home Office  Therapy Goal addressed:  Pt will learn the importance of NA meetings in order to maintain sobriety AEB by 3 meeting attendance per week, working with sponsor 1x per week and completing step work.   History of Present Illness: Patient is referred for individual therapy after completion of CD-IOP for Opiod Use Disorder and hx of polysubstance abuse disorder, anxiety, depression. (Kratom & opiate pills)    Progress Toward Goal: Progressing    Observations/Objective: Patient presented for today's session on time and was alert, oriented x5, with no evidence or self-report of SI/HI or A/V H.  Patient reported ongoing compliance with medication and denied any use of alcohol or illicit substances.  Clinician inquired about patient's current emotional ratings, as well as any significant changes in thoughts, feelings or behavior since previous session.  Patient reported emotional ratings of 6/10 for depression, 5/10 for anxiety, 2/10 for anger/irritability. Cln and pt explored his emotional ratings and coping skills. Pt reports he dismissed his sponsor. Cln used CBT to assist patient with his decision. "I feel good but now I have to find a new one." Cln assisted pt with his stepwork since he is currently without a sponsor. Pt provided an update on his NA meetings, workbook.        Collaboration of Care: Other: NA meetings and weekly sponsor meetings    Patient/Guardian was advised Release of Information must be obtained prior to any record release in order  to collaborate their care with an outside provider. Patient/Guardian was advised if they have not already done so to contact the registration department to sign all necessary forms in order for [place] to release information regarding their care.   Consent: Patient/Guardian gives verbal consent for treatment and assignment of benefits for services provided during this visit. Patient/Guardian expressed understanding and agreed to proceed.     Assessment and plan: Counselor will continue to meet with patient to address treatment plan goals. Patient will continue to follow recommendations of providers and implement skills learned in session.   Follow Up Instructions: I discussed the assessment and treatment plan with the patient. The patient was provided an opportunity to ask questions and all were answered. The patient agreed with the plan and demonstrated an understanding of the instructions.   The patient was advised to call back or seek an in-person evaluation if the symptoms worsen or if the condition fails to improve as anticipated.  I provided 45 minutes of non-face-to-face time during this encounter.   Jenna Ardoin S, LCAS

## 2022-10-13 DIAGNOSIS — G4733 Obstructive sleep apnea (adult) (pediatric): Secondary | ICD-10-CM | POA: Diagnosis not present

## 2022-10-19 ENCOUNTER — Ambulatory Visit (HOSPITAL_COMMUNITY): Payer: BC Managed Care – PPO | Admitting: Licensed Clinical Social Worker

## 2022-10-26 ENCOUNTER — Ambulatory Visit (HOSPITAL_COMMUNITY): Payer: BC Managed Care – PPO | Admitting: Licensed Clinical Social Worker

## 2022-11-09 ENCOUNTER — Ambulatory Visit (HOSPITAL_COMMUNITY): Payer: BC Managed Care – PPO | Admitting: Licensed Clinical Social Worker

## 2022-11-11 DIAGNOSIS — G4733 Obstructive sleep apnea (adult) (pediatric): Secondary | ICD-10-CM | POA: Diagnosis not present

## 2022-11-16 ENCOUNTER — Ambulatory Visit (HOSPITAL_COMMUNITY): Payer: BC Managed Care – PPO | Admitting: Licensed Clinical Social Worker

## 2022-11-23 ENCOUNTER — Ambulatory Visit (HOSPITAL_COMMUNITY): Payer: BC Managed Care – PPO | Admitting: Licensed Clinical Social Worker

## 2022-11-30 ENCOUNTER — Ambulatory Visit (INDEPENDENT_AMBULATORY_CARE_PROVIDER_SITE_OTHER): Payer: BC Managed Care – PPO | Admitting: Licensed Clinical Social Worker

## 2022-11-30 DIAGNOSIS — F32A Depression, unspecified: Secondary | ICD-10-CM

## 2022-11-30 DIAGNOSIS — F1191 Opioid use, unspecified, in remission: Secondary | ICD-10-CM

## 2022-11-30 DIAGNOSIS — F419 Anxiety disorder, unspecified: Secondary | ICD-10-CM | POA: Diagnosis not present

## 2022-11-30 DIAGNOSIS — F1121 Opioid dependence, in remission: Secondary | ICD-10-CM

## 2022-12-07 ENCOUNTER — Ambulatory Visit (INDEPENDENT_AMBULATORY_CARE_PROVIDER_SITE_OTHER): Payer: BC Managed Care – PPO | Admitting: Licensed Clinical Social Worker

## 2022-12-07 DIAGNOSIS — F1191 Opioid use, unspecified, in remission: Secondary | ICD-10-CM | POA: Diagnosis not present

## 2022-12-07 DIAGNOSIS — F32A Depression, unspecified: Secondary | ICD-10-CM

## 2022-12-07 DIAGNOSIS — F419 Anxiety disorder, unspecified: Secondary | ICD-10-CM | POA: Diagnosis not present

## 2022-12-07 DIAGNOSIS — F1121 Opioid dependence, in remission: Secondary | ICD-10-CM

## 2022-12-12 DIAGNOSIS — G4733 Obstructive sleep apnea (adult) (pediatric): Secondary | ICD-10-CM | POA: Diagnosis not present

## 2022-12-14 ENCOUNTER — Ambulatory Visit (HOSPITAL_COMMUNITY): Payer: BC Managed Care – PPO | Admitting: Licensed Clinical Social Worker

## 2022-12-18 ENCOUNTER — Telehealth (HOSPITAL_COMMUNITY): Payer: Self-pay | Admitting: *Deleted

## 2022-12-18 NOTE — Telephone Encounter (Signed)
Pt called requesting refill pf the Pregabalin 150 mg caps. Pt advised that Rx was sent to pharmacy on 09/13/22 for #90 with 5 refills. Pt stated that he was told to call in 3 months for refill. Pharmacy confirms pt has refills left but currently too early to fill. Writer to notify pt of fill date 12/26/22. Pt also requested refill of the Trintellix 20 mg #90 sent on 09/03/22 with no refills. Pt has a f/u appointment scheduled on 03/14/23.

## 2022-12-19 ENCOUNTER — Other Ambulatory Visit (HOSPITAL_COMMUNITY): Payer: Self-pay | Admitting: *Deleted

## 2022-12-19 ENCOUNTER — Other Ambulatory Visit (HOSPITAL_COMMUNITY): Payer: Self-pay | Admitting: Medical

## 2022-12-19 MED ORDER — TRINTELLIX 20 MG PO TABS: ORAL_TABLET | ORAL | 0 refills | Status: DC

## 2022-12-19 NOTE — Telephone Encounter (Signed)
Thanks

## 2022-12-21 ENCOUNTER — Ambulatory Visit (INDEPENDENT_AMBULATORY_CARE_PROVIDER_SITE_OTHER): Payer: BC Managed Care – PPO | Admitting: Licensed Clinical Social Worker

## 2022-12-21 DIAGNOSIS — F32A Depression, unspecified: Secondary | ICD-10-CM

## 2022-12-21 DIAGNOSIS — F1121 Opioid dependence, in remission: Secondary | ICD-10-CM

## 2022-12-21 DIAGNOSIS — F419 Anxiety disorder, unspecified: Secondary | ICD-10-CM

## 2022-12-28 ENCOUNTER — Encounter (HOSPITAL_COMMUNITY): Payer: Self-pay | Admitting: Licensed Clinical Social Worker

## 2022-12-28 ENCOUNTER — Ambulatory Visit (INDEPENDENT_AMBULATORY_CARE_PROVIDER_SITE_OTHER): Payer: BC Managed Care – PPO | Admitting: Licensed Clinical Social Worker

## 2022-12-28 DIAGNOSIS — F1190 Opioid use, unspecified, uncomplicated: Secondary | ICD-10-CM

## 2022-12-28 DIAGNOSIS — F1121 Opioid dependence, in remission: Secondary | ICD-10-CM

## 2022-12-28 DIAGNOSIS — F419 Anxiety disorder, unspecified: Secondary | ICD-10-CM

## 2022-12-28 DIAGNOSIS — F32A Depression, unspecified: Secondary | ICD-10-CM | POA: Diagnosis not present

## 2022-12-28 NOTE — Progress Notes (Signed)
  Virtual Visit via Video Note  I connected with Kevin Andrade on 12/07/22 at 11:00 am by a Video-enabled telemedicine application and verified that I am speaking with the correct person using two identifiers.   I discussed the limitations of evaluation and management by telemedicine and the availability of in person appointments. The patient expressed understanding and agreed to proceed.  LOCATION: Patient: Home Provider: Home Office  Therapy Goal addressed:  Pt will learn the importance of NA meetings in order to maintain sobriety AEB by 3 meeting attendance per week, working with sponsor 1x per week and completing step work.   History of Present Illness: Patient is referred for individual therapy after completion of CD-IOP for Opiod Use Disorder and hx of polysubstance abuse disorder, anxiety, depression. (Kratom & opiate pills)    Progress Toward Goal: Progressing    Observations/Objective: Patient presented for today's session on time and was alert, oriented x5, with no evidence or self-report of SI/HI or A/V H.  Patient reported ongoing compliance with medication and denied any use of alcohol or illicit substances.  Clinician inquired about patient's current emotional ratings, as well as any significant changes in thoughts, feelings or behavior since previous session.  Patient reported emotional ratings of 6/10 for depression, 5/10 for anxiety, 2/10 for anger/irritability. Cln and pt explored his emotional ratings and coping skills. Pt reports he continues attending AA meeting on Fridays and NA meetings on Saturdays. Pt continues to review his NA workbook. Cln and pt reviewed the similarities and differences in the NA workbook. Cln and pt reviewed his thoughts and cravings for the week. "I have them daily." Cln described to the pt how to stop the cycle at his thoughts so it won't get to the cravings. Cln provided education on what may work to get out of the cycle. "I call several  men who are in my NA group."  Cln used CBT to assist patient with his concerns about his continued cravings.     Collaboration of Care: Other: NA meetings and weekly sponsor meetings    Patient/Guardian was advised Release of Information must be obtained prior to any record release in order to collaborate their care with an outside provider. Patient/Guardian was advised if they have not already done so to contact the registration department to sign all necessary forms in order for [place] to release information regarding their care.   Consent: Patient/Guardian gives verbal consent for treatment and assignment of benefits for services provided during this visit. Patient/Guardian expressed understanding and agreed to proceed.     Assessment and plan: Counselor will continue to meet with patient to address treatment plan goals. Patient will continue to follow recommendations of providers and implement skills learned in session.   Follow Up Instructions: I discussed the assessment and treatment plan with the patient. The patient was provided an opportunity to ask questions and all were answered. The patient agreed with the plan and demonstrated an understanding of the instructions.   The patient was advised to call back or seek an in-person evaluation if the symptoms worsen or if the condition fails to improve as anticipated.  I provided 45 minutes of non-face-to-face time during this encounter.   Geofrey Silliman S, LCAS

## 2022-12-28 NOTE — Progress Notes (Signed)
  Virtual Visit via Video Note  I connected with Kevin Andrade on 11/30/22 at 11:00 am by a Video-enabled telemedicine application and verified that I am speaking with the correct person using two identifiers.   I discussed the limitations of evaluation and management by telemedicine and the availability of in person appointments. The patient expressed understanding and agreed to proceed.  LOCATION: Patient: Home Provider: Home Office  Therapy Goal addressed:  Pt will learn the importance of NA meetings in order to maintain sobriety AEB by 3 meeting attendance per week, working with sponsor 1x per week and completing step work.   History of Present Illness: Patient is referred for individual therapy after completion of CD-IOP for Opiod Use Disorder and hx of polysubstance abuse disorder, anxiety, depression. (Kratom & opiate pills)    Progress Toward Goal: Progressing    Observations/Objective: Patient presented for today's session on time and was alert, oriented x5, with no evidence or self-report of SI/HI or A/V H.  Patient reported ongoing compliance with medication and denied any use of alcohol or illicit substances.  Clinician inquired about patient's current emotional ratings, as well as any significant changes in thoughts, feelings or behavior since previous session.  Patient reported emotional ratings of 6/10 for depression, 5/10 for anxiety, 2/10 for anger/irritability. Cln and pt explored his emotional ratings and coping skills. Pt reports he has been attending AA meeting on Fridays and has met a man who he would like to be his sponsor.dismissed his sponsor. Cln provided education on AA vs NA. Cln and pt reviewed his NA steps vs AA steps. Cln reviewed the NA workbook in session and reminded pt he will start over with the steps. Cln explained the new AA sponsor may intertwine both sets of steps. Pt explained his fears and concerns about starting the steps over. Cln used CBT to  assist patient with his fears and concerns.      Collaboration of Care: Other: NA meetings and weekly sponsor meetings    Patient/Guardian was advised Release of Information must be obtained prior to any record release in order to collaborate their care with an outside provider. Patient/Guardian was advised if they have not already done so to contact the registration department to sign all necessary forms in order for [place] to release information regarding their care.   Consent: Patient/Guardian gives verbal consent for treatment and assignment of benefits for services provided during this visit. Patient/Guardian expressed understanding and agreed to proceed.     Assessment and plan: Counselor will continue to meet with patient to address treatment plan goals. Patient will continue to follow recommendations of providers and implement skills learned in session.   Follow Up Instructions: I discussed the assessment and treatment plan with the patient. The patient was provided an opportunity to ask questions and all were answered. The patient agreed with the plan and demonstrated an understanding of the instructions.   The patient was advised to call back or seek an in-person evaluation if the symptoms worsen or if the condition fails to improve as anticipated.  I provided 45 minutes of non-face-to-face time during this encounter.   Jaylyne Breese S, LCAS

## 2023-01-04 ENCOUNTER — Ambulatory Visit (HOSPITAL_COMMUNITY): Payer: BC Managed Care – PPO | Admitting: Licensed Clinical Social Worker

## 2023-01-08 ENCOUNTER — Encounter (HOSPITAL_COMMUNITY): Payer: Self-pay | Admitting: Licensed Clinical Social Worker

## 2023-01-08 NOTE — Progress Notes (Signed)
  Virtual Visit via Video Note  I connected with Kevin Andrade on 12/21/22 at 11:00 am by a Video-enabled telemedicine application and verified that I am speaking with the correct person using two identifiers.   I discussed the limitations of evaluation and management by telemedicine and the availability of in person appointments. The patient expressed understanding and agreed to proceed.  LOCATION: Patient: Home Provider: Home Office  Therapy Goal addressed:  Pt will learn the importance of NA meetings in order to maintain sobriety AEB by 3 meeting attendance per week, working with sponsor 1x per week and completing step work.   History of Present Illness: Patient is referred for individual therapy after completion of CD-IOP for Opiod Use Disorder and hx of polysubstance abuse disorder, anxiety, depression. (Kratom & opiate pills)    Progress Toward Goal: Progressing    Observations/Objective: Patient presented for today's session on time and was alert, oriented x5, with no evidence or self-report of SI/HI or A/V H.  Patient reported ongoing compliance with medication and denied any use of alcohol or illicit substances.  Clinician inquired about patient's current emotional ratings, as well as any significant changes in thoughts, feelings or behavior since previous session.  Patient reported emotional ratings of 4/10 for depression, 4/10 for anxiety, 2/10 for anger/irritability. Cln and pt explored his emotional ratings and coping skills. Pt reports he continues attending AA meeting on Fridays and NA meetings on Saturdays. Pt continues to review his NA workbook. Cln and pt reviewed the similarities and differences in the NA workbook. Cln and pt reviewed his thoughts and cravings for the week. "I have them daily." Cln described to the pt reviewed how to stop the cycle at his thoughts so it won't get to the cravings. Cln provided education on what may work to get out of the cycle. "I call  several men who are in my NA group."  Cln used CBT to assist patient with his concerns about his continued cravings.     Collaboration of Care: Other: NA meetings and weekly sponsor meetings    Patient/Guardian was advised Release of Information must be obtained prior to any record release in order to collaborate their care with an outside provider. Patient/Guardian was advised if they have not already done so to contact the registration department to sign all necessary forms in order for [place] to release information regarding their care.   Consent: Patient/Guardian gives verbal consent for treatment and assignment of benefits for services provided during this visit. Patient/Guardian expressed understanding and agreed to proceed.     Assessment and plan: Counselor will continue to meet with patient to address treatment plan goals. Patient will continue to follow recommendations of providers and implement skills learned in session.   Follow Up Instructions: I discussed the assessment and treatment plan with the patient. The patient was provided an opportunity to ask questions and all were answered. The patient agreed with the plan and demonstrated an understanding of the instructions.   The patient was advised to call back or seek an in-person evaluation if the symptoms worsen or if the condition fails to improve as anticipated.  I provided 45 minutes of non-face-to-face time during this encounter.   Nakeita Styles S, LCAS

## 2023-01-11 DIAGNOSIS — G4733 Obstructive sleep apnea (adult) (pediatric): Secondary | ICD-10-CM | POA: Diagnosis not present

## 2023-01-12 ENCOUNTER — Encounter (HOSPITAL_COMMUNITY): Payer: Self-pay | Admitting: Licensed Clinical Social Worker

## 2023-01-12 NOTE — Progress Notes (Signed)
  Virtual Visit via Video Note  I connected with Kevin Andrade on 12/28/22 at 11:00 am by a Video-enabled telemedicine application and verified that I am speaking with the correct person using two identifiers.   I discussed the limitations of evaluation and management by telemedicine and the availability of in person appointments. The patient expressed understanding and agreed to proceed.  LOCATION: Patient: Home Provider: Home Office  Therapy Goal addressed:  Pt will learn the importance of NA meetings in order to maintain sobriety AEB by 3 meeting attendance per week, working with sponsor 1x per week and completing step work.   History of Present Illness: Patient is referred for individual therapy after completion of CD-IOP for Opiod Use Disorder and hx of polysubstance abuse disorder, anxiety, depression. (Kratom & opiate pills)    Progress Toward Goal: Progressing    Observations/Objective: Patient presented for today's session on time and was alert, oriented x5, with no evidence or self-report of SI/HI or A/V H.  Patient reported ongoing compliance with medication and denied any use of alcohol or illicit substances.  Clinician inquired about patient's current emotional ratings, as well as any significant changes in thoughts, feelings or behavior since previous session.  Patient reported emotional ratings of 4/10 for depression, 4/10 for anxiety, 2/10 for anger/irritability. Cln and pt explored his emotional ratings and coping skills. Pt reports he continues attending AA meeting on Fridays and NA meetings on Saturdays. Pt continues to review his NA workbook. Pt is still looking for a sponsor and may chose a sponsor in [place]. Pt reports, " I still continue to have thoughts and cravings daily.".Cln and pt reviewed his thoughts and cravings for the past week and coping skills. Cln educated the pt reviewed on stopping the cycle of his thoughts so it won't get to the cravings. Cln provided  education on what may work to get out of the cycle. "I call several men who are in my NA group."  Cln used CBT to assist patient with his concerns about his continued cravings.     Collaboration of Care: Other: NA meetings and weekly sponsor meetings    Patient/Guardian was advised Release of Information must be obtained prior to any record release in order to collaborate their care with an outside provider. Patient/Guardian was advised if they have not already done so to contact the registration department to sign all necessary forms in order for [place] to release information regarding their care.   Consent: Patient/Guardian gives verbal consent for treatment and assignment of benefits for services provided during this visit. Patient/Guardian expressed understanding and agreed to proceed.     Assessment and plan: Counselor will continue to meet with patient to address treatment plan goals. Patient will continue to follow recommendations of providers and implement skills learned in session.   Follow Up Instructions: I discussed the assessment and treatment plan with the patient. The patient was provided an opportunity to ask questions and all were answered. The patient agreed with the plan and demonstrated an understanding of the instructions.   The patient was advised to call back or seek an in-person evaluation if the symptoms worsen or if the condition fails to improve as anticipated.  I provided 25 minutes of non-face-to-face time during this encounter.   Emeline Simpson S, LCAS

## 2023-01-15 ENCOUNTER — Emergency Department (HOSPITAL_COMMUNITY)
Admission: EM | Admit: 2023-01-15 | Discharge: 2023-01-15 | Disposition: A | Discharge status: Home / Self Care | Payer: Worker's Compensation | Attending: Emergency Medicine | Admitting: Emergency Medicine

## 2023-01-15 ENCOUNTER — Other Ambulatory Visit: Payer: Self-pay

## 2023-01-15 ENCOUNTER — Emergency Department (HOSPITAL_COMMUNITY): Payer: Worker's Compensation

## 2023-01-15 DIAGNOSIS — M542 Cervicalgia: Secondary | ICD-10-CM | POA: Diagnosis not present

## 2023-01-15 DIAGNOSIS — R001 Bradycardia, unspecified: Secondary | ICD-10-CM | POA: Diagnosis not present

## 2023-01-15 DIAGNOSIS — M546 Pain in thoracic spine: Secondary | ICD-10-CM | POA: Insufficient documentation

## 2023-01-15 DIAGNOSIS — Y92410 Unspecified street and highway as the place of occurrence of the external cause: Secondary | ICD-10-CM | POA: Diagnosis not present

## 2023-01-15 DIAGNOSIS — E161 Other hypoglycemia: Secondary | ICD-10-CM | POA: Diagnosis not present

## 2023-01-15 MED ORDER — IBUPROFEN 400 MG PO TABS
600.0000 mg | ORAL_TABLET | Freq: Once | ORAL | Status: AC
  Administered 2023-01-15: 600 mg via ORAL
  Filled 2023-01-15: qty 1

## 2023-01-15 MED ORDER — IBUPROFEN 600 MG PO TABS: 600.0000 mg | ORAL_TABLET | Freq: Four times a day (QID) | ORAL | 0 refills | Status: AC | PRN

## 2023-01-15 MED ORDER — ACETAMINOPHEN 325 MG PO TABS
650.0000 mg | ORAL_TABLET | Freq: Once | ORAL | Status: AC
  Administered 2023-01-15: 650 mg via ORAL
  Filled 2023-01-15: qty 2

## 2023-01-15 MED ORDER — CYCLOBENZAPRINE HCL 10 MG PO TABS: 10.0000 mg | ORAL_TABLET | Freq: Two times a day (BID) | ORAL | 0 refills | Status: DC | PRN

## 2023-01-15 MED ORDER — CYCLOBENZAPRINE HCL 10 MG PO TABS
10.0000 mg | ORAL_TABLET | Freq: Once | ORAL | Status: AC
  Administered 2023-01-15: 10 mg via ORAL
  Filled 2023-01-15: qty 1
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
## 2023-01-15 NOTE — Discharge Instructions (Addendum)
We do not see any fractures or broken bones on your x-rays today.  We discussed we will likely have sore muscles beginning tomorrow for at least 1 to 2 weeks.  Keep yourself well-hydrated at home.  Use heating packs on your muscles and back at home.  If you begin having worsening chest pain, particularly with lightheadedness, sweating, tearing pain into your back, numbness or weakness of your arms or legs, please return to the emergency department immediately.  These may be signs of a more serious condition that needs medical attention.

## 2023-01-23 ENCOUNTER — Ambulatory Visit: Payer: BC Managed Care – PPO | Admitting: Family Medicine

## 2023-01-23 VITALS — BP 116/78 | HR 60 | Temp 98.1°F | Wt 233.0 lb

## 2023-01-23 DIAGNOSIS — S46812A Strain of other muscles, fascia and tendons at shoulder and upper arm level, left arm, initial encounter: Secondary | ICD-10-CM

## 2023-01-23 DIAGNOSIS — M7918 Myalgia, other site: Secondary | ICD-10-CM

## 2023-01-23 DIAGNOSIS — M25512 Pain in left shoulder: Secondary | ICD-10-CM

## 2023-01-23 DIAGNOSIS — M898X1 Other specified disorders of bone, shoulder: Secondary | ICD-10-CM

## 2023-01-23 DIAGNOSIS — M62838 Other muscle spasm: Secondary | ICD-10-CM
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
## 2023-02-04 ENCOUNTER — Encounter: Payer: Self-pay | Admitting: Family Medicine

## 2023-02-11 DIAGNOSIS — G4733 Obstructive sleep apnea (adult) (pediatric): Secondary | ICD-10-CM | POA: Diagnosis not present

## 2023-03-13 DIAGNOSIS — G4733 Obstructive sleep apnea (adult) (pediatric): Secondary | ICD-10-CM | POA: Diagnosis not present

## 2023-03-14 ENCOUNTER — Telehealth (HOSPITAL_COMMUNITY): Payer: BC Managed Care – PPO | Admitting: Medical

## 2023-03-17 ENCOUNTER — Other Ambulatory Visit (HOSPITAL_COMMUNITY): Payer: Self-pay | Admitting: Medical

## 2023-03-20 DIAGNOSIS — G4733 Obstructive sleep apnea (adult) (pediatric): Secondary | ICD-10-CM | POA: Diagnosis not present

## 2023-03-21 ENCOUNTER — Encounter (HOSPITAL_COMMUNITY): Payer: Self-pay | Admitting: Medical

## 2023-03-21 ENCOUNTER — Telehealth (HOSPITAL_BASED_OUTPATIENT_CLINIC_OR_DEPARTMENT_OTHER): Payer: BC Managed Care – PPO | Admitting: Medical

## 2023-03-21 DIAGNOSIS — F4312 Post-traumatic stress disorder, chronic: Secondary | ICD-10-CM

## 2023-03-21 DIAGNOSIS — F341 Dysthymic disorder: Secondary | ICD-10-CM

## 2023-03-21 DIAGNOSIS — T7492XS Unspecified child maltreatment, confirmed, sequela: Secondary | ICD-10-CM

## 2023-03-21 DIAGNOSIS — E669 Obesity, unspecified: Secondary | ICD-10-CM

## 2023-03-21 DIAGNOSIS — F419 Anxiety disorder, unspecified: Secondary | ICD-10-CM | POA: Diagnosis not present

## 2023-03-21 DIAGNOSIS — F32A Depression, unspecified: Secondary | ICD-10-CM

## 2023-03-21 DIAGNOSIS — Z79899 Other long term (current) drug therapy: Secondary | ICD-10-CM

## 2023-03-21 DIAGNOSIS — F1121 Opioid dependence, in remission: Secondary | ICD-10-CM | POA: Diagnosis not present

## 2023-03-21 DIAGNOSIS — Z811 Family history of alcohol abuse and dependence: Secondary | ICD-10-CM

## 2023-03-21 DIAGNOSIS — F1421 Cocaine dependence, in remission: Secondary | ICD-10-CM

## 2023-03-21 DIAGNOSIS — F1021 Alcohol dependence, in remission: Secondary | ICD-10-CM

## 2023-03-21 DIAGNOSIS — G4700 Insomnia, unspecified: Secondary | ICD-10-CM

## 2023-03-21 MED ORDER — ARIPIPRAZOLE 10 MG PO TABS: 10.0000 mg | ORAL_TABLET | Freq: Every day | ORAL | 1 refills | Status: DC

## 2023-03-21 MED ORDER — NALTREXONE HCL 50 MG PO TABS: 50.0000 mg | ORAL_TABLET | Freq: Every day | ORAL | 1 refills | Status: DC

## 2023-03-21 MED ORDER — PREGABALIN 150 MG PO CAPS: 150.0000 mg | ORAL_CAPSULE | Freq: Three times a day (TID) | ORAL | 5 refills | Status: DC

## 2023-03-21 MED ORDER — TRINTELLIX 20 MG PO TABS: ORAL_TABLET | ORAL | 0 refills | Status: DC

## 2023-03-21 NOTE — Telephone Encounter (Signed)
Pt seen today refills sent
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
## 2023-05-03 ENCOUNTER — Encounter: Payer: Self-pay | Admitting: Family Medicine

## 2023-05-03 ENCOUNTER — Ambulatory Visit (INDEPENDENT_AMBULATORY_CARE_PROVIDER_SITE_OTHER): Payer: BC Managed Care – PPO | Admitting: Family Medicine

## 2023-05-03 VITALS — BP 124/82 | HR 66 | Temp 97.8°F | Ht 73.0 in | Wt 234.8 lb

## 2023-05-03 DIAGNOSIS — Z1211 Encounter for screening for malignant neoplasm of colon: Secondary | ICD-10-CM

## 2023-05-03 DIAGNOSIS — J0140 Acute pansinusitis, unspecified: Secondary | ICD-10-CM | POA: Diagnosis not present

## 2023-05-03 DIAGNOSIS — J40 Bronchitis, not specified as acute or chronic: Secondary | ICD-10-CM

## 2023-05-03 DIAGNOSIS — J3489 Other specified disorders of nose and nasal sinuses: Secondary | ICD-10-CM | POA: Diagnosis not present

## 2023-05-03 LAB — POC COVID19 BINAXNOW: SARS Coronavirus 2 Ag: NEGATIVE

## 2023-05-03 MED ORDER — AMOXICILLIN-POT CLAVULANATE 500-125 MG PO TABS
1.0000 | ORAL_TABLET | Freq: Two times a day (BID) | ORAL | 0 refills | Status: AC
Start: 2023-05-03 — End: 2023-05-10

## 2023-05-03 NOTE — Progress Notes (Signed)
Established Patient Office Visit   Subjective  Patient ID: Kevin Andrade, male    DOB: Sep 25, 1970  Age: 52 y.o. MRN: [id number]  Chief Complaint  Patient presents with   Cough    Cough and runny nose with mucus, started a week ago    Patient is a 52 year old male seen for acute concern.  Patient endorses productive cough and rhinorrhea x 1 week.  Also notes wheezing which has improved some since making appointment.  Denies fever, chills, headache, ear pain/pressure, sore throat, N/V.  Sick contacts include patient's wife who had bronchitis.  Patient requesting new referral for colonoscopy.  States would like it to go to the same place he was referred to last year as that is where his wife goes.  Cough    Past Medical History:  Diagnosis Date   Anxiety    Hypersomnia, persistent    Insomnia with sleep apnea    Sleep apnea    Substance abuse ([name])    Tobacco use disorder    Past Surgical History:  Procedure Laterality Date   AMPUTATION Right 12/18/2018   Procedure: RIGHT DISTAL PHALANX INDEX FINGER AMPUTATION;  Surgeon: Betha Loa, MD;  Location:  SUR[facility]ERY CENTER;  Service: Orthopedics;  Laterality: Right;   KNEE SURGERY Left    Social History   Tobacco Use   Smoking status: Former    Current packs/day: 0.00    Average packs/day: 2.0 packs/day for 20.0 years (40.0 ttl pk-yrs)    Types: Cigarettes    Start date: 01/04/2002    Quit date: 01/04/2022    Years since quitting: 1.3   Smokeless tobacco: Former  Building services engineer status: Never Used  Substance Use Topics   Alcohol use: No   Drug use: No   Family History  Problem Relation Age of Onset   Diabetes Mother    Arthritis Mother    Cancer Father        lymphoma   Hyperlipidemia Father    Alcohol abuse Father    Arthritis Father    Depression Father    Diabetes Father    Stroke Father    No Known Allergies    Review of Systems  Respiratory:  Positive for cough.    Negative unless  stated above    Objective:     BP 124/82 (BP Location: Right Arm, Patient Position: Sitting, Cuff Size: Normal)   Pulse 66   Temp 97.8 F (36.6 C) (Oral)   Ht 6\' 1"  (1.854 m)   Wt 234 lb 12.8 oz (106.5 kg)   SpO2 97%   BMI 30.98 kg/m  BP Readings from Last 3 Encounters:  05/03/23 124/82  01/23/23 116/78  01/15/23 120/73   Wt Readings from Last 3 Encounters:  05/03/23 234 lb 12.8 oz (106.5 kg)  01/23/23 233 lb (105.7 kg)  01/15/23 223 lb (101.2 kg)      Physical Exam Constitutional:      General: He is not in acute distress.    Appearance: Normal appearance.  HENT:     Head: Normocephalic and atraumatic.     Nose:     Right Sinus: Maxillary sinus tenderness and frontal sinus tenderness present.     Left Sinus: Maxillary sinus tenderness and frontal sinus tenderness present.     Mouth/Throat:     Mouth: Mucous membranes are moist.  Cardiovascular:     Rate and Rhythm: Normal rate and regular rhythm.     Heart sounds: Normal heart  sounds. No murmur heard.    No gallop.  Pulmonary:     Effort: Pulmonary effort is normal. No respiratory distress.     Breath sounds: Examination of the left-lower field reveals wheezing. Wheezing present. No rhonchi or rales.  Skin:    General: Skin is warm and dry.  Neurological:     Mental Status: He is alert and oriented to person, place, and time.      Results for orders placed or performed in visit on 05/03/23  POC COVID-19  Result Value Ref Range   SARS Coronavirus 2 Ag Negative Negative      Assessment & Plan:  Acute pansinusitis, recurrence not specified -     Amoxicillin-Pot Clavulanate; Take 1 tablet by mouth in the morning and at bedtime for 7 days.  Dispense: 14 tablet; Refill: 0  Bronchitis  Screen for colon cancer -     Ambulatory referral to Gastroenterology  Rhinorrhea -     POC COVID-19 BinaxNow  Acute URI symptoms.  COVID testing negative.  Start ABX for acute pansinusitis.  Bronchitis/cough symptoms  improving.  Offered inhaler and prednisone burst.  Patient declines at this time.  Continue supportive care with OTC medications.  Referral for colonoscopy placed.  Return if symptoms worsen or fail to improve.   Deeann Saint, MD

## 2023-05-09 ENCOUNTER — Encounter: Payer: Self-pay | Admitting: Internal Medicine

## 2023-05-24 ENCOUNTER — Ambulatory Visit (AMBULATORY_SURGERY_CENTER): Payer: BC Managed Care – PPO

## 2023-05-24 VITALS — Ht 73.0 in | Wt 235.0 lb

## 2023-05-24 DIAGNOSIS — Z1211 Encounter for screening for malignant neoplasm of colon: Secondary | ICD-10-CM

## 2023-05-24 NOTE — Progress Notes (Signed)

## 2023-05-29 ENCOUNTER — Encounter: Payer: Self-pay | Admitting: Internal Medicine

## 2023-05-31 ENCOUNTER — Other Ambulatory Visit: Payer: Self-pay | Admitting: Family Medicine

## 2023-05-31 DIAGNOSIS — Z1212 Encounter for screening for malignant neoplasm of rectum: Secondary | ICD-10-CM

## 2023-05-31 DIAGNOSIS — Z1211 Encounter for screening for malignant neoplasm of colon: Secondary | ICD-10-CM

## 2023-06-11 NOTE — Progress Notes (Unsigned)
[facility] [address] [place]. [place] [address] [phone]       OFFICE FOLLOW UP NOTE  Mr. Kevin Andrade Date of Birth:  04/12/1971 Medical Record Number:  [id number]    Primary neurologist: Dr. Vickey Huger Reason for visit: CPAP follow-up    SUBJECTIVE:   CHIEF COMPLAINT:  No chief complaint on file.   Follow-up visit:  Prior visit: 06/11/2022 with Dr. Vickey Huger  Brief HPI:   Kevin Andrade is a 52 y.o. male who was evaluated by Dr. Vickey Huger on 02/14/2022 for concern of underlying sleep apnea with complaints of daytime fatigue, insomnia, and witnessed apneas.  ESS 20/24. FSS 61/63.  HST 03/27/2022 showed mild to moderate sleep apnea with total AHI 15.5/h and REM AHI 34/h.  AutoPap set up date 04/12/2022.  At prior visit, compliance report noted satisfactory compliance and optimal residual AHI.  ESS 9/24    Interval history:        ROS:   14 system review of systems performed and negative with exception of those listed in HPI  PMH:  Past Medical History:  Diagnosis Date   Anxiety    Hypersomnia, persistent    Insomnia with sleep apnea    Sleep apnea    Substance abuse ([name])    Tobacco use disorder     PSH:  Past Surgical History:  Procedure Laterality Date   AMPUTATION Right 12/18/2018   Procedure: RIGHT DISTAL PHALANX INDEX FINGER AMPUTATION;  Surgeon: Betha Loa, MD;  Location: [facility];  Service: Orthopedics;  Laterality: Right;   KNEE SURGERY Left     Social History:  Social History   Socioeconomic History   Marital status: Married    Spouse name: [name]   Number of children: 2   Years of education: HS   Highest education level: GED or equivalent  Occupational History   Not on file  Tobacco Use   Smoking status: Former    Current packs/day: 0.00    Average packs/day: 2.0 packs/day for 20.0 years (40.0 ttl pk-yrs)    Types: Cigarettes    Start date: 01/04/2002    Quit date: 01/04/2022     Years since quitting: 1.4   Smokeless tobacco: Former  Building services engineer status: Never Used  Substance and Sexual Activity   Alcohol use: No   Drug use: No   Sexual activity: Yes  Other Topics Concern   Not on file  Social History Narrative   Patient lives at home with spouse.   Caffeine Use: 2 L daily   Social Determinants of Health   Financial Resource Strain: Low Risk  (01/22/2023)   Overall Financial Resource Strain (CARDIA)    Difficulty of Paying Living Expenses: Not hard at all  Food Insecurity: No Food Insecurity (01/22/2023)   Hunger Vital Sign    Worried About Running Out of Food in the Last Year: Never true    Ran Out of Food in the Last Year: Never true  Transportation Needs: No Transportation Needs (01/22/2023)   PRAPARE - Administrator, Civil Service (Medical): No    Lack of Transportation (Non-Medical): No  Physical Activity: Sufficiently Active (01/22/2023)   Exercise Vital Sign    Days of Exercise per Week: 5 days    Minutes of Exercise per Session: 50 min  Stress: Stress Concern Present (01/22/2023)   [employer] of Occupational Health - Occupational Stress Questionnaire    Feeling of Stress : To some extent  Social Connections: Socially Isolated (01/22/2023)   Social Connection and Isolation Panel [NHANES]    Frequency of Communication with Friends and Family: Once a week    Frequency of Social Gatherings with Friends and Family: Never    Attends Religious Services: Never    Diplomatic Services operational officer: No    Attends Engineer, structural: Not on file    Marital Status: Married  Catering manager Violence: Not on file    Family History:  Family History  Problem Relation Age of Onset   Diabetes Mother    Arthritis Mother    Cancer Father        lymphoma   Hyperlipidemia Father    Alcohol abuse Father    Arthritis Father    Depression Father    Diabetes Father    Stroke Father    Colon cancer Neg Hx     Esophageal cancer Neg Hx    Rectal cancer Neg Hx    Stomach cancer Neg Hx    Colon polyps Neg Hx     Medications:   Current Outpatient Medications on File Prior to Visit  Medication Sig Dispense Refill   ARIPiprazole (ABILIFY) 10 MG tablet Take 1 tablet (10 mg total) by mouth daily. 90 tablet 1   cyclobenzaprine (FLEXERIL) 10 MG tablet Take 1 tablet (10 mg total) by mouth 2 (two) times daily as needed for up to 14 doses for muscle spasms. (Patient not taking: Reported on 05/24/2023) 14 tablet 0   ibuprofen (ADVIL) 200 MG tablet Take 200 mg by mouth every 6 (six) hours as needed.     ibuprofen (ADVIL) 600 MG tablet Take 1 tablet (600 mg total) by mouth every 6 (six) hours as needed for up to 30 doses for mild pain or moderate pain. 30 tablet 0   naltrexone (DEPADE) 50 MG tablet Take 1 tablet (50 mg total) by mouth daily. 90 tablet 1   pregabalin (LYRICA) 150 MG capsule Take 1 capsule (150 mg total) by mouth in the morning, at noon, and at bedtime. 90 capsule 5   TRINTELLIX 20 MG TABS tablet TAKE 1 TABLET BY MOUTH EVERY DAY 90 tablet 0   varenicline ([name]) 1 MG tablet Take 1 tablet (1 mg total) by mouth 2 (two) times daily. 180 tablet 1   No current facility-administered medications on file prior to visit.    Allergies:  No Known Allergies    OBJECTIVE:  Physical Exam  There were no vitals filed for this visit. There is no height or weight on file to calculate BMI. No results found.   General: well developed, well nourished, seated, in no evident distress Head: head normocephalic and atraumatic.   Neck: supple with no carotid or supraclavicular bruits Cardiovascular: regular rate and rhythm, no murmurs Musculoskeletal: no deformity Skin:  no rash/petichiae Vascular:  Normal pulses all extremities   Neurologic Exam Mental Status: Awake and fully alert. Oriented to place and time. Recent and remote memory intact. Attention span, concentration and fund of knowledge  appropriate. Mood and affect appropriate.  Cranial Nerves: Pupils equal, briskly reactive to light. Extraocular movements full without nystagmus. Visual fields full to confrontation. Hearing intact. Facial sensation intact. Face, tongue, palate moves normally and symmetrically.  Motor: Normal bulk and tone. Normal strength in all tested extremity muscles Sensory.: intact to touch , pinprick , position and vibratory sensation.  Coordination: Rapid alternating movements normal in all extremities. Finger-to-nose and heel-to-shin performed accurately bilaterally. Gait and Station: [employer]  from chair without difficulty. Stance is normal. Gait demonstrates normal stride length and balance without use of AD. Tandem walk and heel toe without difficulty.  Reflexes: 1+ and symmetric. Toes downgoing.         ASSESSMENT/PLAN: Kevin Andrade is a 52 y.o. year old male    OSA on CPAP : Compliance report shows satisfactory usage with optimal residual AHI.  Discussed continued nightly usage with ensuring greater than 4 hours nightly for optimal benefit and per insurance purposes.  Continue to follow with DME company for any needed supplies or CPAP related concerns     Follow up in *** or call earlier if needed   CC:  PCP: Deeann Saint, MD    I spent *** minutes of face-to-face and non-face-to-face time with patient.  This included previsit chart review, lab review, study review, order entry, electronic health record documentation, patient education and discussion regarding above diagnoses and treatment plan and answered all other questions to patient's satisfaction    Ihor Austin, [facility]  [facility] [address] [place], [place] [address]  Phone [phone] Fax [phone] Note: This document was prepared with digital dictation and possible smart phrase technology. Any transcriptional errors that result from this process are  unintentional.

## 2023-06-12 ENCOUNTER — Encounter: Payer: Self-pay | Admitting: Adult Health

## 2023-06-12 ENCOUNTER — Ambulatory Visit (INDEPENDENT_AMBULATORY_CARE_PROVIDER_SITE_OTHER): Payer: BC Managed Care – PPO | Admitting: Adult Health

## 2023-06-12 VITALS — BP 106/60 | HR 58 | Ht 73.0 in | Wt 240.0 lb

## 2023-06-12 DIAGNOSIS — G4733 Obstructive sleep apnea (adult) (pediatric): Secondary | ICD-10-CM

## 2023-06-12 NOTE — Patient Instructions (Addendum)
Your Plan:  Your CPAP compliance report looks great and your apnea is being well managed!   Continue nightly use of CPAP for adequate sleep apnea management  Continue to follow with your DME company [employer] for any needed supplies or CPAP related concerns  If your leaks start to become bothersome and interested in trying a different mask, please let me know!    Follow-up in 1 year via MyChart video visit or call earlier if needed     Thank you for coming to see [place] at [facility]. I hope we have been able to provide you high quality care today.  You may receive a patient satisfaction survey over the next few weeks. We would appreciate your feedback and comments so that we may continue to improve ourselves and the health of our patients.

## 2023-06-13 NOTE — Progress Notes (Signed)
[facility] History and Physical   Primary Care Physician:  Deeann Saint, MD   Reason for Procedure:  Colon cancer screening  Plan:    Colonoscopy     HPI: Kevin Andrade is a 52 y.o. male presenting for an initial screening colonoscopy.   Past Medical History:  Diagnosis Date   Anxiety    Hypersomnia, persistent    Insomnia with sleep apnea    Sleep apnea    Substance abuse ([name])    Tobacco use disorder     Past Surgical History:  Procedure Laterality Date   AMPUTATION Right 12/18/2018   Procedure: RIGHT DISTAL PHALANX INDEX FINGER AMPUTATION;  Surgeon: Betha Loa, MD;  Location: [facility];  Service: Orthopedics;  Laterality: Right;   KNEE SURGERY Left     Prior to Admission medications   Medication Sig Start Date End Date Taking? Authorizing Provider  ARIPiprazole (ABILIFY) 10 MG tablet Take 1 tablet (10 mg total) by mouth daily. 03/21/23   Court Joy, PA-C  ibuprofen (ADVIL) 200 MG tablet Take 200 mg by mouth every 6 (six) hours as needed.    [provider]  ibuprofen (ADVIL) 600 MG tablet Take 1 tablet (600 mg total) by mouth every 6 (six) hours as needed for up to 30 doses for mild pain or moderate pain. 01/15/23   Terald Sleeper, MD  naltrexone (DEPADE) 50 MG tablet Take 1 tablet (50 mg total) by mouth daily. 03/21/23 03/15/24  Court Joy, PA-C  pregabalin (LYRICA) 150 MG capsule Take 1 capsule (150 mg total) by mouth in the morning, at noon, and at bedtime. 03/21/23 09/17/23  Court Joy, PA-C  TRINTELLIX 20 MG TABS tablet TAKE 1 TABLET BY MOUTH EVERY DAY 03/21/23   Court Joy, PA-C  varenicline ([name]) 1 MG tablet Take 1 tablet (1 mg total) by mouth 2 (two) times daily. 02/28/22   Deeann Saint, MD    Current Outpatient Medications  Medication Sig Dispense Refill   ARIPiprazole (ABILIFY) 10 MG tablet Take 1 tablet (10 mg total) by mouth daily. 90 tablet 1   ibuprofen (ADVIL) 200 MG tablet  Take 200 mg by mouth every 6 (six) hours as needed.     ibuprofen (ADVIL) 600 MG tablet Take 1 tablet (600 mg total) by mouth every 6 (six) hours as needed for up to 30 doses for mild pain or moderate pain. 30 tablet 0   naltrexone (DEPADE) 50 MG tablet Take 1 tablet (50 mg total) by mouth daily. 90 tablet 1   pregabalin (LYRICA) 150 MG capsule Take 1 capsule (150 mg total) by mouth in the morning, at noon, and at bedtime. 90 capsule 5   TRINTELLIX 20 MG TABS tablet TAKE 1 TABLET BY MOUTH EVERY DAY 90 tablet 0   varenicline ([name]) 1 MG tablet Take 1 tablet (1 mg total) by mouth 2 (two) times daily. 180 tablet 1   No current facility-administered medications for this visit.    Allergies as of 06/14/2023   (No Known Allergies)    Family History  Problem Relation Age of Onset   Diabetes Mother    Arthritis Mother    Cancer Father        lymphoma   Hyperlipidemia Father    Alcohol abuse Father    Arthritis Father    Depression Father    Diabetes Father    Stroke Father    Colon cancer Neg Hx    Esophageal cancer  Neg Hx    Rectal cancer Neg Hx    Stomach cancer Neg Hx    Colon polyps Neg Hx     Social History   Socioeconomic History   Marital status: Married    Spouse name: [name]   Number of children: 2   Years of education: HS   Highest education level: GED or equivalent  Occupational History   Not on file  Tobacco Use   Smoking status: Former    Current packs/day: 0.00    Average packs/day: 2.0 packs/day for 20.0 years (40.0 ttl pk-yrs)    Types: Cigarettes    Start date: 01/04/2002    Quit date: 01/04/2022    Years since quitting: 1.4   Smokeless tobacco: Former  Building services engineer status: Never Used  Substance and Sexual Activity   Alcohol use: No   Drug use: No   Sexual activity: Yes  Other Topics Concern   Not on file  Social History Narrative   Patient lives at home with spouse.   Caffeine Use: 2 L daily   Social Determinants of Health   Financial  Resource Strain: Low Risk  (01/22/2023)   Overall Financial Resource Strain (CARDIA)    Difficulty of Paying Living Expenses: Not hard at all  Food Insecurity: No Food Insecurity (01/22/2023)   Hunger Vital Sign    Worried About Running Out of Food in the Last Year: Never true    Ran Out of Food in the Last Year: Never true  Transportation Needs: No Transportation Needs (01/22/2023)   PRAPARE - Administrator, Civil Service (Medical): No    Lack of Transportation (Non-Medical): No  Physical Activity: Sufficiently Active (01/22/2023)   Exercise Vital Sign    Days of Exercise per Week: 5 days    Minutes of Exercise per Session: 50 min  Stress: Stress Concern Present (01/22/2023)   [employer] of Occupational Health - Occupational Stress Questionnaire    Feeling of Stress : To some extent  Social Connections: Socially Isolated (01/22/2023)   Social Connection and Isolation Panel [NHANES]    Frequency of Communication with Friends and Family: Once a week    Frequency of Social Gatherings with Friends and Family: Never    Attends Religious Services: Never    Database administrator or Organizations: No    Attends Engineer, structural: Not on file    Marital Status: Married  Catering manager Violence: Not on file    Review of Systems:  All other review of systems negative except as mentioned in the HPI.  Physical Exam: Vital signs There were no vitals taken for this visit.  General:   Alert,  Well-developed, well-nourished, pleasant and cooperative in NAD Lungs:  Clear throughout to auscultation.   Heart:  Regular rate and rhythm; no murmurs, clicks, rubs,  or gallops. Abdomen:  Soft, nontender and nondistended. Normal bowel sounds.   Neuro/Psych:  Alert and cooperative. Normal mood and affect. A and O x 3   @Jeremaine Maraj  Sena Slate, MD, [facility] [phone] (pager) 06/13/2023 5:31 PM@

## 2023-06-14 ENCOUNTER — Ambulatory Visit: Payer: BC Managed Care – PPO | Admitting: Internal Medicine

## 2023-06-14 ENCOUNTER — Encounter: Payer: Self-pay | Admitting: Internal Medicine

## 2023-06-14 VITALS — BP 103/65 | HR 54 | Temp 98.9°F | Resp 14 | Ht 73.0 in | Wt 235.0 lb

## 2023-06-14 DIAGNOSIS — Z1211 Encounter for screening for malignant neoplasm of colon: Secondary | ICD-10-CM

## 2023-06-14 DIAGNOSIS — K635 Polyp of colon: Secondary | ICD-10-CM | POA: Diagnosis not present

## 2023-06-14 DIAGNOSIS — D124 Benign neoplasm of descending colon: Secondary | ICD-10-CM

## 2023-06-14 DIAGNOSIS — Z860101 Personal history of adenomatous and serrated colon polyps: Secondary | ICD-10-CM | POA: Insufficient documentation

## 2023-06-14 DIAGNOSIS — D122 Benign neoplasm of ascending colon: Secondary | ICD-10-CM | POA: Diagnosis not present

## 2023-06-14 DIAGNOSIS — D125 Benign neoplasm of sigmoid colon: Secondary | ICD-10-CM

## 2023-06-14 DIAGNOSIS — D123 Benign neoplasm of transverse colon: Secondary | ICD-10-CM | POA: Diagnosis not present

## 2023-06-14 HISTORY — DX: Personal history of adenomatous and serrated colon polyps: Z86.0101

## 2023-06-14 MED ORDER — SODIUM CHLORIDE 0.9 % IV SOLN: 500.0000 mL | INTRAVENOUS | Status: DC

## 2023-06-14 NOTE — Op Note (Addendum)
[facility] Patient Name: Kevin Andrade Procedure Date: 06/14/2023 9:08 AM MRN: [id number] Endoscopist: Iva Boop , MD, [id number] Age: 52 Referring MD:  Date of Birth: 1970-12-24 Gender: Male Account #: [id number] Procedure:                Colonoscopy Indications:              Screening for colorectal malignant neoplasm, This                            is the patient's first colonoscopy Medicines:                Monitored Anesthesia Care Procedure:                Pre-Anesthesia Assessment:                           - Prior to the procedure, a History and Physical                            was performed, and patient medications and                            allergies were reviewed. The patient's tolerance of                            previous anesthesia was also reviewed. The risks                            and benefits of the procedure and the sedation                            options and risks were discussed with the patient.                            All questions were answered, and informed consent                            was obtained. Prior Anticoagulants: The patient has                            taken no anticoagulant or antiplatelet agents. ASA                            Grade Assessment: II - A patient with mild systemic                            disease. After reviewing the risks and benefits,                            the patient was deemed in satisfactory condition to                            undergo the procedure.  After obtaining informed consent, the colonoscope                            was passed under direct vision. Throughout the                            procedure, the patient's blood pressure, pulse, and                            oxygen saturations were monitored continuously. The                            CF [id number] #[id number] was introduced through the anus                            and advanced to the  the cecum, identified by                            appendiceal orifice and ileocecal valve. The                            colonoscopy was somewhat difficult due to spasm of                            colon. The patient tolerated the procedure well.                            The quality of the bowel preparation was good. The                            ileocecal valve, appendiceal orifice, and rectum                            were photographed. The bowel preparation used was                            Miralax via split dose instruction. Scope In: 9:15:05 AM Scope Out: 10:06:18 AM Scope Withdrawal Time: 0 hours 49 minutes 23 seconds  Total Procedure Duration: 0 hours 51 minutes 13 seconds  Findings:                 The perianal and digital rectal examinations were                            normal.                           A 25 mm polyp was found in the distal ascending                            colon. The polyp was sessile. The polyp was removed                            with a piecemeal technique using a  hot snare.                            Resection and retrieval were complete. Verification                            of patient identification for the specimen was                            done. Estimated blood loss was minimal. To prevent                            bleeding after the polypectomy, three hemostatic                            clips were successfully placed. Estimated blood                            loss: none. Soft coag tip cautery to edges also.                           Six sessile polyps were found in the sigmoid colon,                            descending colon, transverse colon and proximal                            ascending colon. The polyps were 3 to 15 mm in                            size. These polyps were removed with a cold snare.                            Resection and retrieval were complete. Verification                            of patient  identification for the specimen was                            done. Estimated blood loss was minimal. To prevent                            bleeding after the polypectomy, one hemostatic clip                            was successfully placed on one descending polyp                            that had immediate persistent bleeding. There was                            no bleeding at the end of the procedure.  Internal hemorrhoids were found.                           The exam was otherwise without abnormality on                            direct and retroflexion views. Complications:            No immediate complications. Estimated Blood Loss:     Estimated blood loss was minimal. Impression:               - One 25 mm polyp in the distal ascending colon,                            removed piecemeal using a hot snare. Resected and                            retrieved. Clips were placed.                           - Six 3 to 15 mm polyps in the sigmoid colon, in                            the descending colon, in the transverse colon and                            in the proximal ascending colon, removed with a                            cold snare. Resected and retrieved. Clip was placed.                           - Internal hemorrhoids.                           - The examination was otherwise normal on direct                            and retroflexion views. Recommendation:           - Patient has a contact number available for                            emergencies. The signs and symptoms of potential                            delayed complications were discussed with the                            patient. Return to normal activities tomorrow.                            Written discharge instructions were provided to the  patient.                           - Resume previous diet.                           - Continue present  medications.                           - No aspirin, ibuprofen, naproxen, or other                            non-steroidal anti-inflammatory drugs for 2 weeks                            after polyp removal.                           - Await pathology results.                           - Repeat colonoscopy is recommended. The                            colonoscopy date will be determined after pathology                            results from today's exam become available for                            review. Iva Boop, MD 06/14/2023 10:17:12 AM This report has been signed electronically.

## 2023-06-14 NOTE — Progress Notes (Signed)
To pacu, VSS. Report to [email or web address].tb 

## 2023-06-14 NOTE — Patient Instructions (Addendum)
I removed 7 polyps - one was large. All look benign but will review pathology and determine when to follow-up. Anticipate will need a recheck in about 6 months to make sure the large polyp is gone.  I appreciate the opportunity to care for you. Iva Boop, MD, [facility]   Handouts on polyps & hemorrhoids given to you today.   Await pathology results on polyps removed   2 CARDS TO CARRY IN YOUR WALLET GIVEN TO YOU TODAY TO INDICATE THESE CLIPS WERE PLACED IN YOUR COLON  NO ASPIRIN, ASPIRIN CONTAINING PRODUCTS (BC OR GOODY POWDERS) OR NSAIDS (IBUPROFEN, ADVIL, ALEVE, AND MOTRIN) FOR 2 weeks after polyp removal; TYLENOL IS OK TO TAKE  Continue previous diet & medications    YOU HAD AN ENDOSCOPIC PROCEDURE TODAY AT THE [facility]:   Refer to the procedure report that was given to you for any specific questions about what was found during the examination.  If the procedure report does not answer your questions, please call your gastroenterologist to clarify.  If you requested that your care partner not be given the details of your procedure findings, then the procedure report has been included in a sealed envelope for you to review at your convenience later.  YOU SHOULD EXPECT: Some feelings of bloating in the abdomen. Passage of more gas than usual.  Walking can help get rid of the air that was put into your GI tract during the procedure and reduce the bloating. If you had a lower endoscopy (such as a colonoscopy or flexible sigmoidoscopy) you may notice spotting of blood in your stool or on the toilet paper. If you underwent a bowel prep for your procedure, you may not have a normal bowel movement for a few days.  Please Note:  You might notice some irritation and congestion in your nose or some drainage.  This is from the oxygen used during your procedure.  There is no need for concern and it should clear up in a day or so.  SYMPTOMS TO REPORT IMMEDIATELY:  Following lower  endoscopy (colonoscopy or flexible sigmoidoscopy):  Excessive amounts of blood in the stool  Significant tenderness or worsening of abdominal pains  Swelling of the abdomen that is new, acute  Fever of 100F or higher   For urgent or emergent issues, a gastroenterologist can be reached at any hour by calling [phone]. Do not use MyChart messaging for urgent concerns.    DIET:  We do recommend a small meal at first, but then you may proceed to your regular diet.  Drink plenty of fluids but you should avoid alcoholic beverages for 24 hours.  ACTIVITY:  You should plan to take it easy for the rest of today and you should NOT DRIVE or use heavy machinery until tomorrow (because of the sedation medicines used during the test).    FOLLOW UP: Our staff will call the number listed on your records the next business day following your procedure.  We will call around 7:15- 8:00 am to check on you and address any questions or concerns that you may have regarding the information given to you following your procedure. If we do not reach you, we will leave a message.     If any biopsies were taken you will be contacted by phone or by letter within the next 1-3 weeks.  Please call us at [phone] if you have not heard about the biopsies in 3 weeks.  SIGNATURES/CONFIDENTIALITY: You and/or your care partner have signed paperwork which will be entered into your electronic medical record.  These signatures attest to the fact that that the information above on your After Visit Summary has been reviewed and is understood.  Full responsibility of the confidentiality of this discharge information lies with you and/or your care-partner.

## 2023-06-14 NOTE — Progress Notes (Signed)
Pt's states no medical or surgical changes since previsit or office visit. 

## 2023-06-14 NOTE — Progress Notes (Signed)
Called to room to assist during endoscopic procedure.  Patient ID and intended procedure confirmed with present staff. Received instructions for my participation in the procedure from the performing physician.  

## 2023-06-17 ENCOUNTER — Telehealth: Payer: Self-pay

## 2023-06-17 NOTE — Telephone Encounter (Signed)
Left message on answering machine. 

## 2023-06-18 LAB — SURGICAL PATHOLOGY

## 2023-06-20 DIAGNOSIS — G4733 Obstructive sleep apnea (adult) (pediatric): Secondary | ICD-10-CM | POA: Diagnosis not present

## 2023-06-23 ENCOUNTER — Encounter: Payer: Self-pay | Admitting: Internal Medicine

## 2023-07-10 IMAGING — US US THYROID
1 series · 13 of 25 positions shown · non-contrast
Comparison: 08/25/2014 and previous

CLINICAL DATA: Goiter

EXAM:
THYROID ULTRASOUND
TECHNIQUE: Ultrasound examination of the thyroid gland and adjacent soft
tissues was performed.

[Series 1: us thyroid · 0.08mm/px · 13 of 68 slices shown]
[im 1/68]
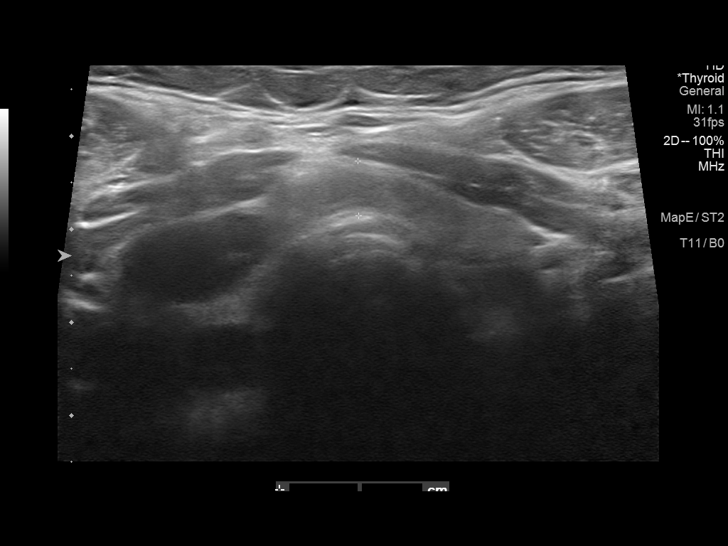
[im 6/68]
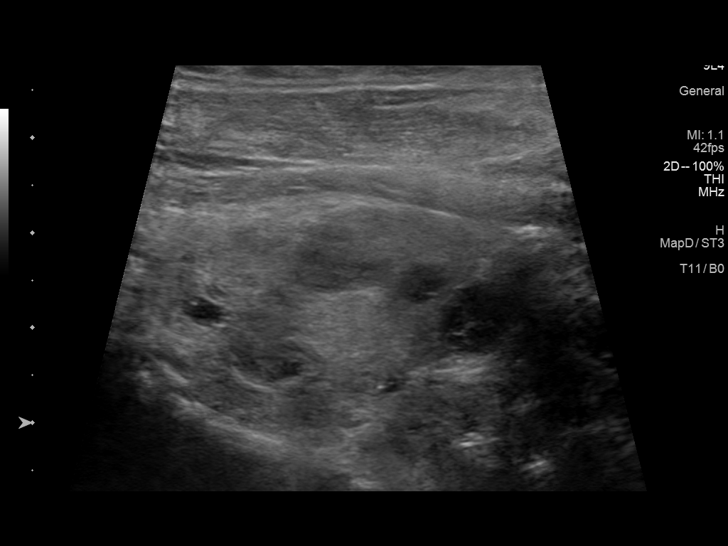
[im 12/68]
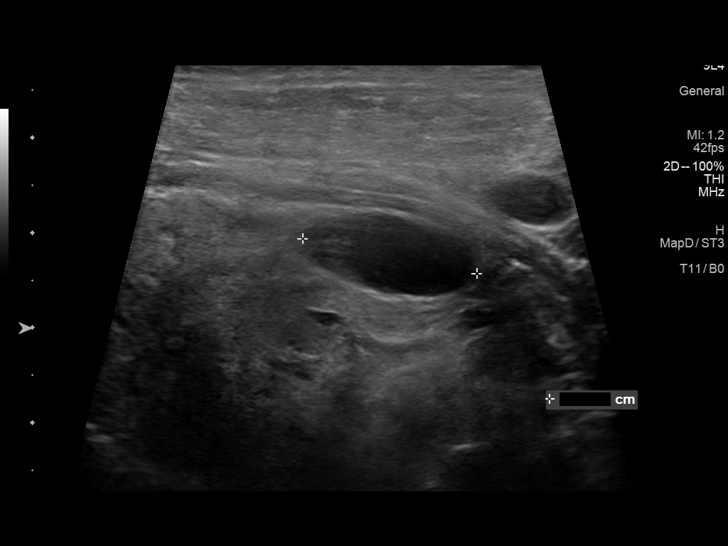
[im 17/68]
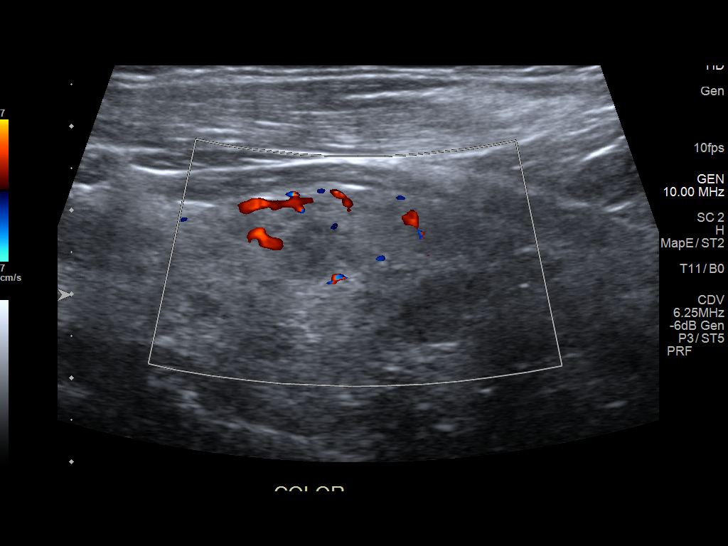
[im 23/68]
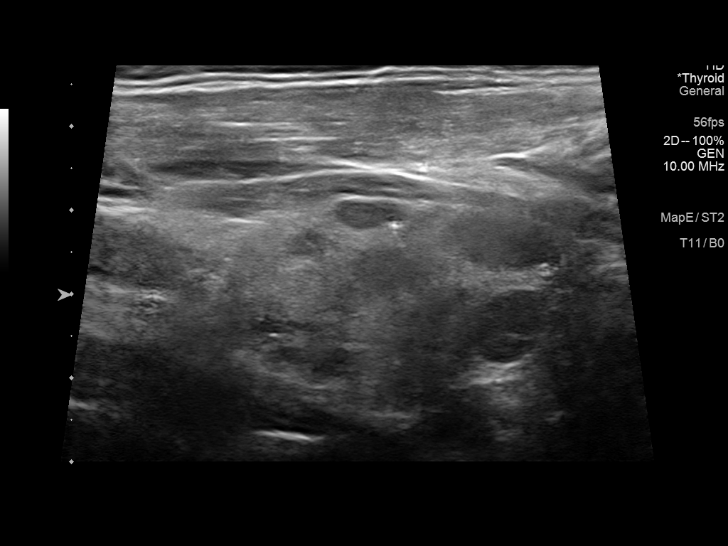
[im 28/68]
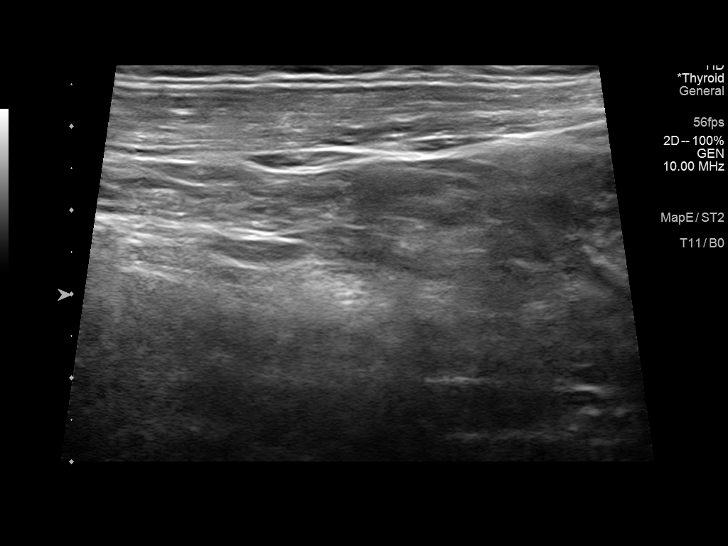
[im 34/68]
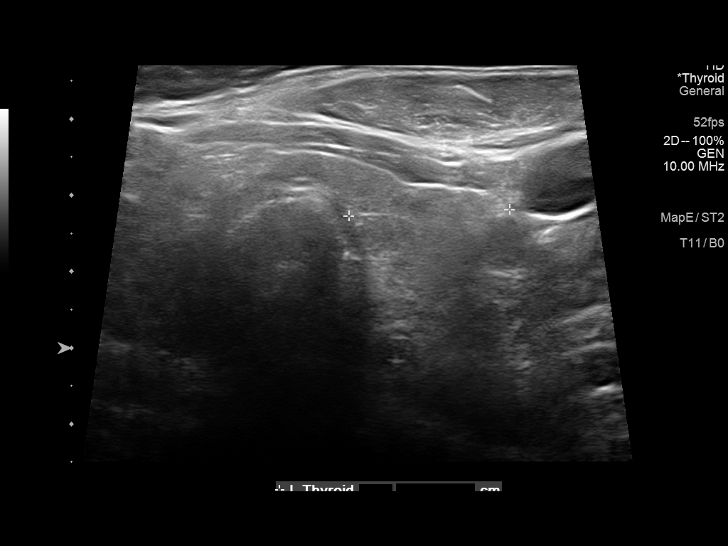
[im 40/68]
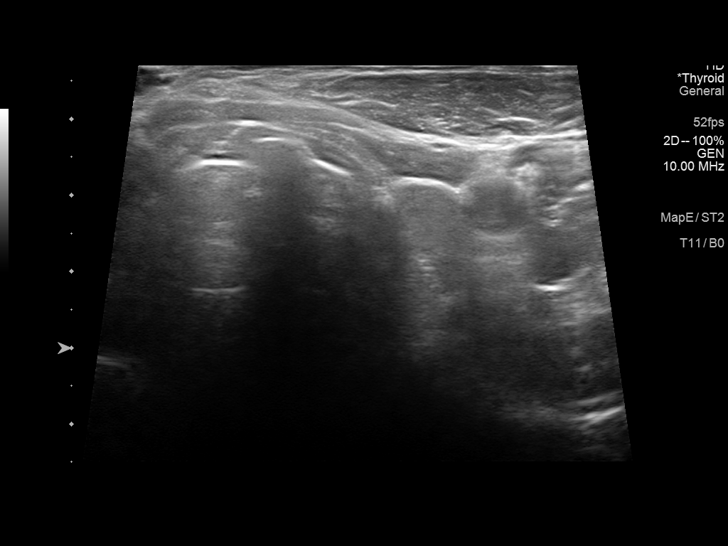
[im 45/68]
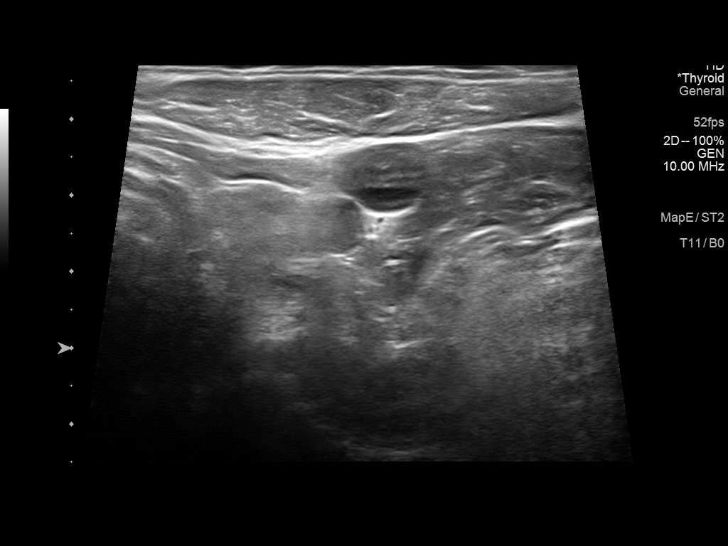
[im 51/68]
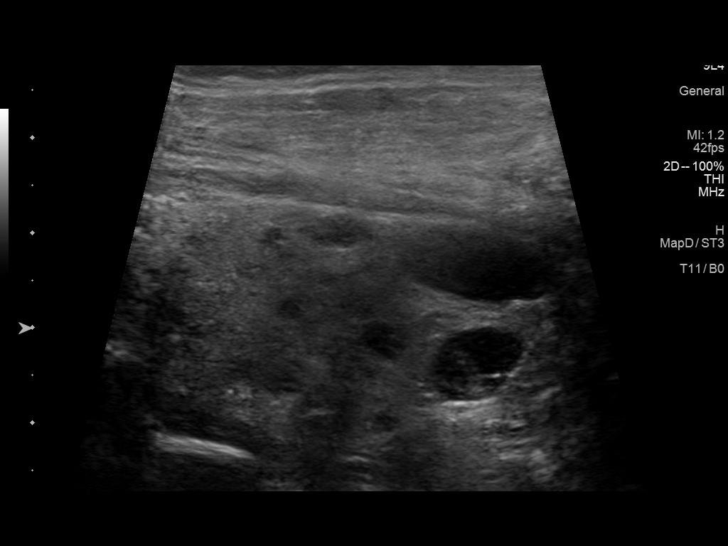
[im 56/68]
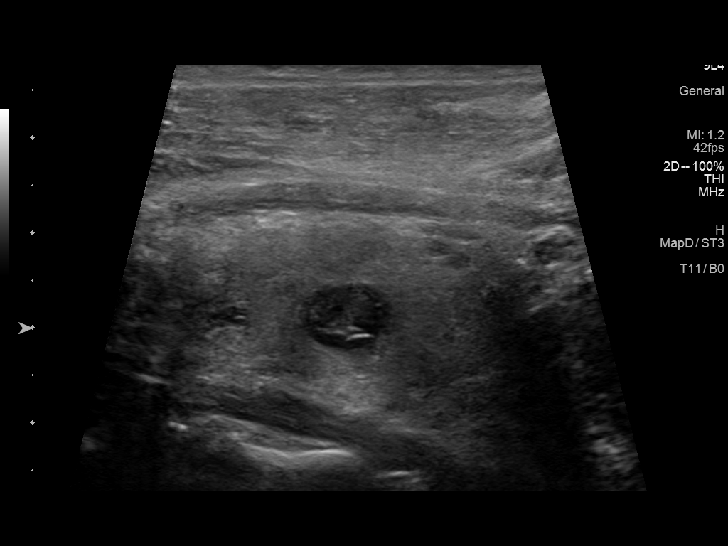
[im 62/68]
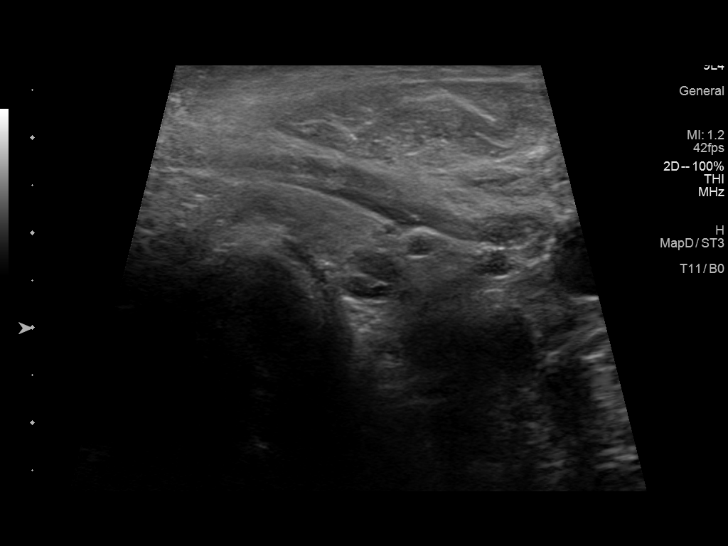
[im 68/68]
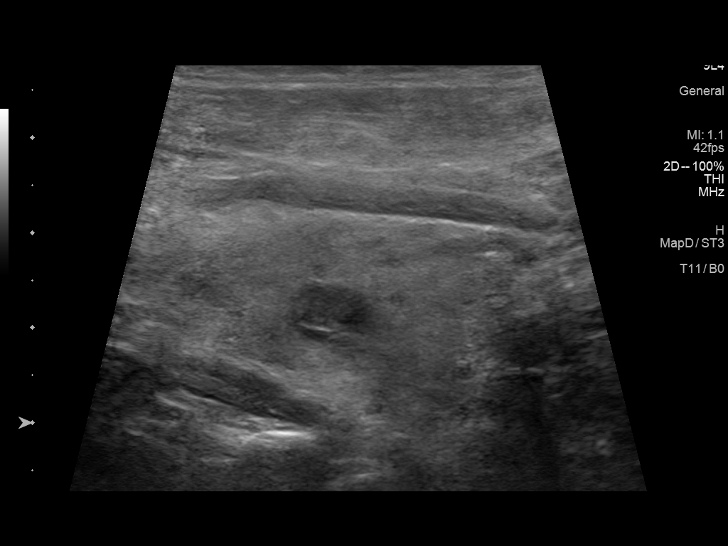

[13 of 25 positions shown; findings below may reference images not displayed]

FINDINGS: Parenchymal Echotexture: Moderately heterogenous

Isthmus: 0.6 cm thickness, stable

Right lobe: 6.5 x 2.3 x 2.1 cm, previously 4.5 x 2.3 x

Left lobe: 5.1 x 2.3 x 2.1 cm, previously 4.2 x 2.2 x

_________________________________________________________

Estimated total number of nodules >/= 1 cm: 3

Number of spongiform nodules >/=  2 cm not described below (TR1): 0

Number of mixed cystic and solid nodules >/= 1.5 cm not described
below (TR2): 0

_________________________________________________________

Nodule 1: 0.8 cm hypoechoic without calcifications, mid posterior
right; This nodule does NOT meet TI-RADS criteria for biopsy or
dedicated follow-up.

Nodule # 2:

Prior biopsy: No

Location: Right; mid

Maximum size: 1.2 cm; Other 2 dimensions: 0.9 x 0.8 cm, previously,
1 x 0.7 x 0.7 cm

Composition: solid/almost completely solid (2)

Echogenicity: hypoechoic (2)

Shape: not taller-than-wide (0)

Margins: smooth (0)

Echogenic foci: none (0)

ACR TI-RADS total points: 4.

ACR TI-RADS risk category:  TR4 (4-6 points).

Significant change in size (>/= 20% in two dimensions and minimal
increase of 2 mm): No

Change in features: No

Change in ACR TI-RADS risk category: No

ACR TI-RADS recommendations:

Stability for greater than 5 years implies benignity; This nodule
does NOT meet TI-RADS criteria for biopsy or dedicated follow-up.

_________________________________________________________

Nodule 3: 1.9 cm benign colloid cyst, inferior right, previously
1.3.

Nodule 4: 1 cm complex cyst without calcifications, inferior right,
previously 0.7; This nodule does NOT meet TI-RADS criteria for
biopsy or dedicated follow-up.

Nodule 5: 0.9 cm complex cyst, mid left, previously 0.9; This nodule
does NOT meet TI-RADS criteria for biopsy or dedicated follow-up.

Nodule 6: 0.6 cm complex cyst, inferior left, previously 0.8; This
nodule does NOT meet TI-RADS criteria for biopsy or dedicated
follow-up.

Nodule 7: 0.7 cm hypoechoic nodule without calcifications, inferior
left; This nodule does NOT meet TI-RADS criteria for biopsy or
dedicated follow-up.
IMPRESSION: 1. Thyromegaly with nodules as above. None currently meets criteria
for biopsy or follow-up.

The above is in keeping with the ACR TI-RADS recommendations - [HOSPITAL] 0980;[DATE].

## 2023-09-05 ENCOUNTER — Encounter (HOSPITAL_COMMUNITY): Payer: Self-pay | Admitting: Medical

## 2023-09-05 ENCOUNTER — Ambulatory Visit (HOSPITAL_COMMUNITY): Payer: BC Managed Care – PPO | Admitting: Medical

## 2023-09-05 ENCOUNTER — Encounter (HOSPITAL_COMMUNITY): Payer: Self-pay

## 2023-09-05 DIAGNOSIS — G4730 Sleep apnea, unspecified: Secondary | ICD-10-CM

## 2023-09-05 DIAGNOSIS — F1021 Alcohol dependence, in remission: Secondary | ICD-10-CM

## 2023-09-05 DIAGNOSIS — Z811 Family history of alcohol abuse and dependence: Secondary | ICD-10-CM

## 2023-09-05 DIAGNOSIS — F419 Anxiety disorder, unspecified: Secondary | ICD-10-CM | POA: Diagnosis not present

## 2023-09-05 DIAGNOSIS — Z79899 Other long term (current) drug therapy: Secondary | ICD-10-CM

## 2023-09-05 DIAGNOSIS — F32A Depression, unspecified: Secondary | ICD-10-CM | POA: Diagnosis not present

## 2023-09-05 DIAGNOSIS — F1121 Opioid dependence, in remission: Secondary | ICD-10-CM | POA: Diagnosis not present

## 2023-09-05 DIAGNOSIS — E66811 Obesity, class 1: Secondary | ICD-10-CM

## 2023-09-05 DIAGNOSIS — Z6831 Body mass index (BMI) 31.0-31.9, adult: Secondary | ICD-10-CM

## 2023-09-05 DIAGNOSIS — Z639 Problem related to primary support group, unspecified: Secondary | ICD-10-CM

## 2023-09-05 DIAGNOSIS — G4700 Insomnia, unspecified: Secondary | ICD-10-CM

## 2023-09-05 DIAGNOSIS — F1421 Cocaine dependence, in remission: Secondary | ICD-10-CM

## 2023-09-05 DIAGNOSIS — F17201 Nicotine dependence, unspecified, in remission: Secondary | ICD-10-CM

## 2023-09-05 DIAGNOSIS — F341 Dysthymic disorder: Secondary | ICD-10-CM

## 2023-09-05 DIAGNOSIS — F4312 Post-traumatic stress disorder, chronic: Secondary | ICD-10-CM

## 2023-09-05 MED ORDER — TRINTELLIX 20 MG PO TABS: ORAL_TABLET | ORAL | 1 refills | Status: DC

## 2023-09-05 MED ORDER — NALTREXONE HCL 50 MG PO TABS: 50.0000 mg | ORAL_TABLET | Freq: Every day | ORAL | 1 refills | Status: DC

## 2023-09-05 MED ORDER — ARIPIPRAZOLE 10 MG PO TABS: 10.0000 mg | ORAL_TABLET | Freq: Every day | ORAL | 1 refills | Status: DC

## 2023-09-05 MED ORDER — PREGABALIN 150 MG PO CAPS: 150.0000 mg | ORAL_CAPSULE | Freq: Three times a day (TID) | ORAL | 5 refills | Status: DC
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
## 2023-09-06 ENCOUNTER — Ambulatory Visit (AMBULATORY_SURGERY_CENTER): Payer: BC Managed Care – PPO

## 2023-09-06 VITALS — Ht 73.0 in | Wt 245.0 lb

## 2023-09-06 DIAGNOSIS — Z860100 Personal history of colon polyps, unspecified: Secondary | ICD-10-CM

## 2023-09-06 NOTE — Progress Notes (Signed)

## 2023-09-18 ENCOUNTER — Encounter: Payer: Self-pay | Admitting: Internal Medicine

## 2023-09-18 ENCOUNTER — Telehealth (HOSPITAL_COMMUNITY): Payer: Self-pay

## 2023-09-18 NOTE — Telephone Encounter (Signed)
Submitted prior auth for patients [employer]ell[employer] on cover my meds this morning

## 2023-09-19 NOTE — Progress Notes (Signed)
[facility] History and Physical   Primary Care Physician:  Deeann Saint, MD   Reason for Procedure:  Close follow-up after polypectomy of large polyp  Plan:    Colonoscopy     HPI: Kevin Andrade is a 53 y.o. male status post colonoscopy and polypectomy as outlined below in October 2024.  Here for close surveillance given the size of the 25 mm polyp in the pathology.  Want to ensure complete removal.  - One 25 mm polyp in the distal ascending colon,                            removed piecemeal using a hot snare. Resected and                            retrieved. Clips were placed.                           - Six 3 to 15 mm polyps in the sigmoid colon, in                            the descending colon, in the transverse colon and                            in the proximal ascending colon, removed with a                            cold snare. Resected and retrieved. Clip was placed.                           - Internal hemorrhoids.   1. Surgical [P], colon, ascending polyp x1, polyp (1) :      -  TUBULAR ADENOMA.       2. Surgical [P], colon, ascending x1 (distal), polyp (1) :      -  TUBULOVILLOUS ADENOMA WITH EXTENSIVE HIGH-GRADE DYSPLASIA.       3. Surgical [P], colon, transverse polyp x2, polyp (2) :      -  FRAGMENTS OF LOW-GRADE DYSPLASIA/TUBULAR ADENOMATOUS EPITHELIUM.      -  FRAGMENTS OF SESSILE SERRATED POLYP (GIVEN THE MULTIPLICITY OF POLYPS AND      FRAGMENTED NATURE OF THE SPECIMEN, IT IS NOT MORPHOLOGICALLY POSSIBLE TO      DISTINGUISH SEPARATE SESSILE SERRATED POLYPS AND TUBULAR ADENOMAS FROM POSSIBLE      SESSILE SERRATED POLYPS WITH DYSPLASIA).       4. Surgical [P], colon, descending polyp x2, sigmoid x1, polyp (3) :      -  TUBULAR ADENOMA, FRAGMENTS.   Past Medical History:  Diagnosis Date   Anxiety    Depression    Hx of adenomatous and sessile serrated colonic polyps 06/14/2023   Hypersomnia, persistent    Insomnia with sleep  apnea    Sleep apnea    Substance abuse ([name])    Tobacco use disorder     Past Surgical History:  Procedure Laterality Date   AMPUTATION Right 12/18/2018   Procedure: RIGHT DISTAL PHALANX INDEX FINGER AMPUTATION;  Surgeon: Betha Loa, MD;  Location: [facility];  Service: Orthopedics;  Laterality: Right;   COLONOSCOPY W/ POLYPECTOMY  KNEE SURGERY Left     Prior to Admission medications   Medication Sig Start Date End Date Taking? Authorizing Provider  ARIPiprazole (ABILIFY) 10 MG tablet Take 1 tablet (10 mg total) by mouth daily. 09/05/23   Court Joy, PA-C  ibuprofen (ADVIL) 200 MG tablet Take 200 mg by mouth every 6 (six) hours as needed.    [provider]  ibuprofen (ADVIL) 600 MG tablet Take 1 tablet (600 mg total) by mouth every 6 (six) hours as needed for up to 30 doses for mild pain or moderate pain. Patient not taking: Reported on 09/06/2023 01/15/23   Terald Sleeper, MD  naltrexone (DEPADE) 50 MG tablet Take 1 tablet (50 mg total) by mouth daily. 09/05/23 08/30/24  Court Joy, PA-C  pregabalin (LYRICA) 150 MG capsule Take 1 capsule (150 mg total) by mouth in the morning, at noon, and at bedtime. 09/05/23 03/03/24  Court Joy, PA-C  TRINTELLIX 20 MG TABS tablet TAKE 1 TABLET BY MOUTH EVERY DAY 09/05/23   Court Joy, PA-C  varenicline ([name]) 1 MG tablet Take 1 tablet (1 mg total) by mouth 2 (two) times daily. Patient not taking: Reported on 09/06/2023 02/28/22   Deeann Saint, MD    Current Outpatient Medications  Medication Sig Dispense Refill   ARIPiprazole (ABILIFY) 10 MG tablet Take 1 tablet (10 mg total) by mouth daily. 90 tablet 1   naltrexone (DEPADE) 50 MG tablet Take 1 tablet (50 mg total) by mouth daily. 90 tablet 1   pregabalin (LYRICA) 150 MG capsule Take 1 capsule (150 mg total) by mouth in the morning, at noon, and at bedtime. 90 capsule 5   TRINTELLIX 20 MG TABS tablet TAKE 1 TABLET BY MOUTH EVERY DAY 90 tablet 1    ibuprofen (ADVIL) 200 MG tablet Take 200 mg by mouth every 6 (six) hours as needed.     ibuprofen (ADVIL) 600 MG tablet Take 1 tablet (600 mg total) by mouth every 6 (six) hours as needed for up to 30 doses for mild pain or moderate pain. (Patient not taking: Reported on 09/06/2023) 30 tablet 0   varenicline ([name]) 1 MG tablet Take 1 tablet (1 mg total) by mouth 2 (two) times daily. (Patient not taking: Reported on 09/20/2023) 180 tablet 1   Current Facility-Administered Medications  Medication Dose Route Frequency Provider Last Rate Last Admin   0.9 %  sodium chloride infusion  500 mL Intravenous Once Iva Boop, MD        Allergies as of 09/20/2023   (No Known Allergies)    Family History  Problem Relation Age of Onset   Diabetes Mother    Arthritis Mother    Cancer Father        lymphoma   Hyperlipidemia Father    Alcohol abuse Father    Arthritis Father    Depression Father    Diabetes Father    Stroke Father    Colon cancer Neg Hx    Esophageal cancer Neg Hx    Rectal cancer Neg Hx    Stomach cancer Neg Hx    Colon polyps Neg Hx     Social History   Socioeconomic History   Marital status: Married    Spouse name: [name]   Number of children: 2   Years of education: HS   Highest education level: GED or equivalent  Occupational History   Not on file  Tobacco Use   Smoking status: Former  Current packs/day: 0.00    Average packs/day: 2.0 packs/day for 20.0 years (40.0 ttl pk-yrs)    Types: Cigarettes    Start date: 01/04/2002    Quit date: 01/04/2022    Years since quitting: 1.7   Smokeless tobacco: Former  Building services engineer status: Never Used  Substance and Sexual Activity   Alcohol use: No   Drug use: No   Sexual activity: Yes  Other Topics Concern   Not on file  Social History Narrative   Patient lives at home with spouse.Caffeine Use: 2 L daily   Wife is Dr. Desma Maxim CMA [facility])   Social Drivers of Health   Financial Resource Strain:  Low Risk  (01/22/2023)   Overall Financial Resource Strain (CARDIA)    Difficulty of Paying Living Expenses: Not hard at all  Food Insecurity: No Food Insecurity (01/22/2023)   Hunger Vital Sign    Worried About Running Out of Food in the Last Year: Never true    Ran Out of Food in the Last Year: Never true  Transportation Needs: No Transportation Needs (01/22/2023)   PRAPARE - Administrator, Civil Service (Medical): No    Lack of Transportation (Non-Medical): No  Physical Activity: Sufficiently Active (01/22/2023)   Exercise Vital Sign    Days of Exercise per Week: 5 days    Minutes of Exercise per Session: 50 min  Stress: Stress Concern Present (01/22/2023)   [employer] of Occupational Health - Occupational Stress Questionnaire    Feeling of Stress : To some extent  Social Connections: Socially Isolated (01/22/2023)   Social Connection and Isolation Panel [NHANES]    Frequency of Communication with Friends and Family: Once a week    Frequency of Social Gatherings with Friends and Family: Never    Attends Religious Services: Never    Database administrator or Organizations: No    Attends Engineer, structural: Not on file    Marital Status: Married  Catering manager Violence: Not on file    Review of Systems:  All other review of systems negative except as mentioned in the HPI.  Physical Exam: Vital signs BP (!) 121/59   Pulse 74   Temp 98.4 F (36.9 C)   Resp 20   Ht 6\' 1"  (1.854 m)   Wt 245 lb (111.1 kg)   SpO2 93%   BMI 32.32 kg/m   General:   Alert,  Well-developed, well-nourished, pleasant and cooperative in NAD Lungs:  Clear throughout to auscultation.   Heart:  Regular rate and rhythm; no murmurs, clicks, rubs,  or gallops. Abdomen:  Soft, nontender and nondistended. Normal bowel sounds.   Neuro/Psych:  Alert and cooperative. Normal mood and affect. A and O x 3   @Ayinde Swim  Sena Slate, MD, [facility] [phone]  (pager) 09/20/2023 8:01 AM@

## 2023-09-20 ENCOUNTER — Encounter: Payer: Self-pay | Admitting: Internal Medicine

## 2023-09-20 ENCOUNTER — Ambulatory Visit (AMBULATORY_SURGERY_CENTER): Payer: BC Managed Care – PPO | Admitting: Internal Medicine

## 2023-09-20 VITALS — BP 104/62 | HR 61 | Temp 98.4°F | Resp 17 | Ht 73.0 in | Wt 245.0 lb

## 2023-09-20 DIAGNOSIS — D124 Benign neoplasm of descending colon: Secondary | ICD-10-CM

## 2023-09-20 DIAGNOSIS — Z860101 Personal history of adenomatous and serrated colon polyps: Secondary | ICD-10-CM | POA: Diagnosis not present

## 2023-09-20 DIAGNOSIS — D122 Benign neoplasm of ascending colon: Secondary | ICD-10-CM

## 2023-09-20 DIAGNOSIS — K5520 Angiodysplasia of colon without hemorrhage: Secondary | ICD-10-CM

## 2023-09-20 DIAGNOSIS — Z9889 Other specified postprocedural states: Secondary | ICD-10-CM | POA: Diagnosis not present

## 2023-09-20 DIAGNOSIS — Z1211 Encounter for screening for malignant neoplasm of colon: Secondary | ICD-10-CM | POA: Diagnosis not present

## 2023-09-20 DIAGNOSIS — Z860100 Personal history of colon polyps, unspecified: Secondary | ICD-10-CM

## 2023-09-20 MED ORDER — SODIUM CHLORIDE 0.9 % IV SOLN
500.0000 mL | Freq: Once | INTRAVENOUS | Status: AC
Start: 2023-09-20 — End: ?

## 2023-09-20 NOTE — Progress Notes (Signed)
Called to room to assist during endoscopic procedure.  Patient ID and intended procedure confirmed with present staff. Received instructions for my participation in the procedure from the performing physician.

## 2023-09-20 NOTE — Patient Instructions (Addendum)
It looks like there is residual polyp where I removed the large polyp in October. I took biopsies to check and will let you know and recommendations. Two other tiny polyps seen and removed.  Possible AVM's (surface blood vessels in one area also - not likely to ever be an issue).  I will be in contact about results and recommendations.  I appreciate the opportunity to care for you. Iva Boop, MD, FACG     YOU HAD AN ENDOSCOPIC PROCEDURE TODAY AT THE [facility]:   Refer to the procedure report that was given to you for any specific questions about what was found during the examination.  If the procedure report does not answer your questions, please call your gastroenterologist to clarify.  If you requested that your care partner not be given the details of your procedure findings, then the procedure report has been included in a sealed envelope for you to review at your convenience later.  YOU SHOULD EXPECT: Some feelings of bloating in the abdomen. Passage of more gas than usual.  Walking can help get rid of the air that was put into your GI tract during the procedure and reduce the bloating. If you had a lower endoscopy (such as a colonoscopy or flexible sigmoidoscopy) you may notice spotting of blood in your stool or on the toilet paper. If you underwent a bowel prep for your procedure, you may not have a normal bowel movement for a few days.  Please Note:  You might notice some irritation and congestion in your nose or some drainage.  This is from the oxygen used during your procedure.  There is no need for concern and it should clear up in a day or so.  SYMPTOMS TO REPORT IMMEDIATELY:  Following lower endoscopy (colonoscopy or flexible sigmoidoscopy):  Excessive amounts of blood in the stool  Significant tenderness or worsening of abdominal pains  Swelling of the abdomen that is new, acute  Fever of 100F or higher   For urgent or emergent issues, a  gastroenterologist can be reached at any hour by calling [phone]. Do not use MyChart messaging for urgent concerns.    DIET:  We do recommend a small meal at first, but then you may proceed to your regular diet.  Drink plenty of fluids but you should avoid alcoholic beverages for 24 hours.  MEDICATIONS: Continue present medications.  Please see handouts given to you by your recovery nurse: Polyps.  FOLLOW UP: Await pathology results.   Thank you for allowing [place] to provide for your healthcare needs today.  ACTIVITY:  You should plan to take it easy for the rest of today and you should NOT DRIVE or use heavy machinery until tomorrow (because of the sedation medicines used during the test).    FOLLOW UP: Our staff will call the number listed on your records the next business day following your procedure.  We will call around 7:15- 8:00 am to check on you and address any questions or concerns that you may have regarding the information given to you following your procedure. If we do not reach you, we will leave a message.     If any biopsies were taken you will be contacted by phone or by letter within the next 1-3 weeks.  Please call us at [phone] if you have not heard about the biopsies in 3 weeks.    SIGNATURES/CONFIDENTIALITY: You and/or your care partner have signed paperwork which will be entered into  your electronic medical record.  These signatures attest to the fact that that the information above on your After Visit Summary has been reviewed and is understood.  Full responsibility of the confidentiality of this discharge information lies with you and/or your care-partner.

## 2023-09-20 NOTE — Op Note (Signed)
[facility] Patient Name: Kevin Andrade Procedure Date: 09/20/2023 8:02 AM MRN: [id number] Endoscopist: Iva Boop , MD, [id number] Age: 53 Referring MD:  Date of Birth: 06/19/1971 Gender: Male Account #: [id number] Procedure:                Colonoscopy Indications:              Surveillance: Piecemeal removal of large sessile                            adenoma last colonoscopy (< 3 yrs), Last                            colonoscopy: October 2024 Medicines:                Monitored Anesthesia Care Procedure:                Pre-Anesthesia Assessment:                           - Prior to the procedure, a History and Physical                            was performed, and patient medications and                            allergies were reviewed. The patient's tolerance of                            previous anesthesia was also reviewed. The risks                            and benefits of the procedure and the sedation                            options and risks were discussed with the patient.                            All questions were answered, and informed consent                            was obtained. Prior Anticoagulants: The patient has                            taken no anticoagulant or antiplatelet agents. ASA                            Grade Assessment: II - A patient with mild systemic                            disease. After reviewing the risks and benefits,                            the patient was deemed in satisfactory condition to  undergo the procedure.                           After obtaining informed consent, the colonoscope                            was passed under direct vision. Throughout the                            procedure, the patient's blood pressure, pulse, and                            oxygen saturations were monitored continuously. The                            Olympus Scope SN: [id number] was introduced  through                            the anus and advanced to the the cecum, identified                            by appendiceal orifice and ileocecal valve. The                            colonoscopy was performed without difficulty. The                            patient tolerated the procedure well. The quality                            of the bowel preparation was good. The ileocecal                            valve, appendiceal orifice, and rectum were                            photographed. The bowel preparation used was                            Miralax via split dose instruction. Scope In: 8:07:09 AM Scope Out: 8:33:36 AM Scope Withdrawal Time: 0 hours 24 minutes 9 seconds  Total Procedure Duration: 0 hours 26 minutes 27 seconds  Findings:                 The perianal and digital rectal examinations were                            normal.                           A post polypectomy scar was found in the distal                            ascending colon. There was residual polyp tissue.  Biopsies were taken with a cold forceps for                            histology and two polypoid areas snared. Area was                            tattooed with an injection of 3 mL of Spot (carbon                            black).                           Two sessile polyps were found in the proximal                            descending colon and proximal ascending colon. The                            polyps were diminutive in size. These polyps were                            removed with a cold snare. Resection and retrieval                            were complete. Verification of patient                            identification for the specimen was done. Estimated                            blood loss was minimal.                           Two small angioectasias without bleeding were found                            in the cecum.                            The exam was otherwise without abnormality on                            direct and retroflexion views. Complications:            No immediate complications. Estimated Blood Loss:     Estimated blood loss was minimal. Impression:               - Post-polypectomy scar in the distal ascending                            colon. Biopsied. Tattooed.                           - Two diminutive polyps in the proximal descending  colon and in the proximal ascending colon, removed                            with a cold snare. Resected and retrieved.                           - Two non-bleeding colonic angioectasias. Suspected                            vs trauma                           - The examination was otherwise normal on direct                            and retroflexion views.                           - Personal history of colonic polyps.                           - One 25 mm polyp in the distal ascending colon,                           removed piecemeal using a hot snare. Resected and                           retrieved. Clips were placed.                           - Six 3 to 15 mm polyps in the sigmoid colon, in                           the descending colon, in the transverse colon and                           in the proximal ascending colon, removed with a                           cold snare. Resected and retrieved. Clip was placed.                           - Internal hemorrhoids.                           1. Surgical [P], colon, ascending polyp x1, polyp                            (1) :                           - TUBULAR ADENOMA.                           2. Surgical [P], colon, ascending x1 (distal),  polyp (1) :                           - TUBULOVILLOUS ADENOMA WITH EXTENSIVE HIGH-GRADE                            DYSPLASIA.                           3. Surgical [P], colon, transverse polyp x2, polyp                             (2) :                           - FRAGMENTS OF LOW-GRADE DYSPLASIA/TUBULAR                            ADENOMATOUS EPITHELIUM.                           - FRAGMENTS OF SESSILE SERRATED POLYP (GIVEN THE                            MULTIPLICITY OF POLYPS AND                           FRAGMENTED NATURE OF THE SPECIMEN, IT IS NOT                            MORPHOLOGICALLY POSSIBLE TO                           DISTINGUISH SEPARATE SESSILE SERRATED POLYPS AND                            TUBULAR ADENOMAS FROM POSSIBLE                           SESSILE SERRATED POLYPS WITH DYSPLASIA).                           4. Surgical [P], colon, descending polyp x2,                            sigmoid x1, polyp (3) :                           - TUBULAR ADENOMA, FRAGMENTS. Recommendation:           - Patient has a contact number available for                            emergencies. The signs and symptoms of potential                            delayed complications were discussed with the  patient. Return to normal activities tomorrow.                            Written discharge instructions were provided to the                            patient.                           - Resume previous diet.                           - Continue present medications.                           - Await pathology results. Could need endorotor or                            full thickness removal Iva Boop, MD 09/20/2023 8:51:30 AM This report has been signed electronically.

## 2023-09-20 NOTE — Progress Notes (Signed)
Drowsy, VSS, resps reg and even. Report to RN

## 2023-09-23 ENCOUNTER — Telehealth: Payer: Self-pay

## 2023-09-23 NOTE — Telephone Encounter (Signed)
Left message on answering machine.

## 2023-09-24 ENCOUNTER — Encounter: Payer: Self-pay | Admitting: Internal Medicine

## 2023-09-24 LAB — SURGICAL PATHOLOGY

## 2023-10-02 ENCOUNTER — Ambulatory Visit: Payer: BC Managed Care – PPO | Admitting: Internal Medicine

## 2023-10-02 ENCOUNTER — Encounter: Payer: Self-pay | Admitting: Internal Medicine

## 2023-10-02 VITALS — BP 110/70 | HR 60 | Temp 98.1°F | Wt 243.5 lb

## 2023-10-02 DIAGNOSIS — J440 Chronic obstructive pulmonary disease with acute lower respiratory infection: Secondary | ICD-10-CM | POA: Diagnosis not present

## 2023-10-02 DIAGNOSIS — J209 Acute bronchitis, unspecified: Secondary | ICD-10-CM

## 2023-10-02 MED ORDER — AZITHROMYCIN 250 MG PO TABS: ORAL_TABLET | ORAL | 0 refills | Status: AC

## 2023-10-02 MED ORDER — ALBUTEROL SULFATE HFA 108 (90 BASE) MCG/ACT IN AERS: 2.0000 | INHALATION_SPRAY | Freq: Four times a day (QID) | RESPIRATORY_TRACT | 2 refills | Status: AC | PRN

## 2023-10-02 NOTE — Progress Notes (Signed)
 Established Patient Office Visit     CC/Reason for Visit: Flulike symptoms  HPI: Kevin Andrade is a 53 y.o. male who is coming in today for the above mentioned reasons. Past Medical History is significant for: Ongoing tobacco use of about a pack a day.  For the past 7 to 8 days he has been having runny nose, congestion, sinus pain and pressure cough productive of clear sputum and mild shortness of breath.  He says his wife was recently diagnosed with bronchitis/pneumonia and treated with antibiotics.  No fever although he has had chills.   Past Medical/Surgical History: Past Medical History:  Diagnosis Date   Anxiety    Depression    Hx of adenomatous and sessile serrated colonic polyps 06/14/2023   Hypersomnia, persistent    Insomnia with sleep apnea    Sleep apnea    Substance abuse ([name])    Tobacco use disorder     Past Surgical History:  Procedure Laterality Date   AMPUTATION Right 12/18/2018   Procedure: RIGHT DISTAL PHALANX INDEX FINGER AMPUTATION;  Surgeon: Murrell Drivers, MD;  Location: [facility];  Service: Orthopedics;  Laterality: Right;   COLONOSCOPY W/ POLYPECTOMY     KNEE SURGERY Left     Social History:  reports that he has been smoking cigarettes. He started smoking about 21 years ago. He has a 40 pack-year smoking history. He has quit using smokeless tobacco. He reports that he does not drink alcohol  and does not use drugs.  Allergies: No Known Allergies  Family History:  Family History  Problem Relation Age of Onset   Diabetes Mother    Arthritis Mother    Cancer Father        lymphoma   Hyperlipidemia Father    Alcohol  abuse Father    Arthritis Father    Depression Father    Diabetes Father    Stroke Father    Colon cancer Neg Hx    Esophageal cancer Neg Hx    Rectal cancer Neg Hx    Stomach cancer Neg Hx    Colon polyps Neg Hx      Current Outpatient Medications:    albuterol  (VENTOLIN  HFA) 108 (90 Base)  MCG/ACT inhaler, Inhale 2 puffs into the lungs every 6 (six) hours as needed for wheezing or shortness of breath., Disp: 8 g, Rfl: 2   ARIPiprazole  (ABILIFY ) 10 MG tablet, Take 1 tablet (10 mg total) by mouth daily., Disp: 90 tablet, Rfl: 1   azithromycin  (ZITHROMAX ) 250 MG tablet, Take 2 tablets on day 1, then 1 tablet daily on days 2 through 5, Disp: 6 tablet, Rfl: 0   ibuprofen  (ADVIL ) 200 MG tablet, Take 200 mg by mouth every 6 (six) hours as needed., Disp: , Rfl:    ibuprofen  (ADVIL ) 600 MG tablet, Take 1 tablet (600 mg total) by mouth every 6 (six) hours as needed for up to 30 doses for mild pain or moderate pain., Disp: 30 tablet, Rfl: 0   naltrexone  (DEPADE) 50 MG tablet, Take 1 tablet (50 mg total) by mouth daily., Disp: 90 tablet, Rfl: 1   pregabalin  (LYRICA ) 150 MG capsule, Take 1 capsule (150 mg total) by mouth in the morning, at noon, and at bedtime., Disp: 90 capsule, Rfl: 5   TRINTELLIX  20 MG TABS tablet, TAKE 1 TABLET BY MOUTH EVERY DAY, Disp: 90 tablet, Rfl: 1   varenicline  ([name] ) 1 MG tablet, Take 1 tablet (1 mg total) by mouth 2 (two)  times daily., Disp: 180 tablet, Rfl: 1  Current Facility-Administered Medications:    0.9 %  sodium chloride  infusion, 500 mL, Intravenous, Once, Avram Lupita BRAVO, MD  Review of Systems:  Negative unless indicated in HPI.   Physical Exam: Vitals:   10/02/23 0703  BP: 110/70  Pulse: 60  Temp: 98.1 F (36.7 C)  TempSrc: Oral  SpO2: 90%  Weight: 243 lb 8 oz (110.5 kg)    Body mass index is 32.13 kg/m.   Physical Exam Pulmonary:     Effort: Pulmonary effort is normal.     Breath sounds: No stridor. Examination of the right-upper field reveals wheezing. Examination of the left-upper field reveals wheezing. Examination of the right-middle field reveals wheezing. Examination of the left-middle field reveals wheezing. Examination of the right-lower field reveals wheezing. Examination of the left-lower field reveals wheezing. Wheezing  present.      Impression and Plan:  Acute bronchitis with COPD ([name]) -     Azithromycin ; Take 2 tablets on day 1, then 1 tablet daily on days 2 through 5  Dispense: 6 tablet; Refill: 0 -     Albuterol  Sulfate HFA; Inhale 2 puffs into the lungs every 6 (six) hours as needed for wheezing or shortness of breath.  Dispense: 8 g; Refill: 2   -Suspect this is a viral URI, likely influenza A given activity in the community.  However given his ongoing tobacco use and significant end expiratory wheezes I feel it is prudent to treat him with a Z-Pak and an albuterol  inhaler.  Time spent:30 minutes reviewing chart, interviewing and examining patient and formulating plan of care.     Tully Theophilus Andrews, MD [facility]

## 2023-10-03 ENCOUNTER — Telehealth: Payer: Self-pay | Admitting: Family Medicine

## 2023-10-03 NOTE — Telephone Encounter (Signed)
 Copied from CRM [id number]. Topic: General - Other >> Oct 03, 2023  8:09 AM [name] wrote: Reason for CRM: Patient was in office for a visit on 10/02/2023 and forget to get a doctors note, he goes back to work on Sunday. Patient would like for the document to be uploaded into MyChart if possible.  Kevin Andrade [phone]

## 2023-10-07 NOTE — Telephone Encounter (Signed)
 Patient would like a note 10/02/23 - 10/06/23 due to bronchitis.

## 2023-10-21 ENCOUNTER — Other Ambulatory Visit (HOSPITAL_COMMUNITY): Payer: Self-pay | Admitting: Medical

## 2023-10-23 NOTE — Telephone Encounter (Signed)
 Pt has refills til August from last visit

## 2023-10-28 ENCOUNTER — Telehealth (HOSPITAL_COMMUNITY): Payer: Self-pay | Admitting: *Deleted

## 2023-10-28 ENCOUNTER — Other Ambulatory Visit (HOSPITAL_COMMUNITY): Payer: Self-pay | Admitting: Medical

## 2023-10-28 MED ORDER — PREGABALIN 150 MG PO CAPS: 150.0000 mg | ORAL_CAPSULE | Freq: Three times a day (TID) | ORAL | 1 refills | Status: DC

## 2023-10-28 NOTE — Telephone Encounter (Signed)
 Pt called stating that he needs a 90 day or 6 months supply per insurance. Pt has 30 day with refills currently. Pt next appointment scheduled for 03/05/24. Please review.

## 2023-10-28 NOTE — Progress Notes (Signed)
 Patient ID: Kevin Andrade, male   DOB: [birth year], 53 y.o.   MRN: [id number]  Insurance requesting modification of Lyrica Rx to 90 days or 6 months Rx sent fpr 90 days with 1 refill

## 2024-01-14 ENCOUNTER — Other Ambulatory Visit: Payer: Self-pay

## 2024-01-14 ENCOUNTER — Telehealth: Payer: Self-pay | Admitting: Internal Medicine

## 2024-01-14 DIAGNOSIS — Z860101 Personal history of adenomatous and serrated colon polyps: Secondary | ICD-10-CM

## 2024-01-14 NOTE — Telephone Encounter (Signed)
 Next available is 04/16/24. Patient accepts this appointment.

## 2024-01-14 NOTE — Telephone Encounter (Signed)
 Patient called and stated that he is needing to change his appointment due to him not realizing that he will be gone for vacation. Patient is requesting a call back. Please advise.

## 2024-01-14 NOTE — Telephone Encounter (Signed)
 Inbound call from patient, would like to schedule his recall colonoscopy at the hospital with Dr. Willy Harvest   "Plan to recall in May 2025 and will arrange a follow-up colonoscopy,  at the hospital where ablative instruments are available.   I called the patient about this and we will send a letter also."   Per pathology report.

## 2024-01-14 NOTE — Telephone Encounter (Signed)
 Contacted the patient. He agrees to 03/12/24 at [facility]. He declines a pre-visit for instructions stating he is comfortable with the instructions being mailed to him.

## 2024-02-11 ENCOUNTER — Ambulatory Visit: Payer: Self-pay

## 2024-02-11 NOTE — Telephone Encounter (Signed)
 FYI Only or Action Required?: FYI only for provider  Patient was last seen in primary care on 10/02/2023 by Zilphia Hilt, Charyl Coppersmith, MD. Called Nurse Triage reporting [facility]. Symptoms began a week ago. Interventions attempted: OTC medications: anti-itch cream, charcoal wash. Symptoms are: unchanged.  Triage Disposition: See Physician Within 24 Hours  Patient/caregiver understands and will follow disposition?: Yes, but will wait   Copied from CRM [id number]. Topic: Clinical - Red Word Triage >> Feb 11, 2024  2:58 PM Kevin Andrade wrote: Red Word that prompted transfer to Nurse Triage:  Pt states he has poison ivy and oak all over both legs and beginning to spread all over. This has been ongoing for over a week now. Swelling and itching noted. Spreading up the leg and now has few spots on his arms. Reason for Disposition  MODERATE to SEVERE itching (e.g., interferes with work, school, sleep, or other activities)  Answer Assessment - Initial Assessment Questions 1. APPEARANCE of RASH: Describe the rash.      Red, itchy 2. LOCATION: Where is the rash located?  (e.g., face, genitals, hands, legs)     Bilateral legs, now spreading to arms     3. ONSET: When did the rash begin?      One week ago 4. ITCHING: Does the rash itch? If Yes, ask: How bad is it?   - MILD - doesn't interfere with normal activities   - MODERATE-SEVERE: interferes with work, school, sleep, or other activities      moderate  5. PAST HISTORY: Have you had a poison ivy rash before? If Yes, ask: How bad was it?     Yes, has needed steroid in the past    Additional info: anti itch cream with minimal effect. Using charcoal wash which normally clears rash but not working this time. Has poison ivy/oak rashes typically several times per year, has needed steroids in the past but not with each rash. Patient requests to be scheduled with pcp on Friday due to work limitations, feels his symptoms are stable and can  manage at this time, will call back for any worsening or new symptoms.  Protocols used: Poison Ivy - Oak - [place]-A-AH

## 2024-02-11 NOTE — Telephone Encounter (Signed)
 Patient has an appt 6/20

## 2024-02-14 ENCOUNTER — Ambulatory Visit (INDEPENDENT_AMBULATORY_CARE_PROVIDER_SITE_OTHER): Admitting: Family Medicine

## 2024-02-14 ENCOUNTER — Encounter: Payer: Self-pay | Admitting: Family Medicine

## 2024-02-14 VITALS — BP 112/60 | HR 71 | Temp 97.9°F | Wt 252.0 lb

## 2024-02-14 DIAGNOSIS — F411 Generalized anxiety disorder: Secondary | ICD-10-CM | POA: Diagnosis not present

## 2024-02-14 DIAGNOSIS — F321 Major depressive disorder, single episode, moderate: Secondary | ICD-10-CM | POA: Diagnosis not present

## 2024-02-14 DIAGNOSIS — L255 Unspecified contact dermatitis due to plants, except food: Secondary | ICD-10-CM

## 2024-02-14 MED ORDER — PREDNISONE 10 MG PO TABS: ORAL_TABLET | ORAL | 0 refills | Status: DC

## 2024-02-14 NOTE — Progress Notes (Signed)
 Established Patient Office Visit   Subjective  Patient ID: Kevin Andrade, male    DOB: 11-26-1970  Age: 53 y.o. MRN: [id number]  Chief Complaint  Patient presents with   Poison Ivy    Both legs; x1-2wks; been using charcoal soap and calamine lotion; been helping with the itching    Patient is a 53 year old male seen for acute concern.  Patient endorses coming into contact with poison ivy while at work 1.5 weeks ago.  Patient is a Radiographer, therapeutic which requires climbing poles.  Patient thought he cleared most of the poisonous vines from the pole.  Had contact with bilateral LEs and a small spot on RUE.Kevin Andrade  Patient tried using charcoal soap, calamine lotion which helped some with the itching.  Rash still present.  Patient states typically requires steroids.  Patient endorses some life stress.  States has good days and bad days.  Taking Trintellix , Abilify .  Patient tries to stay busy which is easy at work to keep his mind off of things.  At home he started a large garden with 32 tomato plants, zucchini, okra, and snap peas.  Poison Kevin Andrade This is a new problem. The current episode started 1 to 4 weeks ago. The problem is unchanged. The affected locations include the left lower leg, right lower leg and right arm. The rash is characterized by itchiness and redness. He was exposed to plant contact.    Patient Active Problem List   Diagnosis Date Noted   Hx of adenomatous and sessile serrated colonic polyps 06/14/2023   Excessive daytime sleepiness 02/14/2022   Chronic fatigue 02/14/2022   Sleep behavior disorder, REM 02/14/2022   Snoring 02/14/2022   Sleep paralysis, recurrent isolated 02/14/2022   Tobacco use disorder    Multinodular goiter 08/11/2014   Insomnia with sleep apnea    Hypersomnia, persistent    Insomnia 11/23/2012   Nicotine addiction 10/18/2012   GAD (generalized anxiety disorder) 10/18/2012   Past Medical History:  Diagnosis Date   Anxiety    Depression    Hx of  adenomatous and sessile serrated colonic polyps 06/14/2023   Hypersomnia, persistent    Insomnia with sleep apnea    Sleep apnea    Substance abuse ([name])    Tobacco use disorder    Past Surgical History:  Procedure Laterality Date   AMPUTATION Right 12/18/2018   Procedure: RIGHT DISTAL PHALANX INDEX FINGER AMPUTATION;  Surgeon: Brunilda Capra, MD;  Location: [facility];  Service: Orthopedics;  Laterality: Right;   COLONOSCOPY W/ POLYPECTOMY     KNEE SURGERY Left    Social History   Tobacco Use   Smoking status: Every Day    Current packs/day: 0.00    Average packs/day: 2.0 packs/day for 20.0 years (40.0 ttl pk-yrs)    Types: Cigarettes    Start date: 01/04/2002    Last attempt to quit: 01/04/2022    Years since quitting: 2.1   Smokeless tobacco: Former  Building services engineer status: Never Used  Substance Use Topics   Alcohol  use: No   Drug use: No   Family History  Problem Relation Age of Onset   Diabetes Mother    Arthritis Mother    Cancer Father        lymphoma   Hyperlipidemia Father    Alcohol  abuse Father    Arthritis Father    Depression Father    Diabetes Father    Stroke Father    Colon cancer Neg Hx  Esophageal cancer Neg Hx    Rectal cancer Neg Hx    Stomach cancer Neg Hx    Colon polyps Neg Hx    No Known Allergies  ROS Negative unless stated above    Objective:     BP 112/60   Pulse 71   Temp 97.9 F (36.6 C)   Wt 252 lb (114.3 kg)   SpO2 95%   BMI 33.25 kg/m  BP Readings from Last 3 Encounters:  02/14/24 112/60  10/02/23 110/70  09/20/23 104/62   Wt Readings from Last 3 Encounters:  02/14/24 252 lb (114.3 kg)  10/02/23 243 lb 8 oz (110.5 kg)  09/20/23 245 lb (111.1 kg)      Physical Exam Constitutional:      General: He is not in acute distress.    Appearance: Normal appearance.  HENT:     Head: Normocephalic and atraumatic.     Nose: Nose normal.     Mouth/Throat:     Mouth: Mucous membranes are moist.    Cardiovascular:     Rate and Rhythm: Normal rate and regular rhythm.     Heart sounds: Normal heart sounds. No murmur heard.    No gallop.  Pulmonary:     Effort: Pulmonary effort is normal. No respiratory distress.     Breath sounds: Normal breath sounds. No wheezing, rhonchi or rales.   Musculoskeletal:     Comments: Surgically absent right second digit distal phalanx of hand.   Skin:    General: Skin is warm and dry.     Comments: Erythematous papules in linear distribution of bilateral lower LEs and right forearm.  No pustules noted.   Neurological:     Mental Status: He is alert and oriented to person, place, and time.        02/14/2024    8:36 AM 10/02/2023    7:01 AM 05/03/2023   10:08 AM  Depression screen PHQ 2/9  Decreased Interest 2 2 2   Down, Depressed, Hopeless 2 2 2   PHQ - 2 Score 4 4 4   Altered sleeping 2 2 2   Tired, decreased energy 2 1 1   Change in appetite 1 1 1   Feeling bad or failure about yourself  2 2 2   Trouble concentrating 2 2 2   Moving slowly or fidgety/restless 1 0 1  Suicidal thoughts 1 0 0  PHQ-9 Score 15 12 13   Difficult doing work/chores Somewhat difficult  Somewhat difficult      02/14/2024    8:37 AM 05/03/2023   10:09 AM 02/28/2022    9:35 AM 04/20/2021    8:13 AM  GAD 7 : Generalized Anxiety Score  Nervous, Anxious, on Edge 1 2 2 2   Control/stop worrying 2 2 2 3   Worry too much - different things 1 2 2 3   Trouble relaxing 2 2 2 2   Restless 2 1 1 1   Easily annoyed or irritable 2 2 3 3   Afraid - awful might happen 2 1 1 2   Total GAD 7 Score 12 12 13 16   Anxiety Difficulty Somewhat difficult Somewhat difficult Somewhat difficult Very difficult     No results found for any visits on 02/14/24.    Assessment & Plan:   Toxicodendron dermatitis -     predniSONE; Take 6 tabs every morning for 3 days, 5 tabs for 3 days, 4 tabs for 2 days, 3 tabs for 2 days, 2 tabs for 2 days, 1 tab for 2 days.  Dispense: 53 tablet;  Refill:  0  Depression, major, single episode, moderate ([name])  GAD (generalized anxiety disorder)  Contact with Toxicodendron causing dermatitis.  Start extended prednisone taper to prevent rebound symptoms.  Continue supportive care with topical antihistamines.  Increased depression symptoms as PHQ-9 score 15 this visit.  Previously 12.  GAD-7 score remains at 12.  Discussed self-care, counseling, avoiding substances.  Continue Abilify  10 mg and Trintellix  20 mg daily.  Continue follow-up with BH.  Return if symptoms worsen or fail to improve.   Viola Greulich, MD

## 2024-03-05 ENCOUNTER — Telehealth (HOSPITAL_COMMUNITY): Payer: BC Managed Care – PPO | Admitting: Medical

## 2024-03-05 ENCOUNTER — Encounter (HOSPITAL_COMMUNITY): Payer: Self-pay | Admitting: Medical

## 2024-03-05 DIAGNOSIS — F32A Depression, unspecified: Secondary | ICD-10-CM

## 2024-03-05 DIAGNOSIS — T7492XS Unspecified child maltreatment, confirmed, sequela: Secondary | ICD-10-CM

## 2024-03-05 DIAGNOSIS — F419 Anxiety disorder, unspecified: Secondary | ICD-10-CM | POA: Diagnosis not present

## 2024-03-05 DIAGNOSIS — F4312 Post-traumatic stress disorder, chronic: Secondary | ICD-10-CM

## 2024-03-05 DIAGNOSIS — Z62819 Personal history of unspecified abuse in childhood: Secondary | ICD-10-CM

## 2024-03-05 DIAGNOSIS — G4700 Insomnia, unspecified: Secondary | ICD-10-CM

## 2024-03-05 DIAGNOSIS — F341 Dysthymic disorder: Secondary | ICD-10-CM

## 2024-03-05 DIAGNOSIS — F1121 Opioid dependence, in remission: Secondary | ICD-10-CM | POA: Diagnosis not present

## 2024-03-05 DIAGNOSIS — E66811 Obesity, class 1: Secondary | ICD-10-CM

## 2024-03-05 DIAGNOSIS — Z811 Family history of alcohol abuse and dependence: Secondary | ICD-10-CM

## 2024-03-05 DIAGNOSIS — F1421 Cocaine dependence, in remission: Secondary | ICD-10-CM

## 2024-03-05 DIAGNOSIS — F17219 Nicotine dependence, cigarettes, with unspecified nicotine-induced disorders: Secondary | ICD-10-CM

## 2024-03-05 DIAGNOSIS — G4730 Sleep apnea, unspecified: Secondary | ICD-10-CM

## 2024-03-05 DIAGNOSIS — Z6831 Body mass index (BMI) 31.0-31.9, adult: Secondary | ICD-10-CM

## 2024-03-05 DIAGNOSIS — F1021 Alcohol dependence, in remission: Secondary | ICD-10-CM

## 2024-03-05 DIAGNOSIS — Z860101 Personal history of adenomatous and serrated colon polyps: Secondary | ICD-10-CM

## 2024-03-05 MED ORDER — ARIPIPRAZOLE 10 MG PO TABS: 10.0000 mg | ORAL_TABLET | Freq: Every day | ORAL | 1 refills | Status: DC

## 2024-03-05 MED ORDER — TRINTELLIX 20 MG PO TABS: ORAL_TABLET | ORAL | 1 refills | Status: DC

## 2024-03-05 MED ORDER — PREGABALIN 150 MG PO CAPS: 150.0000 mg | ORAL_CAPSULE | Freq: Three times a day (TID) | ORAL | 1 refills | Status: DC

## 2024-03-05 MED ORDER — NALTREXONE HCL 50 MG PO TABS: 50.0000 mg | ORAL_TABLET | Freq: Every day | ORAL | 1 refills | Status: DC
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
## 2024-03-19 ENCOUNTER — Encounter: Payer: Self-pay | Admitting: Family Medicine

## 2024-04-06 ENCOUNTER — Encounter (HOSPITAL_COMMUNITY): Payer: Self-pay | Admitting: Internal Medicine

## 2024-04-06 NOTE — Progress Notes (Signed)
 Attempted to obtain medical history for pre op call via telephone, unable to reach at this time. HIPAA compliant voicemail message left requesting return call to pre surgical testing department.

## 2024-04-08 ENCOUNTER — Telehealth: Payer: Self-pay | Admitting: Gastroenterology

## 2024-04-08 NOTE — Telephone Encounter (Signed)
 Procedure:Colonoscopy Procedure date: 04/16/24 Procedure location: [facility] Arrival Time: 6:00 am Spoke with the patient Y/N: Yes Any prep concerns? No  Has the patient obtained the prep from the pharmacy ? Yes Do you have a care partner and transportation: Yes Any additional concerns? No

## 2024-04-10 ENCOUNTER — Encounter: Payer: Self-pay | Admitting: Internal Medicine

## 2024-04-15 NOTE — Anesthesia Preprocedure Evaluation (Signed)
 Anesthesia Evaluation  Patient identified by MRN, date of birth, ID band Patient awake    Reviewed: Allergy & Precautions, NPO status , Patient's Chart, lab work & pertinent test results  History of Anesthesia Complications Negative for: history of anesthetic complications  Airway Mallampati: II  TM Distance: >3 FB Neck ROM: Full    Dental no notable dental hx.    Pulmonary sleep apnea and Continuous Positive Airway Pressure Ventilation , Current Smoker   Pulmonary exam normal        Cardiovascular negative cardio ROS Normal cardiovascular exam     Neuro/Psych   Anxiety Depression    negative neurological ROS     GI/Hepatic Neg liver ROS,,,colon polyp   Endo/Other  BMI 33  Renal/GU negative Renal ROS  negative genitourinary   Musculoskeletal negative musculoskeletal ROS (+)    Abdominal   Peds  Hematology negative hematology ROS (+)   Anesthesia Other Findings Day of surgery medications reviewed with patient.  Reproductive/Obstetrics negative OB ROS                              Anesthesia Physical Anesthesia Plan  ASA: 2  Anesthesia Plan: MAC   Post-op Pain Management: Minimal or no pain anticipated   Induction:   PONV Risk Score and Plan: Treatment may vary due to age or medical condition and Propofol  infusion  Airway Management Planned: Natural Airway and Simple Face Mask  Additional Equipment: None  Intra-op Plan:   Post-operative Plan:   Informed Consent: I have reviewed the patients History and Physical, chart, labs and discussed the procedure including the risks, benefits and alternatives for the proposed anesthesia with the patient or authorized representative who has indicated his/her understanding and acceptance.       Plan Discussed with: CRNA  Anesthesia Plan Comments:          Anesthesia Quick Evaluation

## 2024-04-16 ENCOUNTER — Encounter (HOSPITAL_COMMUNITY): Payer: Self-pay

## 2024-04-16 ENCOUNTER — Ambulatory Visit (HOSPITAL_COMMUNITY): Payer: Self-pay | Admitting: Anesthesiology

## 2024-04-16 ENCOUNTER — Other Ambulatory Visit: Payer: Self-pay

## 2024-04-16 ENCOUNTER — Ambulatory Visit (HOSPITAL_COMMUNITY): Admit: 2024-04-16 | Admitting: Internal Medicine

## 2024-04-16 ENCOUNTER — Encounter (HOSPITAL_COMMUNITY): Payer: Self-pay | Admitting: Internal Medicine

## 2024-04-16 ENCOUNTER — Ambulatory Visit (HOSPITAL_COMMUNITY)
Admission: RE | Admit: 2024-04-16 | Discharge: 2024-04-16 | Disposition: A | Discharge status: Home / Self Care | Attending: Internal Medicine | Admitting: Internal Medicine

## 2024-04-16 ENCOUNTER — Encounter (HOSPITAL_COMMUNITY): Admission: RE | Payer: Self-pay | Source: Home / Self Care

## 2024-04-16 DIAGNOSIS — L818 Other specified disorders of pigmentation: Secondary | ICD-10-CM

## 2024-04-16 DIAGNOSIS — Z1211 Encounter for screening for malignant neoplasm of colon: Secondary | ICD-10-CM | POA: Diagnosis not present

## 2024-04-16 DIAGNOSIS — G4730 Sleep apnea, unspecified: Secondary | ICD-10-CM | POA: Diagnosis not present

## 2024-04-16 DIAGNOSIS — F418 Other specified anxiety disorders: Secondary | ICD-10-CM | POA: Diagnosis not present

## 2024-04-16 DIAGNOSIS — Z09 Encounter for follow-up examination after completed treatment for conditions other than malignant neoplasm: Secondary | ICD-10-CM | POA: Diagnosis not present

## 2024-04-16 DIAGNOSIS — Z9889 Other specified postprocedural states: Secondary | ICD-10-CM

## 2024-04-16 DIAGNOSIS — K5520 Angiodysplasia of colon without hemorrhage: Secondary | ICD-10-CM | POA: Insufficient documentation

## 2024-04-16 DIAGNOSIS — F17210 Nicotine dependence, cigarettes, uncomplicated: Secondary | ICD-10-CM | POA: Insufficient documentation

## 2024-04-16 DIAGNOSIS — D122 Benign neoplasm of ascending colon: Secondary | ICD-10-CM | POA: Diagnosis not present

## 2024-04-16 DIAGNOSIS — D123 Benign neoplasm of transverse colon: Secondary | ICD-10-CM | POA: Insufficient documentation

## 2024-04-16 DIAGNOSIS — Z860100 Personal history of colon polyps, unspecified: Secondary | ICD-10-CM | POA: Diagnosis not present

## 2024-04-16 HISTORY — PX: COLONOSCOPY: SHX5424

## 2024-04-16 SURGERY — COLONOSCOPY
Anesthesia: Monitor Anesthesia Care

## 2024-04-16 MED ORDER — PROPOFOL 10 MG/ML IV BOLUS
INTRAVENOUS | Status: DC | PRN
  Administered 2024-04-16: 30 mg via INTRAVENOUS
  Administered 2024-04-16: 20 mg via INTRAVENOUS
  Administered 2024-04-16: 125 ug/kg/min via INTRAVENOUS
  Administered 2024-04-16: 20 mg via INTRAVENOUS

## 2024-04-16 MED ORDER — PROPOFOL 1000 MG/100ML IV EMUL
INTRAVENOUS | Status: AC
  Filled 2024-04-16: qty 100

## 2024-04-16 MED ORDER — SODIUM CHLORIDE 0.9 % IV SOLN: INTRAVENOUS | Status: DC

## 2024-04-16 MED ORDER — PROPOFOL 500 MG/50ML IV EMUL
INTRAVENOUS | Status: AC
  Filled 2024-04-16: qty 50

## 2024-04-16 NOTE — Transfer of Care (Signed)
 Immediate Anesthesia Transfer of Care Note  Patient: Kevin Andrade  Procedure(s) Performed: COLONOSCOPY  Patient Location: PACU and Endoscopy Unit  Anesthesia Type:MAC  Level of Consciousness: awake, alert , and oriented  Airway & Oxygen Therapy: Patient Spontanous Breathing and Patient connected to face mask oxygen  Post-op Assessment: Report given to RN and Post -op Vital signs reviewed and stable  Post vital signs: Reviewed and stable  Last Vitals:  Vitals Value Taken Time  BP    Temp    Pulse    Resp    SpO2      Last Pain:  Vitals:   04/16/24 0646  TempSrc: Temporal  PainSc: 0-No pain         Complications: No notable events documented.

## 2024-04-16 NOTE — H&P (Signed)
 [facility] History and Physical   Primary Care Physician:  Mercer Clotilda SAUNDERS, MD   Reason for Procedure:   Close follow-up of polyp removal  Plan:    colonoscopy     HPI: Kennan Detter is a 53 y.o. male here for close follow-up of colon polyp removal - large adenoma removed and had residual on follow-up 08/2023   Past Medical History:  Diagnosis Date   Anxiety    Depression    Hx of adenomatous and sessile serrated colonic polyps 06/14/2023   Hypersomnia, persistent    Insomnia with sleep apnea    Sleep apnea    Substance abuse ([name])    Tobacco use disorder     Past Surgical History:  Procedure Laterality Date   AMPUTATION Right 12/18/2018   Procedure: RIGHT DISTAL PHALANX INDEX FINGER AMPUTATION;  Surgeon: Murrell Drivers, MD;  Location: [facility];  Service: Orthopedics;  Laterality: Right;   COLONOSCOPY W/ POLYPECTOMY     KNEE SURGERY Left      Current Facility-Administered Medications  Medication Dose Route Frequency Provider Last Rate Last Admin   0.9 %  sodium chloride  infusion   Intravenous Continuous Avram Lupita BRAVO, MD        Allergies as of 01/14/2024   (No Known Allergies)    Family History  Problem Relation Age of Onset   Diabetes Mother    Arthritis Mother    Cancer Father        lymphoma   Hyperlipidemia Father    Alcohol  abuse Father    Arthritis Father    Depression Father    Diabetes Father    Stroke Father    Colon cancer Neg Hx    Esophageal cancer Neg Hx    Rectal cancer Neg Hx    Stomach cancer Neg Hx    Colon polyps Neg Hx     Social History   Socioeconomic History   Marital status: Married    Spouse name: [name]   Number of children: 2   Years of education: HS   Highest education level: GED or equivalent  Occupational History   Not on file  Tobacco Use   Smoking status: Every Day    Current packs/day: 0.00    Average packs/day: 2.0 packs/day for 20.0 years (40.0 ttl pk-yrs)    Types:  Cigarettes    Start date: 01/04/2002    Last attempt to quit: 01/04/2022    Years since quitting: 2.2   Smokeless tobacco: Former  Building services engineer status: Never Used  Substance and Sexual Activity   Alcohol  use: No   Drug use: No   Sexual activity: Yes  Other Topics Concern   Not on file  Social History Narrative   Patient lives at home with spouse.Caffeine Use: 2 L daily   Wife is Dr. Michell CMA [facility])   Social Drivers of Health   Financial Resource Strain: Medium Risk (02/12/2024)   Overall Financial Resource Strain (CARDIA)    Difficulty of Paying Living Expenses: Somewhat hard  Food Insecurity: No Food Insecurity (02/12/2024)   Hunger Vital Sign    Worried About Running Out of Food in the Last Year: Never true    Ran Out of Food in the Last Year: Never true  Transportation Needs: No Transportation Needs (02/12/2024)   PRAPARE - Administrator, Civil Service (Medical): No    Lack of Transportation (Non-Medical): No  Physical Activity: Sufficiently Active (02/12/2024)  Exercise Vital Sign    Days of Exercise per Week: 5 days    Minutes of Exercise per Session: 60 min  Stress: Stress Concern Present (02/12/2024)   [employer] of Occupational Health - Occupational Stress Questionnaire    Feeling of Stress: To some extent  Social Connections: Moderately Isolated (02/12/2024)   Social Connection and Isolation Panel    Frequency of Communication with Friends and Family: Three times a week    Frequency of Social Gatherings with Friends and Family: Once a week    Attends Religious Services: Never    Database administrator or Organizations: No    Attends Engineer, structural: Not on file    Marital Status: Married  Catering manager Violence: Not on file    Review of Systems:  All other review of systems negative except as mentioned in the HPI.  Physical Exam: Vital signs BP (!) 156/78   Pulse 61   Temp 98.4 F (36.9 C) (Temporal)    Resp (!) 24   Ht 6' 1 (1.854 m)   Wt 115.7 kg   SpO2 97%   BMI 33.64 kg/m   General:   Alert,  Well-developed, well-nourished, pleasant and cooperative in NAD Lungs:  Clear throughout to auscultation.   Heart:  Regular rate and rhythm; no murmurs, clicks, rubs,  or gallops. Abdomen:  Soft, nontender and nondistended. Normal bowel sounds.   Neuro/Psych:  Alert and cooperative. Normal mood and affect. A and O x 3   @Alveta Quintela  CHARLENA Commander, MD, [facility] [phone] (pager) 04/16/2024 7:27 AM@

## 2024-04-16 NOTE — Anesthesia Postprocedure Evaluation (Signed)
 Anesthesia Post Note  Patient: Kevin Andrade  Procedure(s) Performed: COLONOSCOPY     Patient location during evaluation: PACU Anesthesia Type: MAC Level of consciousness: awake and alert Pain management: pain level controlled Vital Signs Assessment: post-procedure vital signs reviewed and stable Respiratory status: spontaneous breathing, nonlabored ventilation and respiratory function stable Cardiovascular status: blood pressure returned to baseline Postop Assessment: no apparent nausea or vomiting Anesthetic complications: no   No notable events documented.  Last Vitals:  Vitals:   04/16/24 0815 04/16/24 0820  BP: 136/81 137/82  Pulse: 64 63  Resp: 19 20  Temp:    SpO2: 99% 99%    Last Pain:  Vitals:   04/16/24 0820  TempSrc:   PainSc: 0-No pain                 Vertell Row

## 2024-04-16 NOTE — Op Note (Signed)
 [facility] Patient Name: Kevin Andrade Procedure Date: 04/16/2024 MRN: [id number] Attending MD: Lupita FORBES Commander , MD, [id number] Date of Birth: [birth year] CSN: [id number] Age: 53 Admit Type: Ambulatory Procedure:                Colonoscopy Indications:              Surveillance: Piecemeal removal of large sessile                            adenoma last colonoscopy (< 3 yrs), Last                            colonoscopy: January 2025 Providers:                Lupita CHARLENA Commander, MD, Almarie Masters, RN, Fairy Marina, Technician Referring MD:              Medicines:                Monitored Anesthesia Care Complications:            No immediate complications. Estimated Blood Loss:     Estimated blood loss was minimal. Procedure:                Pre-Anesthesia Assessment:                           - Prior to the procedure, a History and Physical                            was performed, and patient medications and                            allergies were reviewed. The patient's tolerance of                            previous anesthesia was also reviewed. The risks                            and benefits of the procedure and the sedation                            options and risks were discussed with the patient.                            All questions were answered, and informed consent                            was obtained. Prior Anticoagulants: The patient has                            taken no anticoagulant or antiplatelet agents. ASA  Grade Assessment: II - A patient with mild systemic                            disease. After reviewing the risks and benefits,                            the patient was deemed in satisfactory condition to                            undergo the procedure.                           After obtaining informed consent, the colonoscope                            was passed under  direct vision. Throughout the                            procedure, the patient's blood pressure, pulse, and                            oxygen saturations were monitored continuously. The                            CF-HQ190L ([id number]) Olympus colonoscope was                            introduced through the anus and advanced to the the                            cecum, identified by appendiceal orifice and                            ileocecal valve. The colonoscopy was performed                            without difficulty. The patient tolerated the                            procedure well. The quality of the bowel                            preparation was adequate. The bowel preparation                            used was Miralax via split dose instruction. The                            ileocecal valve, appendiceal orifice, and rectum                            were photographed. Scope In: 7:38:39 AM Scope Out: 7:58:17 AM Scope Withdrawal Time: 0 hours 17 minutes 39 seconds  Total Procedure Duration: 0 hours 19 minutes 38 seconds  Findings:      The perianal and digital rectal examinations were normal.      Two sessile polyps were found in the proximal ascending colon. The       polyps were 2 to 5 mm in size. These polyps were removed with a cold       snare. Resection and retrieval were complete. Verification of patient       identification for the specimen was done. Estimated blood loss was       minimal.      A tattoo was seen in the distal ascending colon. A post-polypectomy scar       was found at the tattoo site. There was no evidence of residual polyp       tissue.      A 3 mm polyp was found in the distal transverse colon. The polyp was       sessile. The polyp was removed with a cold snare. Resection and       retrieval were complete. Verification of patient identification for the       specimen was done. Estimated blood loss was minimal.      A single small  angiodysplastic lesion without bleeding was found at the       appendiceal orifice.      The exam was otherwise without abnormality on direct and retroflexion       views. Impression:               - Two 2 to 5 mm polyps in the proximal ascending                            colon, removed with a cold snare. Resected and                            retrieved.                           - A tattoo was seen in the distal ascending colon.                            A post-polypectomy scar was found at the tattoo                            site. There was no evidence of residual polyp                            tissue.                           - One 3 mm polyp in the distal transverse colon,                            removed with a cold snare. Resected and retrieved.                           - A single non-bleeding colonic angiodysplastic                            lesion.                           -  The examination was otherwise normal on direct                            and retroflexion views.                           - Personal history of colonic polyps.25 mm distal                            ascending TV adenoma w/ high-grade dysplasia                            removed October 2024, 08/2023 2 adenomas in vicinity                            removed, base was negative for polyp, also multiple                            other adenomas and ssp's in past Moderate Sedation:      Not Applicable - Patient had care per Anesthesia. Recommendation:           - Patient has a contact number available for                            emergencies. The signs and symptoms of potential                            delayed complications were discussed with the                            patient. Return to normal activities tomorrow.                            Written discharge instructions were provided to the                            patient.                           - Resume previous diet.                            - Continue present medications.                           - Repeat colonoscopy is recommended for                            surveillance. The colonoscopy date will be                            determined after pathology results from today's                            exam become available for review. Procedure Code(s):        ---  Professional ---                           [phone], Colonoscopy, flexible; with removal of                            tumor(s), polyp(s), or other lesion(s) by snare                            technique Diagnosis Code(s):        --- Professional ---                           Z86.010, Personal history of colonic polyps                           D12.2, Benign neoplasm of ascending colon                           D12.3, Benign neoplasm of transverse colon (hepatic                            flexure or splenic flexure)                           K55.20, Angiodysplasia of colon without hemorrhage CPT copyright 2022 American Medical Association. All rights reserved. The codes documented in this report are preliminary and upon coder review may  be revised to meet current compliance requirements. Lupita FORBES Commander, MD 04/16/2024 8:16:58 AM This report has been signed electronically. Number of Addenda: 0

## 2024-04-16 NOTE — Discharge Instructions (Addendum)
 I did not see any remaining polyp where the large one was removed. There were another three tiny polyps found and removed.  I will let you know pathology results and when to have another routine colonoscopy by mail and/or My Chart.  I appreciate the opportunity to care for you. Lupita CHARLENA Commander, MD, FACG  YOU HAD AN ENDOSCOPIC PROCEDURE TODAY: Refer to the procedure report and other information in the discharge instructions given to you for any specific questions about what was found during the examination. If this information does not answer your questions, please call Dr. Darilyn office at [phone] to clarify.   YOU SHOULD EXPECT: Some feelings of bloating in the abdomen. Passage of more gas than usual. Walking can help get rid of the air that was put into your GI tract during the procedure and reduce the bloating. If you had a lower endoscopy (such as a colonoscopy or flexible sigmoidoscopy) you may notice spotting of blood in your stool or on the toilet paper. Some abdominal soreness may be present for a day or two, also.  DIET: Your first meal following the procedure should be a light meal and then it is ok to progress to your normal diet. A half-sandwich or bowl of soup is an example of a good first meal. Heavy or fried foods are harder to digest and may make you feel nauseous or bloated. Drink plenty of fluids but you should avoid alcoholic beverages for 24 hours.   ACTIVITY: Your care partner should take you home directly after the procedure. You should plan to take it easy, moving slowly for the rest of the day. You can resume normal activity the day after the procedure however YOU SHOULD NOT DRIVE, use power tools, machinery or perform tasks that involve climbing or major physical exertion for 24 hours (because of the sedation medicines used during the test).   SYMPTOMS TO REPORT IMMEDIATELY: A gastroenterologist can be reached at any hour. Please call [phone]  for any of the  following symptoms:  Following lower endoscopy (colonoscopy, flexible sigmoidoscopy) Excessive amounts of blood in the stool  Significant tenderness, worsening of abdominal pains  Swelling of the abdomen that is new, acute  Fever of 100 or higher  Following upper endoscopy (EGD, EUS, ERCP, esophageal dilation) Vomiting of blood or coffee ground material  New, significant abdominal pain  New, significant chest pain or pain under the shoulder blades  Painful or persistently difficult swallowing  New shortness of breath  Black, tarry-looking or red, bloody stools  FOLLOW UP:  If any biopsies were taken you will be contacted by phone or by letter within the next 1-3 weeks. Call [phone]  if you have not heard about the biopsies in 3 weeks.  Please also call with any specific questions about appointments or follow up tests.

## 2024-04-17 LAB — SURGICAL PATHOLOGY

## 2024-04-18 ENCOUNTER — Ambulatory Visit: Payer: Self-pay | Admitting: Internal Medicine

## 2024-04-18 DIAGNOSIS — Z860101 Personal history of adenomatous and serrated colon polyps: Secondary | ICD-10-CM

## 2024-04-19 ENCOUNTER — Encounter (HOSPITAL_COMMUNITY): Payer: Self-pay | Admitting: Internal Medicine

## 2024-05-25 DIAGNOSIS — G4733 Obstructive sleep apnea (adult) (pediatric): Secondary | ICD-10-CM | POA: Diagnosis not present

## 2024-05-26 NOTE — Progress Notes (Unsigned)
 [facility] [address] [place]. [place] [address] [phone]       OFFICE FOLLOW UP NOTE  Mr. Kevin Andrade Date of Birth:  [birth year] Medical Record Number:  [id number]    Primary neurologist: Dr. Chalice Reason for visit: CPAP follow-up   Virtual Visit via Video Note  I connected with Kevin Andrade on 05/27/24 at 11:30 AM EDT by a video enabled telemedicine application and verified that I am speaking with the correct person using two identifiers.  Location: Patient: in parked work truck, in [employer]: in office, [facility]   I discussed the limitations of evaluation and management by telemedicine and the availability of in person appointments. The patient expressed understanding and agreed to proceed.     SUBJECTIVE:   Follow-up visit:  Prior visit: 06/12/2023  Brief HPI:   Kevin Andrade is a 53 y.o. male who was evaluated by Dr. Chalice on 02/14/2022 for concern of underlying sleep apnea with complaints of daytime fatigue, insomnia, and witnessed apneas.  ESS 20/24. FSS 61/63.  HST 03/27/2022 showed mild to moderate sleep apnea with total AHI 15.5/h and REM AHI 34/h.  AutoPap set up date 04/12/2022. DME [facility].     Interval history:  Patient returns for yearly CPAP follow-up visit. Compliance report as below showing excellent usage and optimal residual AHI.  Reports tolerating CPAP well, use of FFM.  Reports continued benefit with CPAP therapy.  Does have some leaks due to facial hair, will resolve after repositioning mask, no overly bothersome. Up to date on supplies. No questions or concerns today.          ROS:   14 system review of systems performed and negative with exception of those listed in HPI  PMH:  Past Medical History:  Diagnosis Date   Anxiety    Depression    Hx of adenomatous and sessile serrated colonic polyps 06/14/2023   Hypersomnia, persistent    Insomnia with sleep apnea    Sleep apnea     Substance abuse ([name])    Tobacco use disorder     PSH:  Past Surgical History:  Procedure Laterality Date   AMPUTATION Right 12/18/2018   Procedure: RIGHT DISTAL PHALANX INDEX FINGER AMPUTATION;  Surgeon: Murrell Drivers, MD;  Location: [facility];  Service: Orthopedics;  Laterality: Right;   COLONOSCOPY N/A 04/16/2024   Procedure: COLONOSCOPY;  Surgeon: Avram Lupita BRAVO, MD;  Location: [facility];  Service: Gastroenterology;  Laterality: N/A;   COLONOSCOPY W/ POLYPECTOMY     KNEE SURGERY Left     Social History:  Social History   Socioeconomic History   Marital status: Married    Spouse name: Kevin Andrade   Number of children: 2   Years of education: HS   Highest education level: GED or equivalent  Occupational History   Not on file  Tobacco Use   Smoking status: Every Day    Current packs/day: 0.00    Average packs/day: 2.0 packs/day for 20.0 years (40.0 ttl pk-yrs)    Types: Cigarettes    Start date: 01/04/2002    Last attempt to quit: 01/04/2022    Years since quitting: 2.3   Smokeless tobacco: Former  Building services engineer status: Never Used  Substance and Sexual Activity   Alcohol  use: No   Drug use: No   Sexual activity: Yes  Other Topics Concern   Not on file  Social History Narrative   Patient lives at home with spouse.Caffeine Use:  2 L daily   Wife is Dr. Michell CMA [facility])   Social Drivers of Health   Financial Resource Strain: Medium Risk (02/12/2024)   Overall Financial Resource Strain (CARDIA)    Difficulty of Paying Living Expenses: Somewhat hard  Food Insecurity: No Food Insecurity (02/12/2024)   Hunger Vital Sign    Worried About Running Out of Food in the Last Year: Never true    Ran Out of Food in the Last Year: Never true  Transportation Needs: No Transportation Needs (02/12/2024)   PRAPARE - Administrator, Civil Service (Medical): No    Lack of Transportation (Non-Medical): No  Physical Activity: Sufficiently Active  (02/12/2024)   Exercise Vital Sign    Days of Exercise per Week: 5 days    Minutes of Exercise per Session: 60 min  Stress: Stress Concern Present (02/12/2024)   [employer] of Occupational Health - Occupational Stress Questionnaire    Feeling of Stress: To some extent  Social Connections: Moderately Isolated (02/12/2024)   Social Connection and Isolation Panel    Frequency of Communication with Friends and Family: Three times a week    Frequency of Social Gatherings with Friends and Family: Once a week    Attends Religious Services: Never    Database administrator or Organizations: No    Attends Engineer, structural: Not on file    Marital Status: Married  Catering manager Violence: Not on file    Family History:  Family History  Problem Relation Age of Onset   Diabetes Mother    Arthritis Mother    Cancer Father        lymphoma   Hyperlipidemia Father    Alcohol  abuse Father    Arthritis Father    Depression Father    Diabetes Father    Stroke Father    Colon cancer Neg Hx    Esophageal cancer Neg Hx    Rectal cancer Neg Hx    Stomach cancer Neg Hx    Colon polyps Neg Hx     Medications:   Current Outpatient Medications on File Prior to Visit  Medication Sig Dispense Refill   albuterol  (VENTOLIN  HFA) 108 (90 Base) MCG/ACT inhaler Inhale 2 puffs into the lungs every 6 (six) hours as needed for wheezing or shortness of breath. 8 g 2   ARIPiprazole  (ABILIFY ) 10 MG tablet Take 1 tablet (10 mg total) by mouth daily. 90 tablet 1   ibuprofen  (ADVIL ) 200 MG tablet Take 200 mg by mouth every 6 (six) hours as needed.     ibuprofen  (ADVIL ) 600 MG tablet Take 1 tablet (600 mg total) by mouth every 6 (six) hours as needed for up to 30 doses for mild pain or moderate pain. 30 tablet 0   naltrexone  (DEPADE) 50 MG tablet Take 1 tablet (50 mg total) by mouth daily. 90 tablet 1   pregabalin  (LYRICA ) 150 MG capsule Take 1 capsule (150 mg total) by mouth in the morning, at  noon, and at bedtime. 270 capsule 1   TRINTELLIX  20 MG TABS tablet TAKE 1 TABLET BY MOUTH EVERY DAY 270 tablet 1   Current Facility-Administered Medications on File Prior to Visit  Medication Dose Route Frequency Provider Last Rate Last Admin   0.9 %  sodium chloride  infusion  500 mL Intravenous Once Avram Lupita BRAVO, MD        Allergies:  No Known Allergies    OBJECTIVE:  Physical Exam  General: well developed,  well nourished, very pleasant middle-age Caucasian male, seated, in no evident distress  Neurologic Exam Mental Status: Awake and fully alert. Oriented to place and time. Recent and remote memory intact. Attention span, concentration and fund of knowledge appropriate. Mood and affect appropriate.       ASSESSMENT/PLAN: Kevin Andrade is a 53 y.o. year old male    OSA on CPAP :  Compliance report shows satisfactory usage with optimal residual AHI.   Continue current pressure settings of 6-18 with EPR 3 Discussed continued nightly usage with ensuring greater than 4 hours nightly for optimal benefit and per insurance purposes.   Continue to follow with DME company [facility] for any needed supplies or CPAP related concerns AutoPap initiated 03/2022     Follow up in 1 year via MyChart video visit or call earlier if needed   CC:  PCP: Mercer Clotilda SAUNDERS, MD     I personally spent a total of 15 minutes in the care of the patient today including preparing to see the patient, counseling and educating, placing orders, and documenting clinical information in the EHR.    Harlene Bogaert, AGNP-BC  [facility] [address] [place], [place] [address]  Phone [phone] Fax [phone] Note: This document was prepared with digital dictation and possible smart phrase technology. Any transcriptional errors that result from this process are unintentional.

## 2024-05-27 ENCOUNTER — Encounter: Payer: Self-pay | Admitting: Adult Health

## 2024-05-27 ENCOUNTER — Telehealth: Payer: BC Managed Care – PPO | Admitting: Adult Health

## 2024-05-27 DIAGNOSIS — G4733 Obstructive sleep apnea (adult) (pediatric): Secondary | ICD-10-CM

## 2024-05-28 NOTE — Progress Notes (Signed)
 Community message has been sent to [facility]  for pressure and supplies on 05/28/24. DD

## 2024-08-26 DIAGNOSIS — G4733 Obstructive sleep apnea (adult) (pediatric): Secondary | ICD-10-CM | POA: Diagnosis not present

## 2024-09-03 ENCOUNTER — Telehealth (HOSPITAL_COMMUNITY): Admitting: Medical

## 2024-09-03 ENCOUNTER — Encounter (HOSPITAL_COMMUNITY): Payer: Self-pay | Admitting: Medical

## 2024-09-03 DIAGNOSIS — F419 Anxiety disorder, unspecified: Secondary | ICD-10-CM | POA: Diagnosis not present

## 2024-09-03 DIAGNOSIS — F1121 Opioid dependence, in remission: Secondary | ICD-10-CM | POA: Diagnosis not present

## 2024-09-03 DIAGNOSIS — F1021 Alcohol dependence, in remission: Secondary | ICD-10-CM | POA: Diagnosis not present

## 2024-09-03 DIAGNOSIS — F341 Dysthymic disorder: Secondary | ICD-10-CM | POA: Diagnosis not present

## 2024-09-03 DIAGNOSIS — Z639 Problem related to primary support group, unspecified: Secondary | ICD-10-CM

## 2024-09-03 DIAGNOSIS — E66811 Obesity, class 1: Secondary | ICD-10-CM

## 2024-09-03 DIAGNOSIS — F4312 Post-traumatic stress disorder, chronic: Secondary | ICD-10-CM | POA: Diagnosis not present

## 2024-09-03 DIAGNOSIS — G4700 Insomnia, unspecified: Secondary | ICD-10-CM

## 2024-09-03 DIAGNOSIS — T7492XS Unspecified child maltreatment, confirmed, sequela: Secondary | ICD-10-CM

## 2024-09-03 DIAGNOSIS — F1421 Cocaine dependence, in remission: Secondary | ICD-10-CM | POA: Diagnosis not present

## 2024-09-03 DIAGNOSIS — Z6379 Other stressful life events affecting family and household: Secondary | ICD-10-CM

## 2024-09-03 DIAGNOSIS — F17200 Nicotine dependence, unspecified, uncomplicated: Secondary | ICD-10-CM

## 2024-09-03 DIAGNOSIS — Z811 Family history of alcohol abuse and dependence: Secondary | ICD-10-CM | POA: Diagnosis not present

## 2024-09-03 MED ORDER — NALTREXONE HCL 50 MG PO TABS: 50.0000 mg | ORAL_TABLET | Freq: Every day | ORAL | 1 refills | Status: AC

## 2024-09-03 MED ORDER — TRINTELLIX 20 MG PO TABS: ORAL_TABLET | ORAL | 1 refills | Status: AC

## 2024-09-03 MED ORDER — ARIPIPRAZOLE 10 MG PO TABS: 10.0000 mg | ORAL_TABLET | Freq: Every day | ORAL | 1 refills | Status: AC

## 2024-09-03 MED ORDER — PREGABALIN 150 MG PO CAPS: 150.0000 mg | ORAL_CAPSULE | Freq: Three times a day (TID) | ORAL | 1 refills | Status: AC
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
## 2025-03-04 ENCOUNTER — Telehealth (HOSPITAL_COMMUNITY): Admitting: Medical
